# Patient Record
Sex: Female | Born: 1950 | Race: Black or African American | Hispanic: No | Marital: Single | Smoking: Current every day smoker
Health system: Southern US, Community
[De-identification: ages and names within clinical notes are randomized; demographics above are authoritative.]

## PROBLEM LIST (undated history)

## (undated) DIAGNOSIS — Z720 Tobacco use: Secondary | ICD-10-CM

## (undated) DIAGNOSIS — K219 Gastro-esophageal reflux disease without esophagitis: Secondary | ICD-10-CM

## (undated) DIAGNOSIS — E785 Hyperlipidemia, unspecified: Secondary | ICD-10-CM

## (undated) DIAGNOSIS — J45909 Unspecified asthma, uncomplicated: Secondary | ICD-10-CM

## (undated) DIAGNOSIS — I10 Essential (primary) hypertension: Secondary | ICD-10-CM

## (undated) DIAGNOSIS — I82409 Acute embolism and thrombosis of unspecified deep veins of unspecified lower extremity: Secondary | ICD-10-CM

## (undated) DIAGNOSIS — I2510 Atherosclerotic heart disease of native coronary artery without angina pectoris: Secondary | ICD-10-CM

## (undated) DIAGNOSIS — J449 Chronic obstructive pulmonary disease, unspecified: Secondary | ICD-10-CM

## (undated) HISTORY — DX: Hyperlipidemia, unspecified: E78.5

## (undated) HISTORY — PX: ABDOMINAL SURGERY: SHX537

## (undated) HISTORY — DX: Unspecified asthma, uncomplicated: J45.909

## (undated) HISTORY — PX: CARPAL TUNNEL RELEASE: SHX101

## (undated) HISTORY — DX: Acute embolism and thrombosis of unspecified deep veins of unspecified lower extremity: I82.409

## (undated) HISTORY — DX: Gastro-esophageal reflux disease without esophagitis: K21.9

---

## 1997-12-15 ENCOUNTER — Ambulatory Visit (HOSPITAL_BASED_OUTPATIENT_CLINIC_OR_DEPARTMENT_OTHER): Admission: RE | Admit: 1997-12-15 | Discharge: 1997-12-15 | Payer: Self-pay | Admitting: Orthopedic Surgery

## 1998-11-17 ENCOUNTER — Ambulatory Visit (HOSPITAL_BASED_OUTPATIENT_CLINIC_OR_DEPARTMENT_OTHER): Admission: RE | Admit: 1998-11-17 | Discharge: 1998-11-17 | Payer: Self-pay | Admitting: Orthopedic Surgery

## 2000-04-24 ENCOUNTER — Other Ambulatory Visit: Admission: RE | Admit: 2000-04-24 | Discharge: 2000-04-24 | Payer: Self-pay | Admitting: *Deleted

## 2000-06-17 ENCOUNTER — Encounter (HOSPITAL_BASED_OUTPATIENT_CLINIC_OR_DEPARTMENT_OTHER): Payer: Self-pay | Admitting: General Surgery

## 2000-06-19 ENCOUNTER — Encounter (INDEPENDENT_AMBULATORY_CARE_PROVIDER_SITE_OTHER): Payer: Self-pay | Admitting: Specialist

## 2000-06-19 ENCOUNTER — Ambulatory Visit (HOSPITAL_COMMUNITY): Admission: RE | Admit: 2000-06-19 | Discharge: 2000-06-19 | Payer: Self-pay | Admitting: General Surgery

## 2001-12-30 ENCOUNTER — Other Ambulatory Visit: Admission: RE | Admit: 2001-12-30 | Discharge: 2001-12-30 | Payer: Self-pay | Admitting: *Deleted

## 2002-01-01 ENCOUNTER — Encounter: Payer: Self-pay | Admitting: *Deleted

## 2002-01-01 ENCOUNTER — Encounter: Admission: RE | Admit: 2002-01-01 | Discharge: 2002-01-01 | Payer: Self-pay | Admitting: *Deleted

## 2002-01-14 ENCOUNTER — Encounter: Admission: RE | Admit: 2002-01-14 | Discharge: 2002-04-14 | Payer: Self-pay | Admitting: *Deleted

## 2004-11-26 ENCOUNTER — Ambulatory Visit: Payer: Self-pay | Admitting: Cardiology

## 2004-11-26 ENCOUNTER — Inpatient Hospital Stay (HOSPITAL_COMMUNITY): Admission: EM | Admit: 2004-11-26 | Discharge: 2004-11-28 | Payer: Self-pay | Admitting: Emergency Medicine

## 2004-11-27 ENCOUNTER — Encounter: Payer: Self-pay | Admitting: Cardiology

## 2005-04-14 ENCOUNTER — Emergency Department (HOSPITAL_COMMUNITY): Admission: EM | Admit: 2005-04-14 | Discharge: 2005-04-14 | Payer: Self-pay | Admitting: Family Medicine

## 2005-11-06 ENCOUNTER — Emergency Department (HOSPITAL_COMMUNITY): Admission: EM | Admit: 2005-11-06 | Discharge: 2005-11-06 | Payer: Self-pay | Admitting: Family Medicine

## 2007-01-03 ENCOUNTER — Encounter: Admission: RE | Admit: 2007-01-03 | Discharge: 2007-01-03 | Payer: Self-pay | Admitting: Family Medicine

## 2007-02-26 ENCOUNTER — Emergency Department (HOSPITAL_COMMUNITY): Admission: EM | Admit: 2007-02-26 | Discharge: 2007-02-26 | Payer: Self-pay | Admitting: *Deleted

## 2007-03-04 ENCOUNTER — Encounter: Admission: RE | Admit: 2007-03-04 | Discharge: 2007-03-04 | Payer: Self-pay | Admitting: Family Medicine

## 2007-03-05 ENCOUNTER — Encounter: Admission: RE | Admit: 2007-03-05 | Discharge: 2007-03-05 | Payer: Self-pay | Admitting: Family Medicine

## 2007-06-25 ENCOUNTER — Encounter: Admission: RE | Admit: 2007-06-25 | Discharge: 2007-06-25 | Payer: Self-pay | Admitting: Occupational Medicine

## 2007-08-03 ENCOUNTER — Emergency Department (HOSPITAL_COMMUNITY): Admission: EM | Admit: 2007-08-03 | Discharge: 2007-08-04 | Payer: Self-pay | Admitting: Emergency Medicine

## 2008-02-09 ENCOUNTER — Emergency Department (HOSPITAL_COMMUNITY): Admission: EM | Admit: 2008-02-09 | Discharge: 2008-02-09 | Payer: Self-pay | Admitting: Emergency Medicine

## 2008-06-10 ENCOUNTER — Emergency Department (HOSPITAL_COMMUNITY): Admission: EM | Admit: 2008-06-10 | Discharge: 2008-06-10 | Payer: Self-pay | Admitting: Emergency Medicine

## 2010-01-16 ENCOUNTER — Emergency Department (HOSPITAL_COMMUNITY): Admission: EM | Admit: 2010-01-16 | Discharge: 2010-01-16 | Payer: Self-pay | Admitting: Family Medicine

## 2010-01-16 ENCOUNTER — Inpatient Hospital Stay (HOSPITAL_COMMUNITY): Admission: EM | Admit: 2010-01-16 | Discharge: 2010-01-26 | Payer: Self-pay | Admitting: Emergency Medicine

## 2010-01-17 ENCOUNTER — Encounter (INDEPENDENT_AMBULATORY_CARE_PROVIDER_SITE_OTHER): Payer: Self-pay

## 2010-02-07 ENCOUNTER — Encounter: Admission: RE | Admit: 2010-02-07 | Discharge: 2010-02-07 | Payer: Self-pay | Admitting: Family Medicine

## 2010-08-20 ENCOUNTER — Encounter: Payer: Self-pay | Admitting: Family Medicine

## 2010-10-15 LAB — BASIC METABOLIC PANEL
BUN: 2 mg/dL — ABNORMAL LOW (ref 6–23)
BUN: 3 mg/dL — ABNORMAL LOW (ref 6–23)
CO2: 25 mEq/L (ref 19–32)
CO2: 28 mEq/L (ref 19–32)
CO2: 28 mEq/L (ref 19–32)
CO2: 32 mEq/L (ref 19–32)
Calcium: 8.3 mg/dL — ABNORMAL LOW (ref 8.4–10.5)
Calcium: 8.6 mg/dL (ref 8.4–10.5)
Calcium: 8.7 mg/dL (ref 8.4–10.5)
Calcium: 9.2 mg/dL (ref 8.4–10.5)
Chloride: 99 mEq/L (ref 96–112)
Creatinine, Ser: 0.71 mg/dL (ref 0.4–1.2)
Creatinine, Ser: 0.79 mg/dL (ref 0.4–1.2)
Creatinine, Ser: 0.8 mg/dL (ref 0.4–1.2)
Creatinine, Ser: 0.91 mg/dL (ref 0.4–1.2)
GFR calc Af Amer: >60 mL/min (ref >60)
GFR calc Af Amer: >60 mL/min (ref >60)
GFR calc Af Amer: >60 mL/min (ref >60)
GFR calc non Af Amer: >60 mL/min (ref >60)
GFR calc non Af Amer: >60 mL/min (ref >60)
GFR calc non Af Amer: >60 mL/min (ref >60)
Glucose, Bld: 102 mg/dL — ABNORMAL HIGH (ref 70–99)
Glucose, Bld: 83 mg/dL (ref 70–99)
Glucose, Bld: 84 mg/dL (ref 70–99)
Glucose, Bld: 95 mg/dL (ref 70–99)
Potassium: 3.2 mEq/L — ABNORMAL LOW (ref 3.5–5.1)
Potassium: 3.9 mEq/L (ref 3.5–5.1)
Sodium: 133 mEq/L — ABNORMAL LOW (ref 135–145)
Sodium: 137 mEq/L (ref 135–145)
Sodium: 139 mEq/L (ref 135–145)
Sodium: 139 mEq/L (ref 135–145)

## 2010-10-15 LAB — DIFFERENTIAL
Basophils Absolute: 0 10*3/uL (ref 0.0–0.1)
Basophils Absolute: 0 10*3/uL (ref 0.0–0.1)
Basophils Relative: 0 % (ref 0–1)
Basophils Relative: 1 % (ref 0–1)
Lymphocytes Relative: 18 % (ref 12–46)
Lymphocytes Relative: 54 % — ABNORMAL HIGH (ref 12–46)
Monocytes Absolute: 0.5 10*3/uL (ref 0.1–1.0)
Monocytes Relative: 8 % (ref 3–12)
Neutro Abs: 2.2 10*3/uL (ref 1.7–7.7)
Neutro Abs: 5.2 10*3/uL (ref 1.7–7.7)
Neutrophils Relative %: 36 % — ABNORMAL LOW (ref 43–77)
Neutrophils Relative %: 81 % — ABNORMAL HIGH (ref 43–77)

## 2010-10-15 LAB — CBC
HCT: 31.7 % — ABNORMAL LOW (ref 36.0–46.0)
HCT: 35.8 % — ABNORMAL LOW (ref 36.0–46.0)
Hemoglobin: 10.8 g/dL — ABNORMAL LOW (ref 12.0–15.0)
Hemoglobin: 11.4 g/dL — ABNORMAL LOW (ref 12.0–15.0)
Hemoglobin: 11.7 g/dL — ABNORMAL LOW (ref 12.0–15.0)
Hemoglobin: 13 g/dL (ref 12.0–15.0)
Hemoglobin: 14.1 g/dL (ref 12.0–15.0)
MCH: 29.1 pg (ref 26.0–34.0)
MCH: 29.3 pg (ref 26.0–34.0)
MCH: 29.6 pg (ref 26.0–34.0)
MCHC: 33.5 g/dL (ref 30.0–36.0)
MCHC: 33.6 g/dL (ref 30.0–36.0)
MCHC: 33.8 g/dL (ref 30.0–36.0)
MCHC: 34 g/dL (ref 30.0–36.0)
MCHC: 34 g/dL (ref 30.0–36.0)
MCV: 86.5 fL (ref 78.0–100.0)
MCV: 87.2 fL (ref 78.0–100.0)
Platelets: 275 10*3/uL (ref 150–400)
Platelets: 397 10*3/uL (ref 150–400)
RBC: 4 MIL/uL (ref 3.87–5.11)
RBC: 4.1 MIL/uL (ref 3.87–5.11)
RBC: 4.46 MIL/uL (ref 3.87–5.11)
RBC: 4.81 MIL/uL (ref 3.87–5.11)
RDW: 13.1 % (ref 11.5–15.5)
RDW: 13.2 % (ref 11.5–15.5)
RDW: 13.9 % (ref 11.5–15.5)

## 2010-10-15 LAB — LACTIC ACID, PLASMA: Lactic Acid, Venous: 1 mmol/L (ref 0.5–2.2)

## 2010-10-15 LAB — POCT URINALYSIS DIP (DEVICE)
Bilirubin Urine: NEGATIVE
Glucose, UA: NEGATIVE mg/dL
Nitrite: NEGATIVE
Urobilinogen, UA: 1 mg/dL (ref 0.0–1.0)

## 2010-10-15 LAB — LIPASE, BLOOD: Lipase: 24 U/L (ref 11–59)

## 2010-10-22 ENCOUNTER — Ambulatory Visit (INDEPENDENT_AMBULATORY_CARE_PROVIDER_SITE_OTHER): Payer: 59

## 2010-10-22 ENCOUNTER — Inpatient Hospital Stay (INDEPENDENT_AMBULATORY_CARE_PROVIDER_SITE_OTHER)
Admission: RE | Admit: 2010-10-22 | Discharge: 2010-10-22 | Disposition: A | Discharge status: Home / Self Care | Payer: 59 | Source: Ambulatory Visit | Attending: Family Medicine | Admitting: Family Medicine

## 2010-10-22 DIAGNOSIS — M1990 Unspecified osteoarthritis, unspecified site: Secondary | ICD-10-CM

## 2010-12-15 NOTE — H&P (Signed)
NAME:  Vanessa Campos, Vanessa Campos NO.:  [id number]   MEDICAL RECORD NO.:  [id number]          PATIENT TYPE:  INP   LOCATION:  1827                         FACILITY:  [facility]   PHYSICIAN:  Gertha Calkin, M.D.DATE OF BIRTH:  1950/09/08   DATE OF ADMISSION:  11/26/2004  DATE OF DISCHARGE:                                HISTORY & PHYSICAL   PRIMARY CARE PHYSICIAN:  Talmadge Coventry, M.D.   This is a pleasant 60 year old African-American female with no known past  medical problems who presents with chest pain that began yesterday afternoon  while attending a birthday party of a friend.  She states the pain was  located between her breasts and radiated underneath and wrapped around into  her scapula areas.  She states they wrapped around bilaterally.  She denies  any nausea, palpitations, diaphoresis, or neck and jaw radiation during  these episodes of chest pain.  The pain lasted approximately 10 to 15  minutes, spontaneously resolved, and was not worsened by activity or  relieved with rest.  The patient denies any previous symptoms and states  that she has had episodes of indigestion, but this was different in quality.  The patient states she had another episode yesterday evening while she was  getting into bed, again relieved spontaneously.  This morning she came in to  be evaluated.   PAST MEDICAL HISTORY:  None other than tobacco ab use.   MEDICATIONS:  None.   ALLERGIES:  No known drug allergies.   FAMILY HISTORY:  Positive for MI in her mom when she was in her 29s.  Her  mother is still alive today.   SOCIAL HISTORY:  The patient is divorced with two children.  She has five  brothers and seven sisters.  No known medical illnesses in her siblings.  She worked in [employer] and states she does smoke tobacco, approximately  a pack a day for 20 years.  No alcohol abuse or illicit drug use per  patient.   REVIEW OF SYSTEMS:  Essentially negative except for  HPI.   PHYSICAL EXAMINATION:  VITAL SIGNS:  Temperature 98.2, blood pressure  126/74, pulse 64, respirations 20, 96% on room air.  GENERAL:  This is an obese African-American female in no acute distress.  HEENT:  Pupils equal, round, and reactive to light.  Muddy sclerae.  Extraocular movements are intact.  Oropharynx is without erythema or  exudate.  Mucous membranes are moist.  NECK:  Supple without mass, JVP, or bruits.  CARDIOVASCULAR:  Regular rate and rhythm.  No murmurs, rubs, or gallops.  CHEST:  Clear to auscultation bilaterally with good air movement.  ABDOMEN:  Obese, soft, nontender, nondistended.  Bowel sounds present.  EXTREMITIES:  Without clubbing, cyanosis, or edema.  NEUROLOGIC:  Cranial nerves II-XII intact.  Muscle strength symmetric in  upper and lower extremities.  Sensation is intact.   LABORATORY DATA AND OTHER STUDIES:  UA negative.  Sodium 137, potassium 3.5,  chloride 105, bicarb 27, glucose 100, BUN 9, creatinine 1.0.  Calcium 8.9.  LFTs are normal.  INR is 0.9, PTT 30.  D-dimer negative.  EKG was significant only for primary A-V block; otherwise, normal sinus  rhythm.  Other intervals are normal, and axis is normal.   Point-of-care markers x 2 negative.   Chest x-ray shows no acute processes and no cardiomegaly.   ASSESSMENT AND PLAN:  1.  Atypical chest pain.  2.  Obesity.  3.  Tobacco abuse.  4.  Gastroesophageal reflux disease.  5.  Deep vein thrombosis prophylaxis.   PLAN:  1.  Will admit to telemetry bed.  2.  Considering she has significant risk factors, will check serial enzymes      and EKGs.  3.  Will also obtain fasting lipid profile, TSH, and place heparin during      this hospitalization.  4.  I am not writing for a 2-D echocardiogram as it is rather atypical, but      if any of her enzymes return back positive, would check a 2-D      echocardiogram.  Also am not tempted to do a 2-D echocardiogram      considering she has no  cardiomegaly on chest x-ray.  5.  For tobacco abuse, will offer tobacco cessation as well as place her on      nicotine patch.  6.  Empiric PPI for possible underlying gastrointestinal-related causes for      her chest pain.  7.  Have empirically started her on beta blocker, aspirin, and morphine      p.r.n., but no heparin at this time.      JD/MEDQ  D:  11/26/2004  T:  11/26/2004  Job:  [id number]   cc:   Talmadge Coventry, M.D.  [address]  [place]  [place] [address]  Fax: [phone]

## 2010-12-15 NOTE — Discharge Summary (Signed)
Vanessa Campos, Vanessa Campos NO.:  [id number]   MEDICAL RECORD NO.:  [id number]          PATIENT TYPE:  INP   LOCATION:  4702                         FACILITY:  [facility]   PHYSICIAN:  Lonia Blood, M.D.      DATE OF BIRTH:  Apr 08, 1951   DATE OF ADMISSION:  11/26/2004  DATE OF DISCHARGE:  11/28/2004                                 DISCHARGE SUMMARY   PRIMARY CARE PHYSICIAN:  Dr. Rise Mu Mazzocchi.   DISCHARGE DIAGNOSES:  1.  Atypical chest pain, status post catheterization, nonischemic.  2.  Gastroesophageal reflux disease.  3.  Tobacco abuse.  4.  Dyslipidemia.  5.  Obesity.   DISCHARGE MEDICATIONS:  1.  Aspirin 325 mg daily.  2.  Protonix 40 mg daily.  3.  Zocor 20 mg daily.   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  She,  however, will be discharged later today, once she is stable.  The patient is  so far pain-free and she is to continue with lifestyle modification health-  wise.   PROCEDURES PERFORMED:  1.  Two-dimensional echocardiogram performed on Nov 27, 2004 that shows      normal left ventricular systolic function with ejection fraction of 55%      to 65%.  2.  Chest x-ray performed on November 26, 2004 shows no acute abnormality.  3.  Cardiac catheterization performed on Nov 28, 2004 that shows clean      coronaries with an ejection fraction of 60%, no evidence of ischemic      cardiomyopathy.   CONSULTATIONS:  [facility].   BRIEF HISTORY AND PHYSICAL:  This is a 60 year old African American female  who presented with severe retrosternal chest pain that has persisted and  relieved by nitroglycerin.  The patient has multiple risk factors for  coronary artery disease which include tobacco use, obesity, unknown  cholesterol status prior to admission as well as family history because her  mom had open heart surgery.  Her vitals were found to be stable in the ED,  but she was admitted for rule-out MI because of her risk  factors.   HOSPITAL COURSE:  PROBLEM #1 - CHEST PAIN:  The patient's chest pain was  atypical for cardiac disease with some typical symptoms as well.  The  patient's chest pain, for example, was relieved by nitroglycerin.  She was  subsequently started on beta blockers, sublingual nitroglycerin p.r.n. as  well as morphine for pain.  She also had workup as indicated above including  a 2-D echo on arrival.  Fasting lipid panel was checked as well that shows  evidence of dyslipidemia.  Her cardiac enzymes were checked sequentially;  the CK was elevated at 233, 227 and 212; however, her MB was only 1.9, 2.0  and 1.6, and troponin was negative.  Subsequently, risk stratification was  considered by consulting Cardiology.  The patient was given the option of  either going for the stress test or to cath due to her risk factors; she  opted for the cath and that was performed on Nov 28, 2004 and it  shows clean  coronaries with no evidence of coronary artery disease.  Accordingly, the  patient is being placed mainly on aspirin and treating her for her  dyslipidemia.  She is currently chest-pain-free.   PROBLEM #2 - DYSLIPIDEMIA:  Fasting lipid panel shows total cholesterol of  248, triglycerides 87, HDL 42, but LDL 189.  The patient was initiated on  Zocor 20 mg daily at this time.  She will have her labs monitored as an  outpatient and further adjustments to be done by Dr. Smith Mince.   PROBLEM #3 - TOBACCO ABUSE:  The patient smokes about a pack per day.  She  apparently quit before, but went back to smoking.  She was excessively  counseled this time around and she says she has tried to quit again.  She  received a nicotine patch while in the hospital and she has offered nicotine  patch also upon discharge.   PROBLEM #4 - OBESITY:  The patient was counseled excessively on that.   PROBLEM #5 - GASTROESOPHAGEAL REFLUX DISEASE:  The patient's symptoms seemed  to be more like GERD-related as well.   She did very well with PPI in the  hospital; as such, we are sending her home on Protonix.   DISCHARGE LABORATORIES:  Her labs on the day of discharge include white  count of 5.4, hemoglobin 13.4, platelets 288,000; sodium 143, potassium 3.8,  chloride 106, CO2 28, BUN 8, creatinine 0.9 and glucose 96.      LG/MEDQ  D:  11/28/2004  T:  11/28/2004  Job:  [id number]   cc:   Talmadge Coventry, M.D.  [address]  [place]  [place] [address]  Fax: [phone]

## 2010-12-15 NOTE — Cardiovascular Report (Signed)
NAME:  Vanessa Campos, Vanessa Campos NO.:  [id number]   MEDICAL RECORD NO.:  [id number]          PATIENT TYPE:  INP   LOCATION:  4702                         FACILITY:  [facility]   PHYSICIAN:  Charlies Constable, M.D. [facility] DATE OF BIRTH:  1951/04/14   DATE OF PROCEDURE:  11/28/2004  DATE OF DISCHARGE:                              CARDIAC CATHETERIZATION   CLINICAL HISTORY:  [name] is 60 years old and has no prior history of  known heart disease.  She is a smoker.  She was admitted with chest pain  that was suggestive of unstable angina and seen in consultation by Arturo Morton.  Riley Kill, M.D., who recommended evaluation with cardiac catheterization.   DESCRIPTION OF PROCEDURE:  The procedure was performed via the right femoral  artery using arterial sheath and 6 French preformed coronary catheters.  A  frontal arterial puncture was performed and Omnipaque contrast was used.  The right femoral artery was not suitable for closure.  The patient  tolerated the procedure well and left the laboratory in satisfactory  condition.   RESULTS:  The aortic pressure was 118/67 with a mean of 88 and the left  ventricular pressure was 118/19.   Left main coronary artery:  The left main coronary artery was free of  significant disease.   Left anterior descending:  The left anterior descending gave rise to three  septal perforators and a large and small diagonal branch.  These and the LAD  proper were free of significant disease.   Circumflex artery:  The circumflex artery rise to a large ramus branch and  an AV branch which terminated into two posterolateral branches.  These  vessels were free of significant disease.  The ramus branch filled a vessel  that appeared.  It appeared to line the inferior wall.  It was not clear  whether this was a collateral or an extension of the ramus branch.  There  was no area in the right coronary artery that was identified as being  occluded.  This was also a very  small vessel.   Right coronary artery:  The right coronary artery was a small vessel.  It  gave rise to a conus branch, two right ventricular branches and a posterior  descending branch.  These vessels were free of significant disease.   Left ventriculogram:  The left ventriculogram performed in the ROA  projection showed good wall motion with no areas of hypokinesis.  The  estimated ejection fraction was 60%.   CONCLUSION:  Normal coronary angiography and left ventricular function.   RECOMMENDATIONS:  Reassurance.  In view of these findings, I think that the  patient's recent symptoms are not likely cardiac in etiology.      BB/MEDQ  D:  11/28/2004  T:  11/28/2004  Job:  [id number]   cc:   [facility]   Talmadge Coventry, M.D.  [address]  [place]  [place] [address]  Fax: [phone]

## 2010-12-15 NOTE — Consult Note (Signed)
NAMEMarland Kitchen  KEYNA, BLIZARD NO.:  [id number]   MEDICAL RECORD NO.:  [id number]          PATIENT TYPE:  INP   LOCATION:  4702                         FACILITY:  [facility]   PHYSICIAN:  Arturo Morton. Riley Kill, M.D. [facility] OF BIRTH:  19-Nov-1950   DATE OF CONSULTATION:  11/27/2004  DATE OF DISCHARGE:                                   CONSULTATION   REFERRED BY:  [facility].   REASON FOR CONSULTATION:  Ms. Laplante is a 60 year old female with no prior  cardiac history, but with multiple cardiac risk factors, notable for  longstanding tobacco, history of hypercholesterolemia, and family history,  who now presents with new onset chest discomfort.   The patient reports recent development of mild, substernal chest discomfort  associated with exertion.  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  This past Saturday afternoon, while just standing during a  birthday party, she developed the worst episode of chest discomfort to date  (9/10); she described this as sharp, mid sternal, with radiation to the  under side of both breasts.  This lasted approximately 20 minutes and was  associated with mild shortness of breath.  Following a recurrence later at  home, she became concerned and was taken by a family member to the emergency  room.   On presentation, the patient was treated with nitroglycerin tablet with  reported relief.  However, she has had recurrence since admission.  Electrocardiogram, however, are negative as are initial cardiac markers.   ALLERGIES:  No known drug allergies.   MEDICATIONS PRIOR TO ADMISSION:  None.   PAST MEDICAL HISTORY:  1.  Hypercholesterolemia.  2.  History of benign right breast biopsy.  3.  Status post abdominal hernia repair.  4.  Status post tubal ligation.  5.  Status post bilateral carpal tunnel surgery.   SOCIAL HISTORY:  The patient lives in  [place] with her son.  She works  for the [employer] in [employer], theatre/television/film.  She has a 20-pack-year  history of tobacco.  She denies alcohol use.   FAMILY HISTORY:  Mother, age 57, status post MI/CABG  approximately 10 years  ago.  Father deceased in his 18s secondary to cancer.  The patient has five  brothers and seven sisters, none of whom have coronary disease.   REVIEW OF SYSTEMS:  Reports only mild exertional dyspnea, but no orthopnea,  PND, or pedal edema.  Denies any evidence of upper or lower GI bleeding.  Denies any symptoms suggestive of reflux disease or any prior history of  peptic ulcer disease.  Remaining systems negative.   PHYSICAL EXAMINATION:  VITAL SIGNS:  Blood pressure 98/67, pulse 71,  respirations 20, temperature 97.0. SAO2 99% (2 liters).  GENERAL:  A 60 year old female in no apparent distress.  HEENT:  Normocephalic and atraumatic.  NECK:  Preserved carotid pulses without bruits.  LUNGS:  Clear to auscultation in all fields.  HEART:  Regular rate and rhythm (S1, S2).  No murmurs, rubs, or gallops.  ABDOMEN:  Soft, nontender.  Intact bowel sounds without bruits.  EXTREMITIES:  Preserved bilateral femoral pulses without bruits; intact  distal pulses with no edema.  NEUROLOGIC:  No focal deficit.   LABORATORY DATA AND OTHER STUDIES:  Chest x-ray:  No acute disease.   Electrocardiogram:  First degree atrioventricular block at 72 beats per  minute; normal axis; no evidence of any changes.   Laboratory data:  Hemoglobin 13.5, hematocrit 40, platelets 297, WBC 5.8.  Sodium 141, potassium 3.9, BUN 9, creatinine 0.9, glucose 87.  Liver enzymes  normal.  D-dimer 0.32.  Lipid profile: Total cholesterol 248, triglycerides  87, HDL 42, LDL 189.  TSH 1.9.  Cardiac enzymes: Elevated total CPK with  normal MBs; troponin I (point-of-care) less than 0.05 (x 3).   IMPRESSION:  1.  Chest pain (new onset).      1.  Typical/atypical features.      2.  Relieved  with nitroglycerin.  2.  Multiple cardiac risk factors.      1.  Tobacco.      2.  Dyslipidemia.      3.  Family history.  3.  First degree intraventricular block.   PLAN:  The patient presents with new onset chest discomfort in the setting  of multiple cardiac risk factors.  Although serial EKGs and cardiac markers  are negative, the patient's symptoms are worrisome for significant  underlying coronary artery disease, particularly in the setting of multiple  cardiac risk factors.  Therefore, recommendation is to proceed with  diagnostic cardiac catheterization which will be scheduled for tomorrow.  The patient is agreeable with this plan, and risks/benefits have been  discussed.      GS/MEDQ  D:  11/27/2004  T:  11/27/2004  Job:  [id number]   cc:   Talmadge Coventry, M.D.  [address]  [place]  [place] [address]  Fax: [phone]

## 2011-04-26 LAB — CBC
Platelets: 339
WBC: 7.7

## 2011-04-26 LAB — POCT I-STAT, CHEM 8
BUN: 10
Calcium, Ion: 1.2
Creatinine, Ser: 1
Sodium: 140
TCO2: 29

## 2011-04-26 LAB — HEPATIC FUNCTION PANEL
ALT: 16
Albumin: 3.7
Alkaline Phosphatase: 71
Total Protein: 7.3

## 2011-04-26 LAB — DIFFERENTIAL
Lymphocytes Relative: 42
Lymphs Abs: 3.2
Neutro Abs: 3.7
Neutrophils Relative %: 48

## 2011-04-26 LAB — LIPASE, BLOOD: Lipase: 24

## 2011-04-26 LAB — POCT URINALYSIS DIP (DEVICE)
Operator id: 282151
Protein, ur: NEGATIVE
Urobilinogen, UA: 0.2

## 2011-04-28 IMAGING — CR DG CHEST 2V
2 series · 2 of 2 positions shown · non-contrast
Comparison: 01/22/2010

CLINICAL DATA: Hernia repair.  Postop.

CHEST - 2 VIEW

[w chest pa]
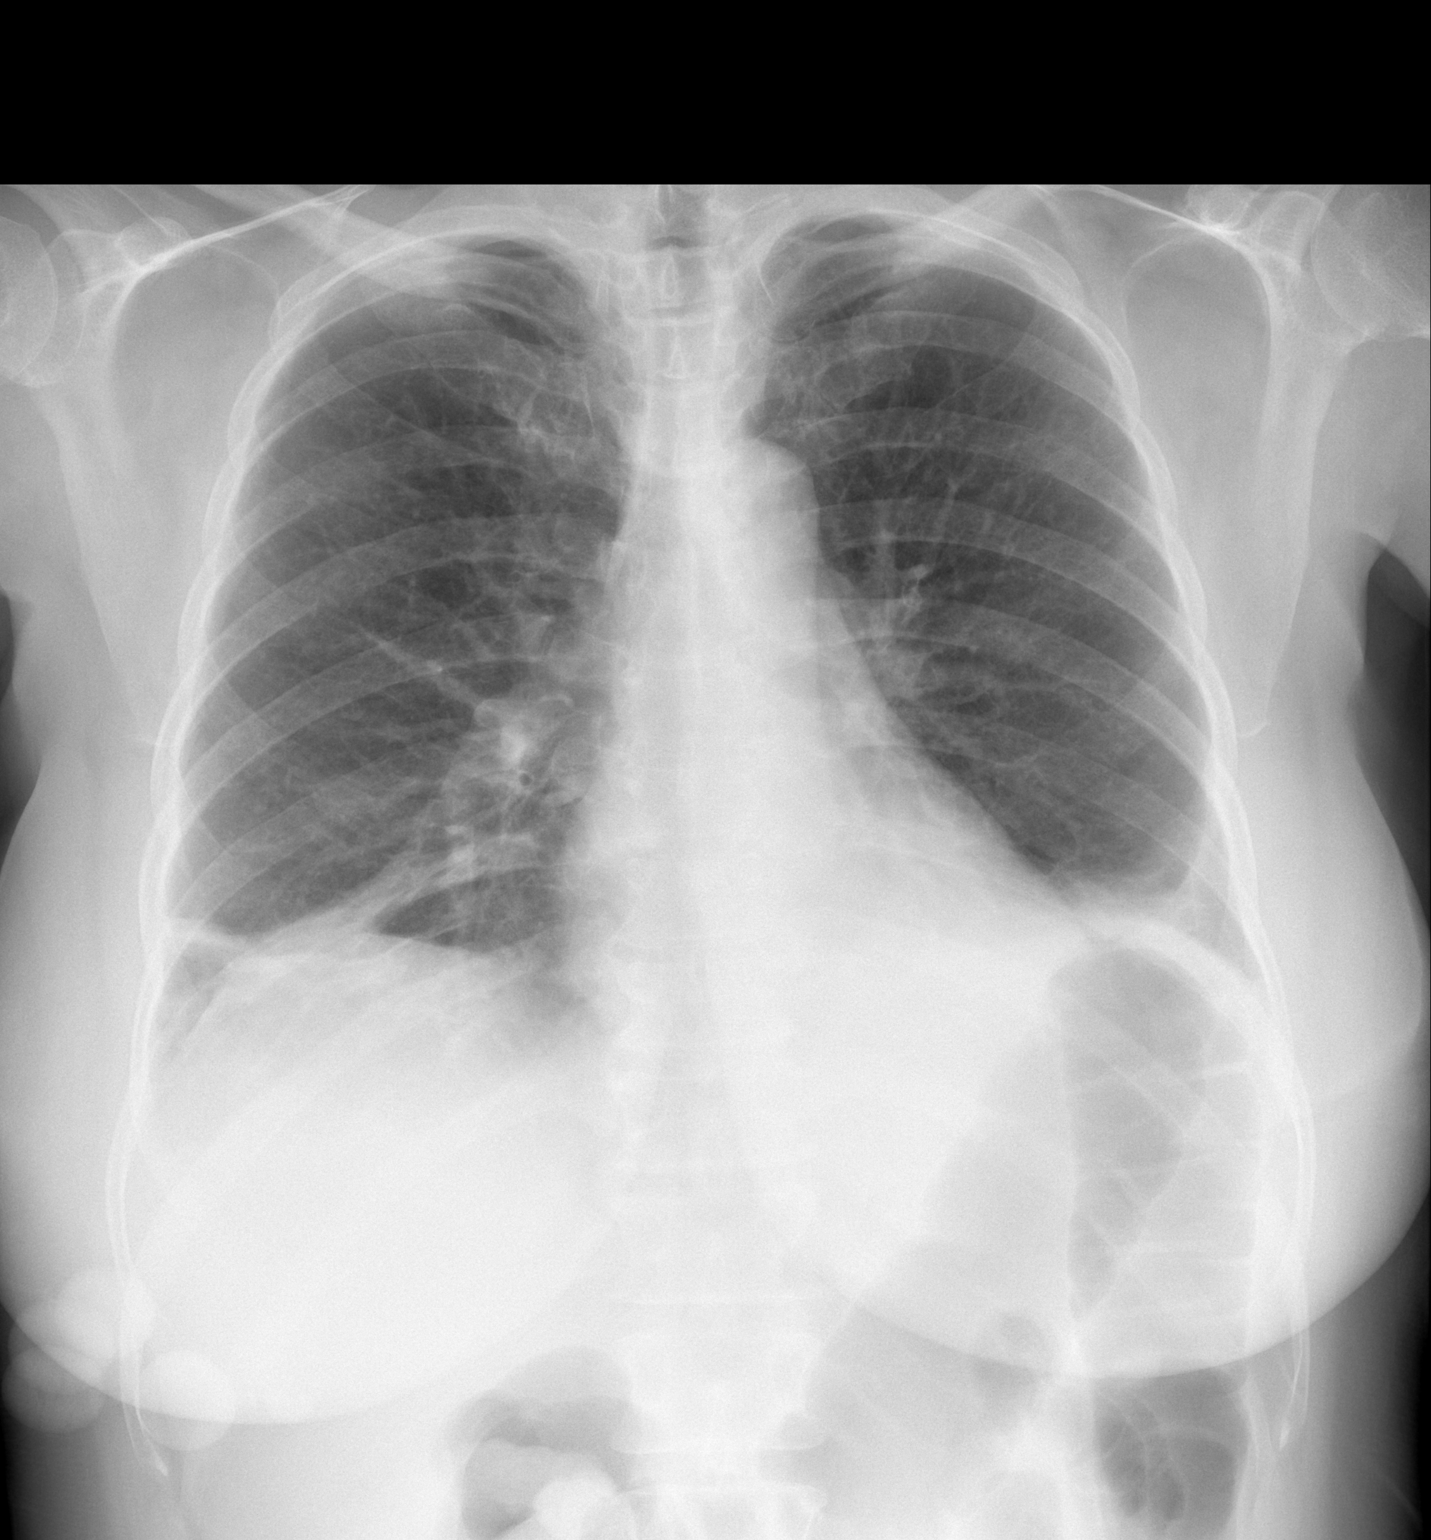

[w chest lat]
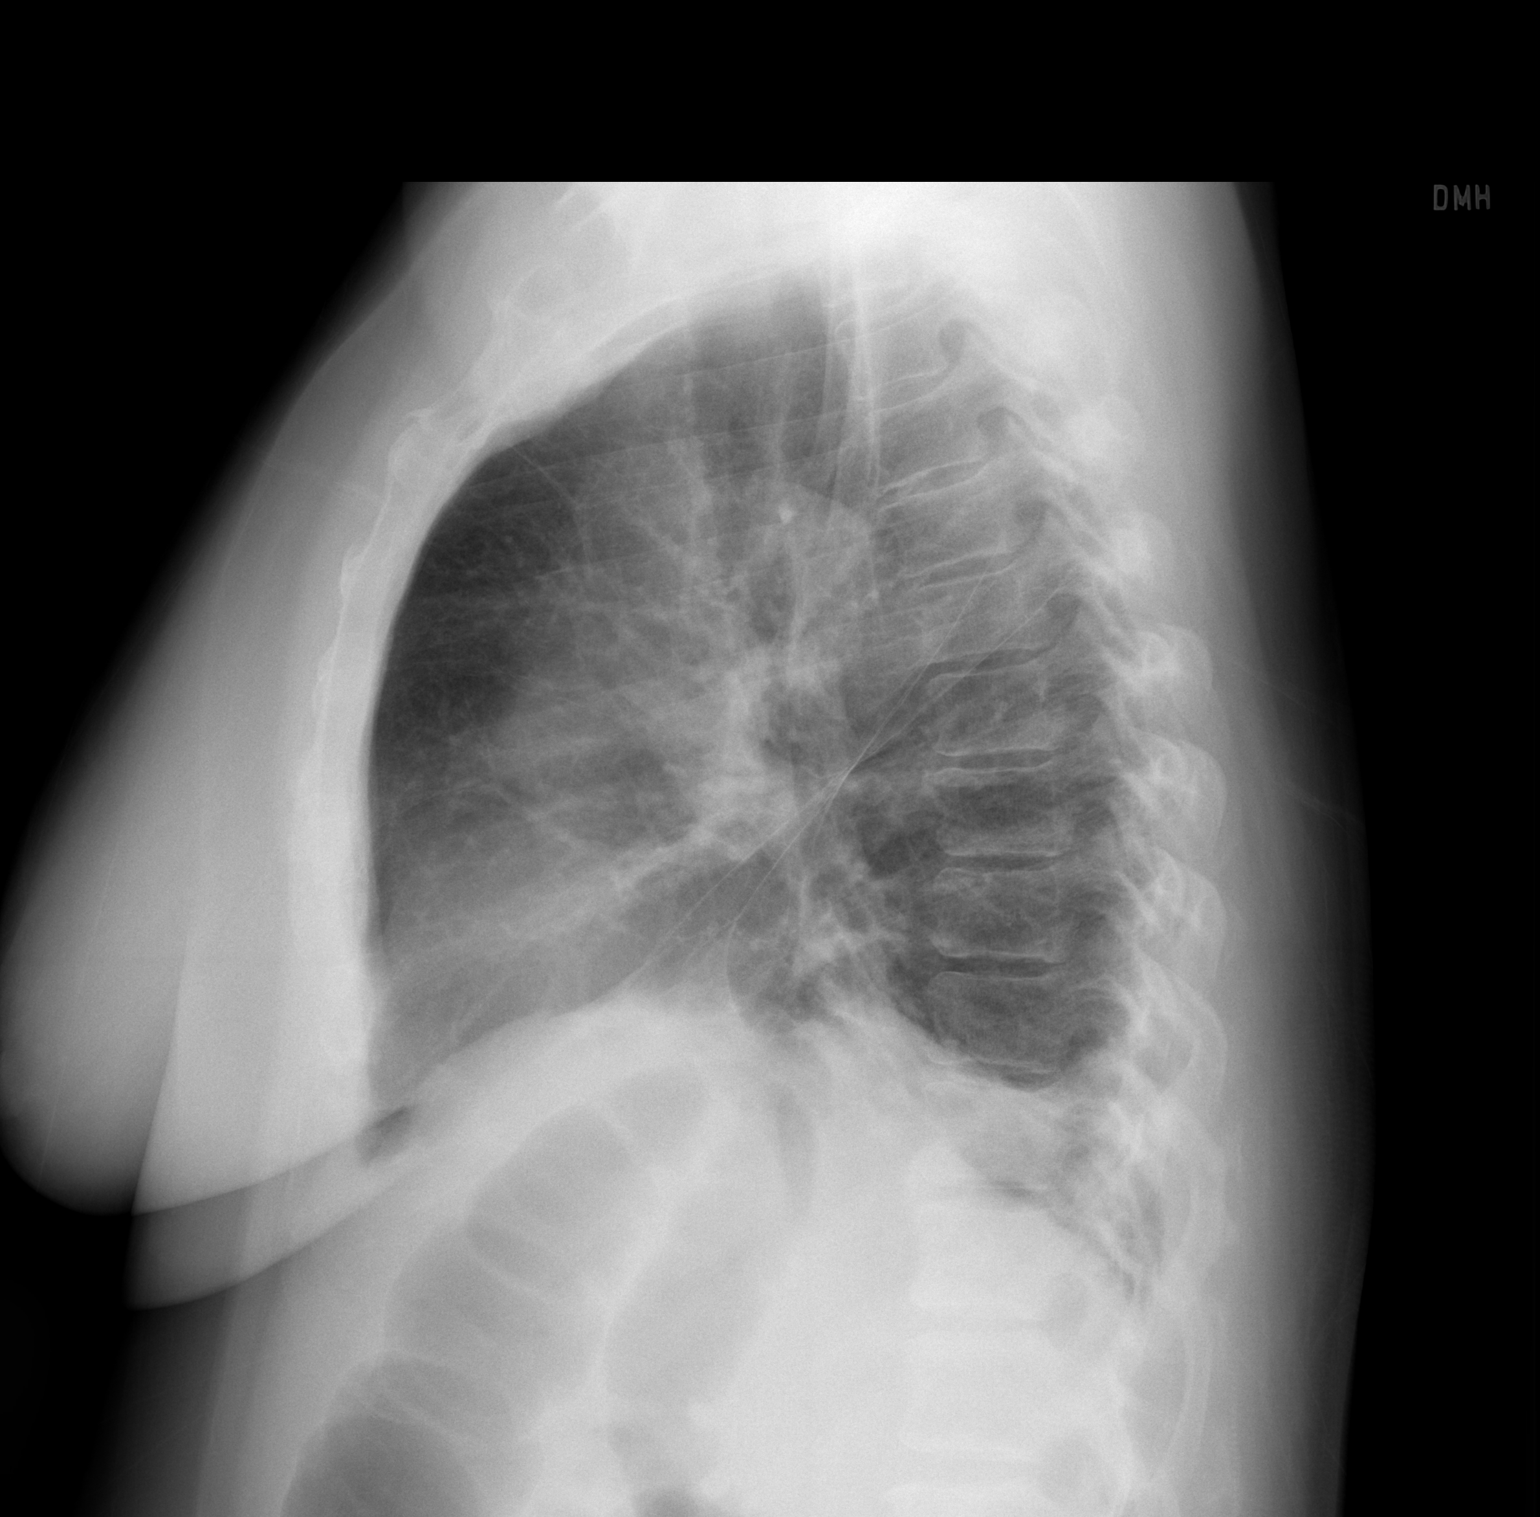

[2 of 2 positions shown; findings below may reference images not displayed]

FINDINGS: There is bibasilar atelectasis and possible small
effusions, similar to prior study.  Heart is upper limits normal in
size.  No acute bony abnormality.
IMPRESSION: Continued bibasilar atelectasis.  Question small effusions.

## 2011-04-29 IMAGING — CR DG CHEST 2V
2 series · 2 of 2 positions shown · non-contrast
Comparison: 01/25/2010

CLINICAL DATA: Status post repair of incarcerated ventral hernia.
Cough and congestion postoperatively.

CHEST - 2 VIEW

[w chest pa]
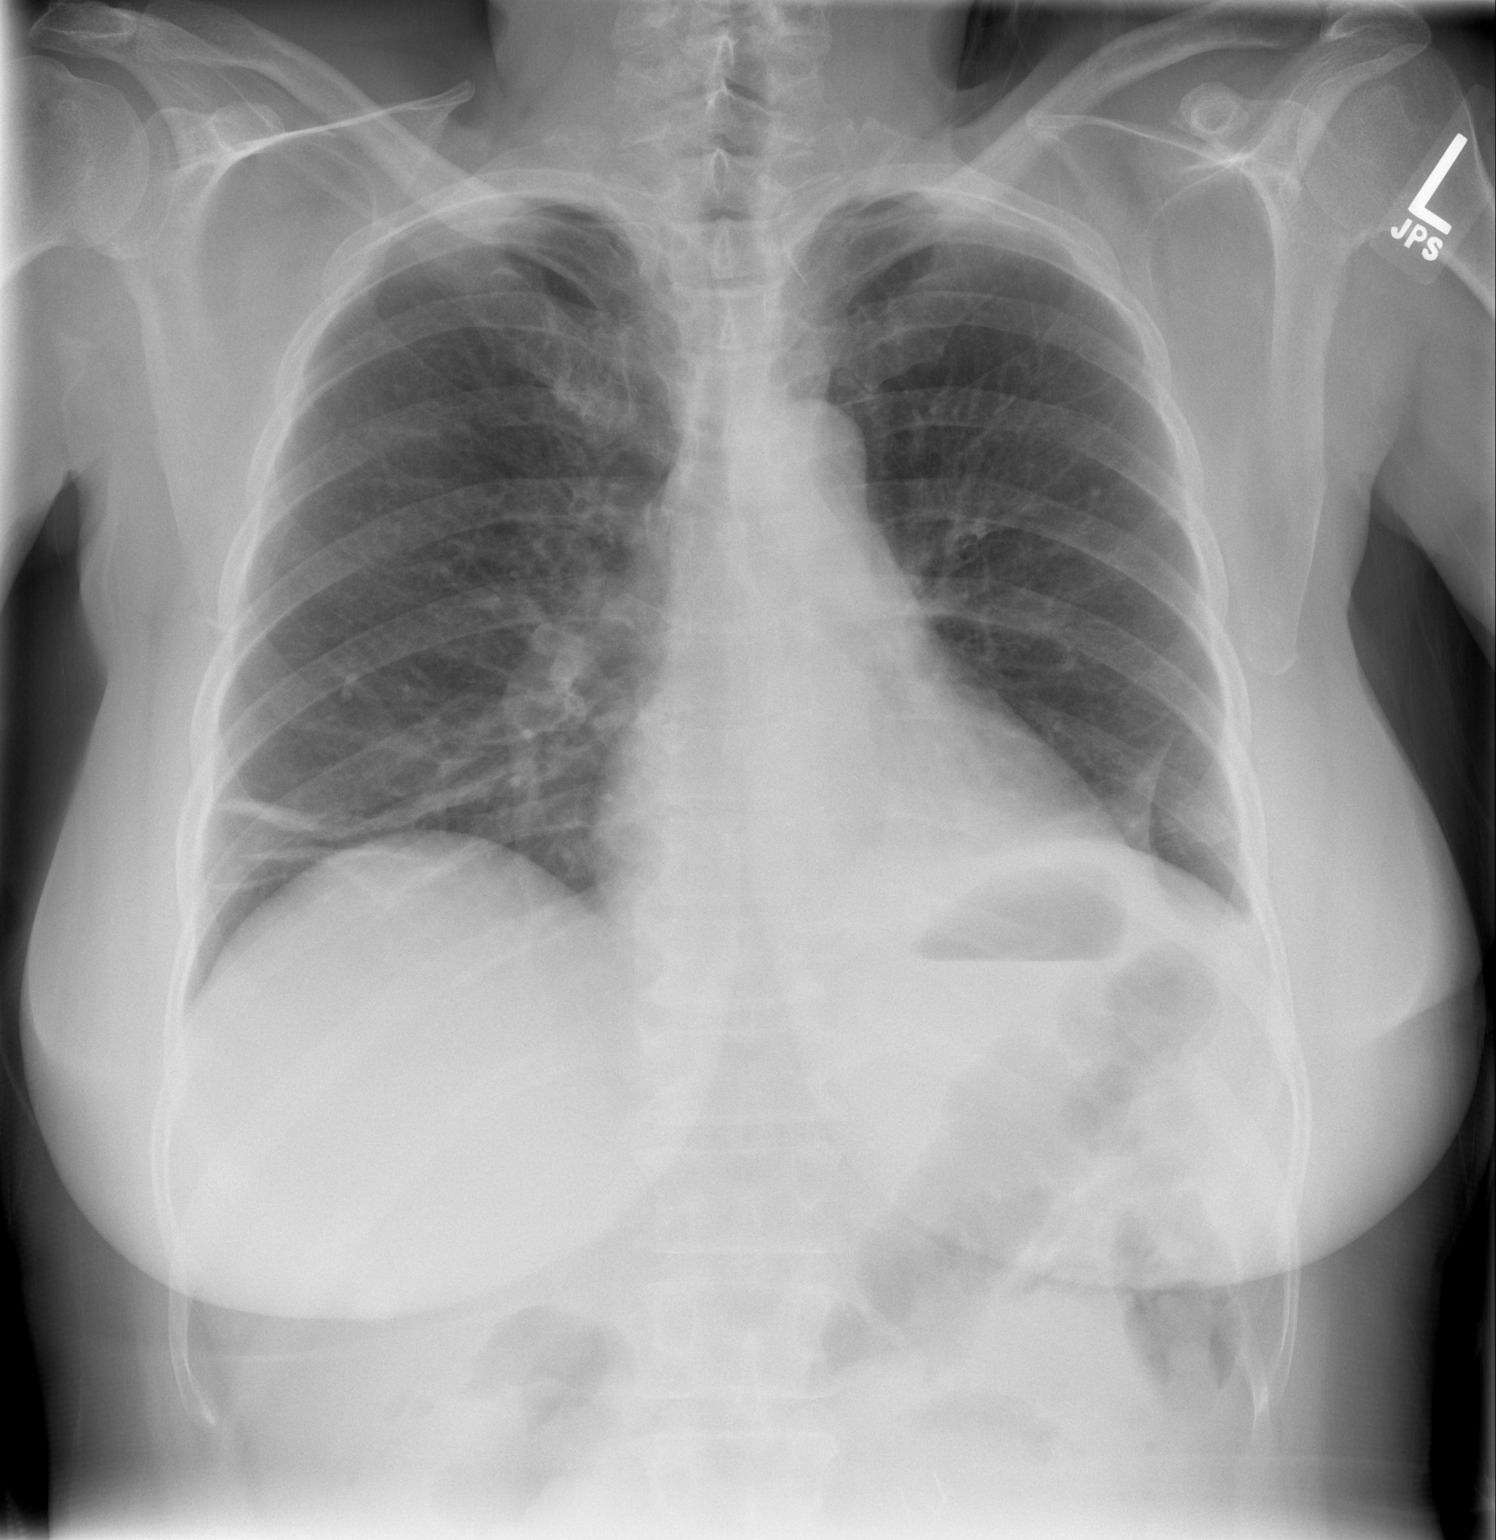

[w chest lat]
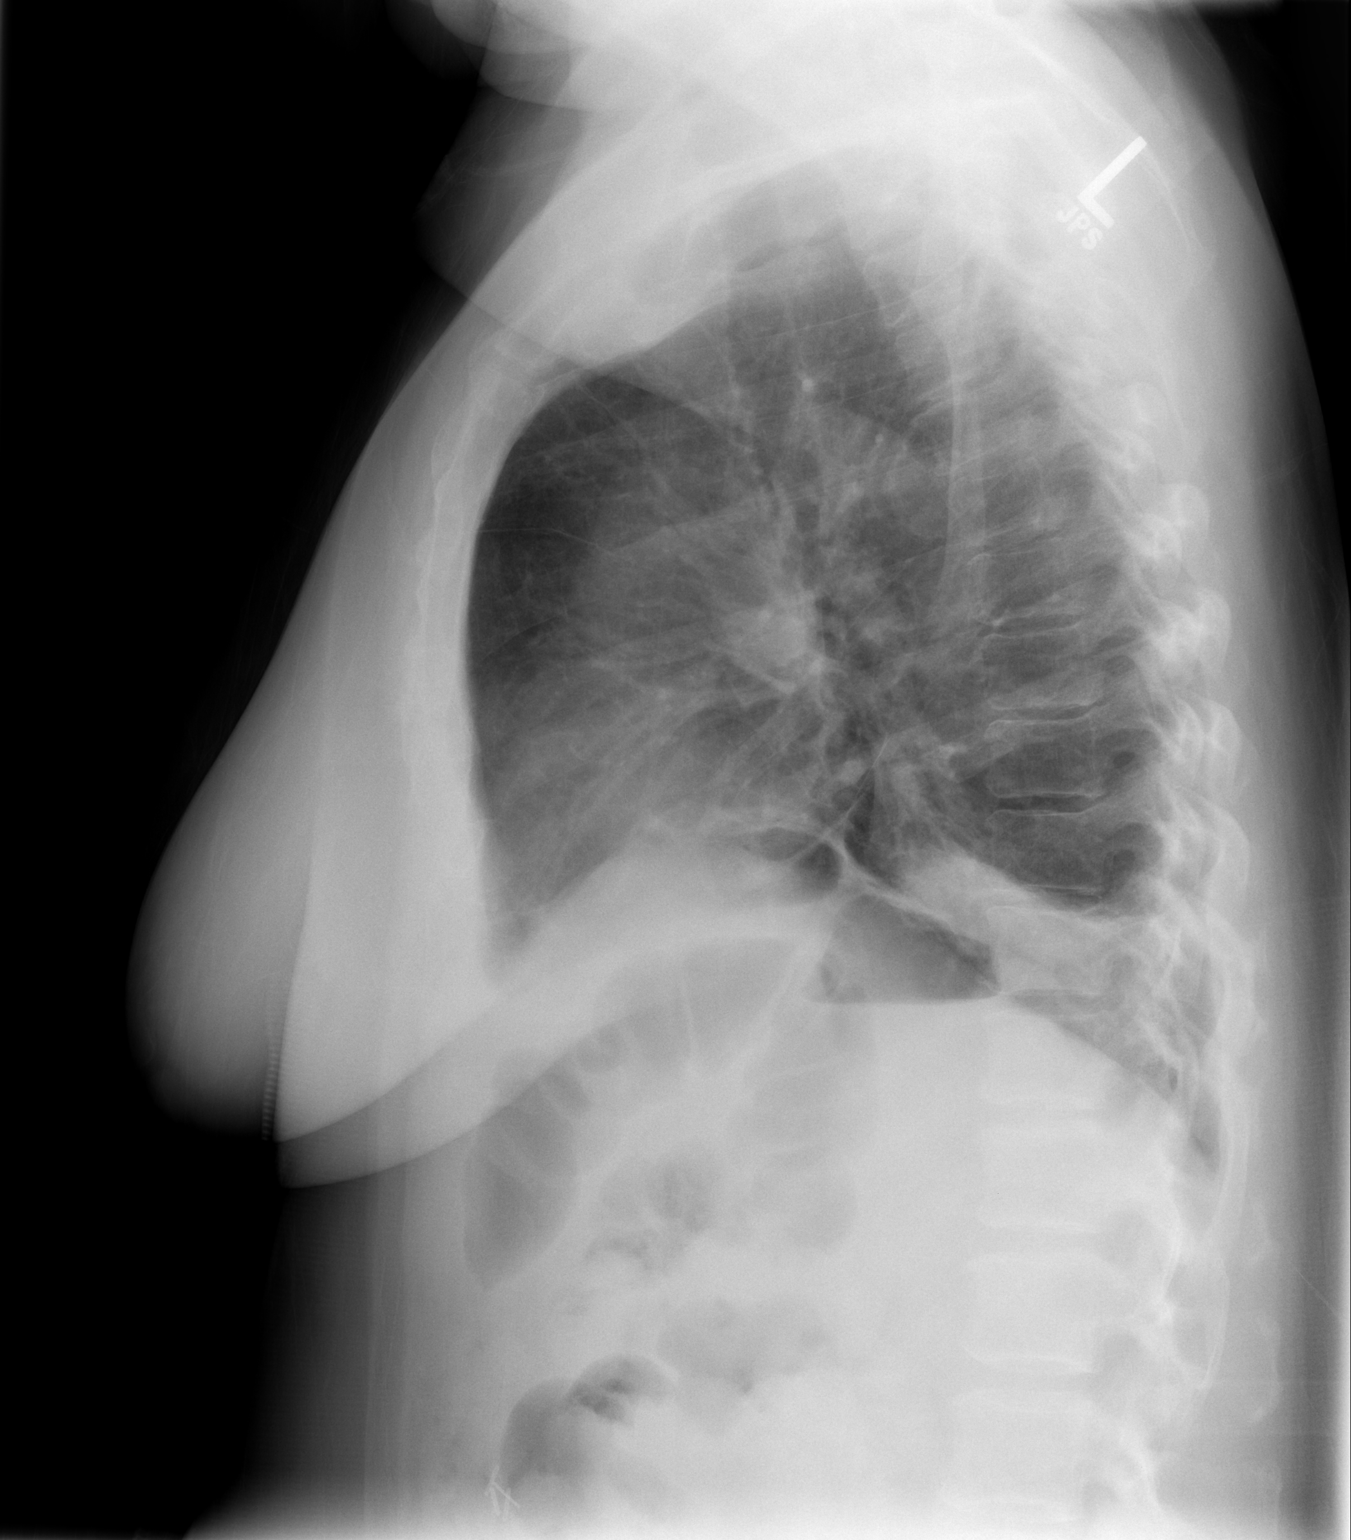

[2 of 2 positions shown; findings below may reference images not displayed]

FINDINGS: Bibasilar atelectasis improved since the prior study.
There is a small left pleural effusion.  No edema or infiltrate.
Heart size is normal.
IMPRESSION: Improved bibasilar atelectasis.  Small left pleural effusion.

## 2011-05-14 LAB — URINALYSIS, ROUTINE W REFLEX MICROSCOPIC
Bilirubin Urine: NEGATIVE
Glucose, UA: NEGATIVE
Hgb urine dipstick: NEGATIVE
Specific Gravity, Urine: 1.018
Urobilinogen, UA: 1

## 2011-05-14 LAB — COMPREHENSIVE METABOLIC PANEL
AST: 18
Albumin: 3.4 — ABNORMAL LOW
Alkaline Phosphatase: 67
Chloride: 107
GFR calc Af Amer: >60
Potassium: 3.4 — ABNORMAL LOW
Sodium: 141
Total Bilirubin: 0.5
Total Protein: 6.5

## 2011-05-14 LAB — CBC
HCT: 43.4
Hemoglobin: 14.4
RDW: 14.2 — ABNORMAL HIGH

## 2011-05-14 LAB — DIFFERENTIAL
Basophils Absolute: 0.1
Basophils Relative: 1
Eosinophils Relative: 3
Monocytes Absolute: 0.3
Monocytes Relative: 5

## 2011-06-30 ENCOUNTER — Emergency Department (HOSPITAL_COMMUNITY)
Admission: EM | Admit: 2011-06-30 | Discharge: 2011-06-30 | Disposition: A | Discharge status: Home / Self Care | Payer: 59 | Source: Home / Self Care

## 2011-06-30 DIAGNOSIS — L259 Unspecified contact dermatitis, unspecified cause: Secondary | ICD-10-CM

## 2011-06-30 MED ORDER — METHYLPREDNISOLONE ACETATE 80 MG/ML IJ SUSP
INTRAMUSCULAR | Status: AC
  Filled 2011-06-30: qty 1

## 2011-06-30 MED ORDER — TRIAMCINOLONE ACETONIDE 0.1 % EX CREA: TOPICAL_CREAM | CUTANEOUS | Status: DC

## 2011-06-30 MED ORDER — METHYLPREDNISOLONE ACETATE 80 MG/ML IJ SUSP
80.0000 mg | Freq: Once | INTRAMUSCULAR | Status: AC
  Administered 2011-06-30: 80 mg via INTRAMUSCULAR

## 2011-06-30 NOTE — ED Provider Notes (Signed)
History     CSN: [id number] Arrival date & time: 06/30/2011  7:07 PM   None     Chief Complaint  Patient presents with  . 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 Remembers yesterday brushing something away from her Lt eye area but does not recall getting anything in her eye. No eye pain or change in vision. Awoke this morning with Lt eye swollen and itching. Itching is worsening as day progresses. No drainage or watering. She sometimes feels like the Rt eye is beginning to itch also.   The history is provided by the patient.    History reviewed. No pertinent past medical history.  Past Surgical History  Procedure Date  . Abdominal surgery   . Carpal tunnel release     History reviewed. No pertinent family history.  History  Substance Use Topics  . Smoking status: Current Everyday Smoker -- 1.0 packs/day  . Smokeless tobacco: Not on file  . Alcohol Use: No    OB History    Grav Para Term Preterm Abortions TAB SAB Ect Mult Living                  Review of Systems  Constitutional: Negative for fever and chills.  HENT: Negative for ear pain, congestion, rhinorrhea and postnasal drip.   Eyes: Positive for itching. Negative for photophobia, pain, discharge, redness and visual disturbance.  Respiratory: Negative for cough and shortness of breath.   Cardiovascular: Negative for chest pain.    Allergies  Review of patient's allergies indicates no known allergies.  Home Medications   Current Outpatient Rx  Name Route Sig Dispense Refill  . TRIAMCINOLONE ACETONIDE 0.1 % EX CREA  Apply sparingly twice a day to left eye area for up to 7 days 15 g 0    BP 137/82  Pulse 67  Temp(Src) 98.4 F (36.9 C) (Oral)  Resp 16  SpO2  99%  Physical Exam  Nursing note and vitals reviewed. Constitutional: She appears well-developed and well-nourished. No distress.  HENT:  Head: Normocephalic and atraumatic.  Right Ear: Tympanic membrane, external ear and ear canal normal.  Left Ear: Tympanic membrane, external ear and ear canal normal.  Nose: Nose normal.  Mouth/Throat: Uvula is midline, oropharynx is clear and moist and mucous membranes are normal. No oropharyngeal exudate, posterior oropharyngeal edema or posterior oropharyngeal erythema.  Eyes: Conjunctivae and EOM are normal. Pupils are equal, round, and reactive to light. Right eye exhibits no discharge, no exudate and no hordeolum. No foreign body present in the right eye. Left eye exhibits no discharge, no exudate and no hordeolum. No foreign body present in the left eye. No scleral icterus.    Neck: Neck supple. No thyromegaly present.  Cardiovascular: Normal rate, regular rhythm and normal heart sounds.   Pulmonary/Chest: Effort normal and breath sounds normal. No respiratory distress.  Lymphadenopathy:    She has no cervical adenopathy.  Neurological: She is alert.  Skin: Skin is warm and dry.  Psychiatric: She has a normal mood and affect.    ED Course  Procedures (including critical care time)  Labs Reviewed - No data to display No results found.   1. Contact dermatitis       MDM          Vanessa Campos  Vidal Schwalbe, PA 06/30/11 2007

## 2011-06-30 NOTE — ED Provider Notes (Signed)
Medical screening examination/treatment/procedure(s) were performed by non-physician practitioner and as supervising physician I was immediately available for consultation/collaboration.  Corrie Mckusick, MD 06/30/11 2219

## 2012-07-30 DIAGNOSIS — I82409 Acute embolism and thrombosis of unspecified deep veins of unspecified lower extremity: Secondary | ICD-10-CM

## 2012-07-30 HISTORY — DX: Acute embolism and thrombosis of unspecified deep veins of unspecified lower extremity: I82.409

## 2012-10-05 ENCOUNTER — Emergency Department (HOSPITAL_COMMUNITY): Payer: 59

## 2012-10-05 ENCOUNTER — Encounter (HOSPITAL_COMMUNITY): Payer: Self-pay | Admitting: Emergency Medicine

## 2012-10-05 ENCOUNTER — Inpatient Hospital Stay (HOSPITAL_COMMUNITY)
Admission: EM | Admit: 2012-10-05 | Discharge: 2012-10-14 | DRG: 176 | Disposition: A | Discharge status: Home / Self Care | Payer: 59 | Attending: Internal Medicine | Admitting: Internal Medicine

## 2012-10-05 DIAGNOSIS — I959 Hypotension, unspecified: Secondary | ICD-10-CM | POA: Diagnosis not present

## 2012-10-05 DIAGNOSIS — I82402 Acute embolism and thrombosis of unspecified deep veins of left lower extremity: Secondary | ICD-10-CM

## 2012-10-05 DIAGNOSIS — E041 Nontoxic single thyroid nodule: Secondary | ICD-10-CM | POA: Diagnosis present

## 2012-10-05 DIAGNOSIS — R5081 Fever presenting with conditions classified elsewhere: Secondary | ICD-10-CM | POA: Diagnosis present

## 2012-10-05 DIAGNOSIS — M79605 Pain in left leg: Secondary | ICD-10-CM | POA: Diagnosis present

## 2012-10-05 DIAGNOSIS — I82409 Acute embolism and thrombosis of unspecified deep veins of unspecified lower extremity: Secondary | ICD-10-CM | POA: Diagnosis present

## 2012-10-05 DIAGNOSIS — F17200 Nicotine dependence, unspecified, uncomplicated: Secondary | ICD-10-CM | POA: Diagnosis present

## 2012-10-05 DIAGNOSIS — I2510 Atherosclerotic heart disease of native coronary artery without angina pectoris: Secondary | ICD-10-CM | POA: Diagnosis present

## 2012-10-05 DIAGNOSIS — R911 Solitary pulmonary nodule: Secondary | ICD-10-CM | POA: Diagnosis present

## 2012-10-05 DIAGNOSIS — I2699 Other pulmonary embolism without acute cor pulmonale: Principal | ICD-10-CM | POA: Diagnosis present

## 2012-10-05 DIAGNOSIS — I10 Essential (primary) hypertension: Secondary | ICD-10-CM | POA: Diagnosis present

## 2012-10-05 DIAGNOSIS — M79609 Pain in unspecified limb: Secondary | ICD-10-CM

## 2012-10-05 DIAGNOSIS — K5900 Constipation, unspecified: Secondary | ICD-10-CM | POA: Diagnosis present

## 2012-10-05 DIAGNOSIS — K219 Gastro-esophageal reflux disease without esophagitis: Secondary | ICD-10-CM | POA: Diagnosis present

## 2012-10-05 HISTORY — DX: Essential (primary) hypertension: I10

## 2012-10-05 HISTORY — DX: Atherosclerotic heart disease of native coronary artery without angina pectoris: I25.10

## 2012-10-05 LAB — POCT I-STAT TROPONIN I: Troponin i, poc: 0 ng/mL (ref 0.00–0.08)

## 2012-10-05 LAB — BASIC METABOLIC PANEL
BUN: 7 mg/dL (ref 6–23)
CO2: 25 mEq/L (ref 19–32)
Calcium: 9.2 mg/dL (ref 8.4–10.5)
Chloride: 100 mEq/L (ref 96–112)
Creatinine, Ser: 0.82 mg/dL (ref 0.50–1.10)
Glucose, Bld: 104 mg/dL — ABNORMAL HIGH (ref 70–99)

## 2012-10-05 LAB — CBC
HCT: 41.6 % (ref 36.0–46.0)
MCH: 28.6 pg (ref 26.0–34.0)
MCV: 82.5 fL (ref 78.0–100.0)
Platelets: 262 10*3/uL (ref 150–400)
RBC: 5.04 MIL/uL (ref 3.87–5.11)
WBC: 13.3 10*3/uL — ABNORMAL HIGH (ref 4.0–10.5)

## 2012-10-05 LAB — PROTIME-INR: INR: 1.13 (ref 0.00–1.49)

## 2012-10-05 MED ORDER — ACETAMINOPHEN 650 MG RE SUPP: 650.0000 mg | Freq: Four times a day (QID) | RECTAL | Status: DC | PRN

## 2012-10-05 MED ORDER — SODIUM CHLORIDE 0.9 % IV SOLN: 250.0000 mL | INTRAVENOUS | Status: DC | PRN

## 2012-10-05 MED ORDER — SODIUM CHLORIDE 0.9 % IJ SOLN
3.0000 mL | Freq: Two times a day (BID) | INTRAMUSCULAR | Status: DC
  Administered 2012-10-05 – 2012-10-13 (×10): 3 mL via INTRAVENOUS

## 2012-10-05 MED ORDER — HYDROMORPHONE HCL PF 1 MG/ML IJ SOLN
1.0000 mg | INTRAMUSCULAR | Status: DC | PRN
  Administered 2012-10-05 – 2012-10-07 (×8): 1 mg via INTRAVENOUS
  Filled 2012-10-05 (×8): qty 1

## 2012-10-05 MED ORDER — ENOXAPARIN SODIUM 100 MG/ML ~~LOC~~ SOLN
1.0000 mg/kg | Freq: Once | SUBCUTANEOUS | Status: DC
  Filled 2012-10-05: qty 1

## 2012-10-05 MED ORDER — OXYCODONE HCL 5 MG PO TABS
5.0000 mg | ORAL_TABLET | ORAL | Status: DC | PRN
  Administered 2012-10-06 – 2012-10-08 (×9): 5 mg via ORAL
  Filled 2012-10-05 (×9): qty 1

## 2012-10-05 MED ORDER — ONDANSETRON HCL 4 MG PO TABS: 4.0000 mg | ORAL_TABLET | Freq: Four times a day (QID) | ORAL | Status: DC | PRN

## 2012-10-05 MED ORDER — IOHEXOL 350 MG/ML SOLN
100.0000 mL | Freq: Once | INTRAVENOUS | Status: AC | PRN
  Administered 2012-10-05: 100 mL via INTRAVENOUS

## 2012-10-05 MED ORDER — HYDROMORPHONE HCL PF 1 MG/ML IJ SOLN
1.0000 mg | Freq: Once | INTRAMUSCULAR | Status: AC
  Administered 2012-10-05: 1 mg via INTRAVENOUS
  Filled 2012-10-05: qty 1

## 2012-10-05 MED ORDER — ENOXAPARIN SODIUM 80 MG/0.8ML ~~LOC~~ SOLN
75.0000 mg | SUBCUTANEOUS | Status: AC
  Administered 2012-10-05: 75 mg via SUBCUTANEOUS
  Filled 2012-10-05: qty 0.8

## 2012-10-05 MED ORDER — BISACODYL 5 MG PO TBEC: 10.0000 mg | DELAYED_RELEASE_TABLET | Freq: Every day | ORAL | Status: DC | PRN

## 2012-10-05 MED ORDER — ONDANSETRON HCL 4 MG/2ML IJ SOLN: 4.0000 mg | Freq: Four times a day (QID) | INTRAMUSCULAR | Status: DC | PRN

## 2012-10-05 MED ORDER — ONDANSETRON HCL 4 MG/2ML IJ SOLN
4.0000 mg | Freq: Once | INTRAMUSCULAR | Status: AC
Start: 2012-10-05 — End: 2012-10-05
  Administered 2012-10-05: 4 mg via INTRAVENOUS
  Filled 2012-10-05: qty 2

## 2012-10-05 MED ORDER — SODIUM CHLORIDE 0.9 % IJ SOLN
3.0000 mL | INTRAMUSCULAR | Status: DC | PRN
  Administered 2012-10-09: 3 mL via INTRAVENOUS

## 2012-10-05 MED ORDER — WARFARIN - PHARMACIST DOSING INPATIENT
Freq: Every day | Status: DC
  Administered 2012-10-08: 18:00:00

## 2012-10-05 MED ORDER — WARFARIN SODIUM 7.5 MG PO TABS
7.5000 mg | ORAL_TABLET | Freq: Once | ORAL | Status: AC
  Administered 2012-10-05: 7.5 mg via ORAL
  Filled 2012-10-05: qty 1

## 2012-10-05 MED ORDER — LEVALBUTEROL HCL 0.63 MG/3ML IN NEBU
0.6300 mg | INHALATION_SOLUTION | RESPIRATORY_TRACT | Status: DC | PRN
  Administered 2012-10-05 – 2012-10-06 (×4): 0.63 mg via RESPIRATORY_TRACT
  Filled 2012-10-05 (×3): qty 3

## 2012-10-05 MED ORDER — ENOXAPARIN SODIUM 80 MG/0.8ML ~~LOC~~ SOLN
1.0000 mg/kg | Freq: Two times a day (BID) | SUBCUTANEOUS | Status: DC
  Administered 2012-10-06 – 2012-10-09 (×7): 75 mg via SUBCUTANEOUS
  Filled 2012-10-05 (×9): qty 0.8

## 2012-10-05 MED ORDER — FENTANYL CITRATE 0.05 MG/ML IJ SOLN
50.0000 ug | Freq: Once | INTRAMUSCULAR | Status: AC
  Administered 2012-10-05: 50 ug via INTRAVENOUS
  Filled 2012-10-05: qty 2

## 2012-10-05 MED ORDER — ACETAMINOPHEN 325 MG PO TABS
650.0000 mg | ORAL_TABLET | Freq: Four times a day (QID) | ORAL | Status: DC | PRN
  Administered 2012-10-08 – 2012-10-10 (×7): 650 mg via ORAL
  Filled 2012-10-05 (×7): qty 2

## 2012-10-05 MED ORDER — PANTOPRAZOLE SODIUM 40 MG PO TBEC
40.0000 mg | DELAYED_RELEASE_TABLET | Freq: Every day | ORAL | Status: DC
  Administered 2012-10-05 – 2012-10-14 (×10): 40 mg via ORAL
  Filled 2012-10-05 (×11): qty 1

## 2012-10-05 NOTE — ED Notes (Signed)
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 Denies sob X when she has "hard pain"

## 2012-10-05 NOTE — H&P (Signed)
[facility] History and Physical  Vanessa Campos JXB:[id number] DOB: [birth year] DOA: 10/05/2012  Referring physician: Dr. Preston Fleeting PCP: Per Patient No Pcp  Specialists:   Chief Complaint:  right sided pleuritic chest pain and worsening shortness of breath  HPI: Vanessa Campos is a 62 y.o. female smoker with past medical history of hypercholesterolemia, atypical chest pain status post heart cath about 4 years ago-negative per patient report who presents with above complaints. She presents with no complaints of worsening shortness of breath and left leg pain x1 day. She also reports she has had right-sided pleuritic chest pain. 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 She was seen in the ED and a troponin was done and came back negative, CT angiogram of her chest showed arm segmental/subsegmental right lower lobe pulmonary emboli-small to moderate clot burden with a suspected developing pulmonary infarct in the medial right lung base. 23-4 pulmonary nodules were also noted in the left upper lobe and right lower lobe a 1.6 cm left lower lobe thyroid nodule . She admits to cough adoptive of blood-streaked sputum. She denies fevers. She is admitted for further evaluation and management.   Review of Systems: The patient denies anorexia, fever, weight los,, vision loss, decreased hearing, hoarseness, syncope,a, balance deficits, hemoptysis, abdominal pain, melena, hematochezia, severe indigestion/heartburn, hematuria, incontinence,  muscle weakness, suspicious skin lesions, transient blindness, difficulty walking, depression, unusual weight .    Past Medical History  Diagnosis Date  . ?Hypertension-on no meds    .  atypical chest pain -cardiac cath about 4 years ago negative per patient report     Past Surgical History  Procedure Laterality Date  .  Abdominal surgery    . Carpal tunnel release     Social History:  reports that she has been smoking.  She does not have any smokeless tobacco history on file. She reports that she does not drink alcohol or use illicit drugs.  where does patient live--home Can patient participate in ADLs-yes  Allergies  Allergen Reactions  . Vicodin (Hydrocodone-Acetaminophen) Other (See Comments)    Shaking     Family history-her grandmother had diabetes mellitus, mother hypertension, and her father and sister both had unknown cancers   Prior to Admission medications   Not on File   Physical Exam: Filed Vitals:   10/05/12 1627 10/05/12 1725 10/05/12 1750 10/05/12 1919  BP: 118/66  106/63 87/65  Pulse:   70 73  Temp:   99 F (37.2 C) 98 F (36.7 C)  TempSrc:   Oral Oral  Resp:   22 22  Height:    5\' 3"  (1.6 m)  Weight:  72.717 kg (160 lb 5 oz)  73.6 kg (162 lb 4.1 oz)  SpO2: 95%  97% 99%   Constitutional: Vital signs reviewed.  Patient is a well-developed and well-nourished  in no acute distress and cooperative with exam. Alert and oriented x3.  Head: Normocephalic and atraumatic Mouth: no erythema or exudates, MMM Eyes: PERRL, EOMI, conjunctivae normal, No scleral icterus.  Neck: Supple, Trachea midline normal ROM, No JVD, mass, thyromegaly, or carotid bruit present.  Cardiovascular: RRR, S1 normal, S2 normal, no MRG, pulses symmetric and intact bilaterally Pulmonary/Chest: Right-sided rhonchi, decreased air movement,No wheezes.  Abdominal: Soft. Non-tender, non-distended, bowel sounds are normal, no masses, organomegaly, or guarding present.  GU: no CVA tenderness Extremities: No  cyanosis and no edema, left calf tenderness present in Homans sign positive on the left leg.  Neurological: A&O x3, Strength is normal and symmetric bilaterally, cranial nerve II-XII are grossly intact, no focal motor deficit, sensory intact to light touch bilaterally.  Skin: Warm, dry and intact. No rash,  cyanosis, or clubbing.  Psychiatric: Normal mood and affect. speech and behavior is normal. Judgment and thought content normal. Cognition and memory are normal.     Labs on Admission:  Basic Metabolic Panel:  Recent Labs Lab 10/05/12 1425  NA 137  K 3.9  CL 100  CO2 25  GLUCOSE 104*  BUN 7  CREATININE 0.82  CALCIUM 9.2   Liver Function Tests: No results found for this basename: AST, ALT, ALKPHOS, BILITOT, PROT, ALBUMIN,  in the last 168 hours No results found for this basename: LIPASE, AMYLASE,  in the last 168 hours No results found for this basename: AMMONIA,  in the last 168 hours CBC:  Recent Labs Lab 10/05/12 1425  WBC 13.3*  HGB 14.4  HCT 41.6  MCV 82.5  PLT 262   Cardiac Enzymes: No results found for this basename: CKTOTAL, CKMB, CKMBINDEX, TROPONINI,  in the last 168 hours  BNP (last 3 results) No results found for this basename: PROBNP,  in the last 8760 hours CBG: No results found for this basename: GLUCAP,  in the last 168 hours  Radiological Exams on Admission: Dg Chest 2 View  10/05/2012  *RADIOLOGY REPORT*  Clinical Data: Shortness of breath, chest pain  CHEST - 2 VIEW  Comparison: 02/07/2010  Findings: Low lung volumes with bibasilar atelectasis.  No focal consolidation.  No pleural effusion or pneumothorax.  The heart is normal in size.  Mild degenerative changes of the visualized thoracolumbar spine.  IMPRESSION: No evidence of acute cardiopulmonary disease.   Original Report Authenticated By: Charline Bills, M.D.    Ct Angio Chest W/cm &/or Wo Cm  10/05/2012  *RADIOLOGY REPORT*  Clinical Data: Chest pain, evaluate for PE  CT ANGIOGRAPHY CHEST  Technique:  Multidetector CT imaging of the chest using the standard protocol during bolus administration of intravenous contrast. Multiplanar reconstructed images including MIPs were obtained and reviewed to evaluate the vascular anatomy.  Contrast: OMNIPAQUE IOHEXOL 350 MG/ML SOLN  Comparison: Chest  radiographs dated 10/06/2010  Findings: Segmental/subsegmental pulmonary emboli within branches of the right lower lobe pulmonary artery (series 5/images 127 and 146).  Overall clot burden is small to moderate.  Associated mild ground-glass opacity in the medial right lung base (series 7/image 57), likely reflecting a developing infarct.  Two subpleural 3-4 mm pulmonary nodules in the left upper lobe (series 7/image 31) and right lower lobe (series 7/image 40).  No pleural effusion or pneumothorax.  Visualized thyroid is notable for a 1.6 cm left lower thyroid nodule (series 12/image 5).  The heart is normal in size.  No pericardial effusion.  No suspicious mediastinal, hilar, or axillary lymphadenopathy.  The visualized upper abdomen is notable for two small probable cysts in the central liver (series 5/image 69).  Degenerative changes of the visualized thoracolumbar spine.  IMPRESSION: Segmental/subsegmental right lower lobe pulmonary emboli.  Overall clot burden is small to moderate.  Suspected developing pulmonary infarct in the medial right lung base.  Two 3-4 mm pulmonary nodules in the left upper lobe and right lower lobe. Assuming high risk for primary bronchogenic neoplasm, a single follow-up CT chest is suggested in 12 months.  1.6 cm left lower thyroid nodule.  Consider  thyroid ultrasound further evaluation as clinically warranted.  Above recommendation follows the consensus statement: Guidelines for Management of Small Pulmonary Nodules Detected on CT Scans: A Statement from the Fleischner Society as published in Radiology 2005; 237:395-400.  Critical Value/emergent results were called by telephone at the time of interpretation on 10/05/2012 at 1730 hours to Dr Preston Fleeting, who verbally acknowledged these results.   Original Report Authenticated By: Charline Bills, M.D.       Assessment/Plan Active Problems:   Acute pulmonary embolism -As discussed above, likely secondary to prolonged immobilization  with long hours of snow plow driving as discussed above -Also noted to have a 1.6 cm thyroid nodule, obtain ultrasound to further he eval for possible malignancy -Place on Lovenox and Coumadin, case management consult for possible outpatient Lovenox pending clinical course   Left leg pain -Likely secondary to DVT -Anticoagulation as above   Tobacco use disorder -Smoking cessation consult   GERD (gastroesophageal reflux disease) -Please on a PPI Thyroid nodule, 1.6 cm -Obtain thyroid ultrasound in her function tests. Lung nodules -Follow up outpatient for CT chest in 12 months is recommended -Smoking cessation consult as above   Code Status: FULL Family Communication: family at bed side Disposition Plan: Admit to telemetry  Time spent: >30MINS  Kela Millin [facility] Pager [phone]  If 7PM-7AM, please contact night-coverage [email or web address] Password [facility] 10/05/2012, 7:24 PM

## 2012-10-05 NOTE — Progress Notes (Addendum)
ANTICOAGULATION CONSULT NOTE - Initial Consult  Pharmacy Consult for Coumadin Indication: pulmonary embolus  Allergies  Allergen Reactions  . Vicodin (Hydrocodone-Acetaminophen) Other (See Comments)    Shaking     Patient Measurements: Height: 5\' 3"  (160 cm) Weight: 162 lb 4.1 oz (73.6 kg) IBW/kg (Calculated) : 52.4  Vital Signs: Temp: 98 F (36.7 C) (03/09 1919) Temp src: Oral (03/09 1919) BP: 87/65 mmHg (03/09 1919) Pulse Rate: 69 (03/09 2025)  Labs:  Recent Labs  10/05/12 1425 10/05/12 2000  HGB 14.4  --   HCT 41.6  --   PLT 262  --   LABPROT  --  14.3  INR  --  1.13  CREATININE 0.82  --    Estimated Creatinine Clearance: 70.1 ml/min (by C-G formula based on Cr of 0.82).   Medical History: Past Medical History  Diagnosis Date  . Hypertension   . Coronary artery disease     Medications:  No prescriptions prior to admission   Scheduled:  . [COMPLETED] enoxaparin (LOVENOX) injection  75 mg Subcutaneous To ER  . [START ON 10/06/2012] enoxaparin (LOVENOX) injection  1 mg/kg Subcutaneous Q12H  . [COMPLETED] fentaNYL  50 mcg Intravenous Once  . [COMPLETED] HYDROmorphone  1 mg Intravenous Once  . [COMPLETED] ondansetron (ZOFRAN) IV  4 mg Intravenous Once  . pantoprazole  40 mg Oral Daily  . sodium chloride  3 mL Intravenous Q12H  . [DISCONTINUED] enoxaparin  1 mg/kg Subcutaneous Once    Assessment:  62yo F admitted with chest pain and left leg swelling. CT revealed PE.  Starting Lovenox and Coumadin. Per CHEST guidelines will need 5 day overlap and INR 2 or greater x 24hrs before stopping Lovenox.  No baseline coags.   Goal of Therapy:  INR 2-3 Monitor platelets by anticoagulation protocol: Yes   Plan:   Continue Lovenox 1mg /kg SQ q12h.  After reviewing baseline coags will start Coumadin.  Charolotte Eke, PharmD, pager [phone]. 10/05/2012,8:51 PM.  Addendum:  PT/INR wnl. Start with Coumadin 7.5mg  PO tonight. Check PT/INR daily. Provide  Coumadin education.  Charolotte Eke, PharmD, pager [phone]. 10/05/2012,8:52 PM.

## 2012-10-05 NOTE — ED Provider Notes (Signed)
History     CSN: [id number]  Arrival date & time 10/05/12  1335   First MD Initiated Contact with Patient 10/05/12 1609      Chief Complaint  Patient presents with  . Chest Pain    (Consider location/radiation/quality/duration/timing/severity/associated sxs/prior treatment) Patient is a 62 y.o. female presenting with chest pain. The history is provided by the patient.  Chest Pain She had onset this morning of pain behind her left knee, swelling of her left leg, and the right-sided chest pain. Chest pain is sharp and worse with breathing and movement. There is associated mild dyspnea. She's had a slight cough productive of some clear sputum. She denies fever, chills, sweats. Pain is severe and she rates it at the 10/10 at its worst, 9/10 currently. Denies nausea, vomiting, diaphoresis. Leg pain is dull and not as severe as her chest pain. 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 She denies history of hypertension, diabetes, hyperlipidemia. She does smoke one pack of cigarettes a day.  Past Medical History  Diagnosis Date  . Hypertension   . Coronary artery disease     Past Surgical History  Procedure Laterality Date  . Abdominal surgery    . Carpal tunnel release      No family history on file.  History  Substance Use Topics  . Smoking status: Current Every Day Smoker -- 1.00 packs/day  . Smokeless tobacco: Not on file  . Alcohol Use: No    OB History   Grav Para Term Preterm Abortions TAB SAB Ect Mult Living                  Review of Systems  Cardiovascular: Positive for chest pain.  All other systems reviewed and are negative.    Allergies  Vicodin  Home Medications  No current outpatient prescriptions on file.  BP 135/78  Pulse 95  Temp(Src) 98.1 F (36.7 C) (Oral)  SpO2 97%  Physical Exam  Nursing note and vitals reviewed.  62 year old female, resting  comfortably and in no acute distress. Vital signs are normal. Oxygen saturation is 97%, which is normal. Head is normocephalic and atraumatic. PERRLA, EOMI. Oropharynx is clear. Neck is nontender and supple without adenopathy or JVD. Back is nontender and there is no CVA tenderness. Lungs are clear without rales, wheezes, or rhonchi. Chest is moderately tender in the right posterior lateral rib cage. Heart has regular rate and rhythm without murmur. Abdomen is soft, flat, nontender without masses or hepatosplenomegaly and peristalsis is normoactive. Extremities have no cyanosis or edema, full range of motion is present. There is moderate tenderness in the left popliteal space. Left calf circumference is 2 cm greater than right calf circumference. There are no palpable cords and negative Homans sign. Skin is warm and dry without rash. Neurologic: Mental status is normal, cranial nerves are intact, there are no motor or sensory deficits.  ED Course  Procedures (including critical care time)  Results for orders placed during the hospital encounter of 10/05/12  CBC      Result Value Range   WBC 13.3 (*) 4.0 - 10.5 K/uL   RBC 5.04  3.87 - 5.11 MIL/uL   Hemoglobin 14.4  12.0 - 15.0 g/dL   HCT 29.5  28.4 - 13.2 %   MCV 82.5  78.0 - 100.0 fL   MCH 28.6  26.0 -  34.0 pg   MCHC 34.6  30.0 - 36.0 g/dL   RDW 30.8  65.7 - 84.6 %   Platelets 262  150 - 400 K/uL  BASIC METABOLIC PANEL      Result Value Range   Sodium 137  135 - 145 mEq/L   Potassium 3.9  3.5 - 5.1 mEq/L   Chloride 100  96 - 112 mEq/L   CO2 25  19 - 32 mEq/L   Glucose, Bld 104 (*) 70 - 99 mg/dL   BUN 7  6 - 23 mg/dL   Creatinine, Ser 9.62  0.50 - 1.10 mg/dL   Calcium 9.2  8.4 - 95.2 mg/dL   GFR calc non Af Amer 76 (*) >90 mL/min   GFR calc Af Amer 88 (*) >90 mL/min  POCT I-STAT TROPONIN I      Result Value Range   Troponin i, poc 0.00  0.00 - 0.08 ng/mL   Comment 3            Dg Chest 2 View  10/05/2012  *RADIOLOGY REPORT*   Clinical Data: Shortness of breath, chest pain  CHEST - 2 VIEW  Comparison: 02/07/2010  Findings: Low lung volumes with bibasilar atelectasis.  No focal consolidation.  No pleural effusion or pneumothorax.  The heart is normal in size.  Mild degenerative changes of the visualized thoracolumbar spine.  IMPRESSION: No evidence of acute cardiopulmonary disease.   Original Report Authenticated By: Charline Bills, M.D.    Ct Angio Chest W/cm &/or Wo Cm  10/05/2012  *RADIOLOGY REPORT*  Clinical Data: Chest pain, evaluate for PE  CT ANGIOGRAPHY CHEST  Technique:  Multidetector CT imaging of the chest using the standard protocol during bolus administration of intravenous contrast. Multiplanar reconstructed images including MIPs were obtained and reviewed to evaluate the vascular anatomy.  Contrast: OMNIPAQUE IOHEXOL 350 MG/ML SOLN  Comparison: Chest radiographs dated 10/06/2010  Findings: Segmental/subsegmental pulmonary emboli within branches of the right lower lobe pulmonary artery (series 5/images 127 and 146).  Overall clot burden is small to moderate.  Associated mild ground-glass opacity in the medial right lung base (series 7/image 57), likely reflecting a developing infarct.  Two subpleural 3-4 mm pulmonary nodules in the left upper lobe (series 7/image 31) and right lower lobe (series 7/image 40).  No pleural effusion or pneumothorax.  Visualized thyroid is notable for a 1.6 cm left lower thyroid nodule (series 12/image 5).  The heart is normal in size.  No pericardial effusion.  No suspicious mediastinal, hilar, or axillary lymphadenopathy.  The visualized upper abdomen is notable for two small probable cysts in the central liver (series 5/image 69).  Degenerative changes of the visualized thoracolumbar spine.  IMPRESSION: Segmental/subsegmental right lower lobe pulmonary emboli.  Overall clot burden is small to moderate.  Suspected developing pulmonary infarct in the medial right lung base.  Two 3-4 mm  pulmonary nodules in the left upper lobe and right lower lobe. Assuming high risk for primary bronchogenic neoplasm, a single follow-up CT chest is suggested in 12 months.  1.6 cm left lower thyroid nodule.  Consider thyroid ultrasound further evaluation as clinically warranted.  Above recommendation follows the consensus statement: Guidelines for Management of Small Pulmonary Nodules Detected on CT Scans: A Statement from the Fleischner Society as published in Radiology 2005; 237:395-400.  Critical Value/emergent results were called by telephone at the time of interpretation on 10/05/2012 at 1730 hours to Dr Preston Fleeting, who verbally acknowledged these results.   Original Report Authenticated By:  Charline Bills, M.D.     Images viewed by me, discussed with radiologist.   Date: 10/05/2012  Rate: 93  Rhythm: normal sinus rhythm  QRS Axis: normal  Intervals: normal  ST/T Wave abnormalities: normal  Conduction Disutrbances:none  Narrative Interpretation: Left atrial hypertrophy, right atrial hypertrophy, right ventricular hypertrophy. No prior ECG available for comparison.  Old EKG Reviewed: none available    1. Pulmonary embolism on right       MDM  Calf pain and pleuritic chest pain worrisome for her DVT with PE. Although, exam, leg pain seems more likely to be related to a Baker's cyst. Because she has been in a vehicle for extended periods of time, she is at a fairly high-risk for PE and cannot be adequately screened with d-dimer so CT angiogram will be obtained.  CT angiogram shows evidence of pulmonary emboli on the right with pulmonary infarction on the right. Images were viewed by me and discussed with radiologist. She started on Lovenox and case is discussed with Dr. Suanne Marker of [facility] who agrees to admit the patient.      Dione Booze, MD 10/05/12 [phone]

## 2012-10-05 NOTE — ED Notes (Signed)
Called Floor to give report, nurse unavailable at this time, awaiting a call back

## 2012-10-06 ENCOUNTER — Inpatient Hospital Stay (HOSPITAL_COMMUNITY): Payer: 59

## 2012-10-06 DIAGNOSIS — I82409 Acute embolism and thrombosis of unspecified deep veins of unspecified lower extremity: Secondary | ICD-10-CM

## 2012-10-06 DIAGNOSIS — F17200 Nicotine dependence, unspecified, uncomplicated: Secondary | ICD-10-CM

## 2012-10-06 LAB — CBC
HCT: 39.2 % (ref 36.0–46.0)
MCHC: 33.4 g/dL (ref 30.0–36.0)
MCV: 83.8 fL (ref 78.0–100.0)
RDW: 13.4 % (ref 11.5–15.5)

## 2012-10-06 LAB — GLUCOSE, CAPILLARY: Glucose-Capillary: 96 mg/dL (ref 70–99)

## 2012-10-06 MED ORDER — DIPHENHYDRAMINE HCL 25 MG PO CAPS
ORAL_CAPSULE | ORAL | Status: AC
  Administered 2012-10-06: 25 mg via ORAL
  Filled 2012-10-06: qty 1

## 2012-10-06 MED ORDER — LEVALBUTEROL HCL 0.63 MG/3ML IN NEBU
0.6300 mg | INHALATION_SOLUTION | Freq: Three times a day (TID) | RESPIRATORY_TRACT | Status: DC
  Administered 2012-10-06 – 2012-10-10 (×11): 0.63 mg via RESPIRATORY_TRACT
  Filled 2012-10-06 (×14): qty 3

## 2012-10-06 MED ORDER — WARFARIN SODIUM 7.5 MG PO TABS
7.5000 mg | ORAL_TABLET | Freq: Once | ORAL | Status: AC
  Administered 2012-10-06: 7.5 mg via ORAL
  Filled 2012-10-06: qty 1

## 2012-10-06 MED ORDER — DIPHENHYDRAMINE HCL 25 MG PO CAPS
25.0000 mg | ORAL_CAPSULE | Freq: Three times a day (TID) | ORAL | Status: DC | PRN
  Administered 2012-10-07 – 2012-10-08 (×4): 25 mg via ORAL
  Filled 2012-10-06 (×4): qty 1

## 2012-10-06 NOTE — Progress Notes (Signed)
   CARE MANAGEMENT NOTE 10/06/2012  Patient:  Vanessa Campos, Vanessa Campos   Account Number:  [id number]  Date Initiated:  10/06/2012  Documentation initiated by:  Jiles Crocker  Subjective/Objective Assessment:   ADMITTED WITH PE     Action/Plan:   PATIENT IS INDEPENDENT PRIOR TO ADMISSION, WORKS FULL TIME; HAS PRIVATE INSURANCE WITH [employer]   Anticipated DC Date:  10/10/2012   Anticipated DC Plan:  HOME/SELF CARE      DC Planning Services  CM consult         Status of service:  In process, will continue to follow Medicare Important Message given?  NA - LOS <3 / Initial given by admissions (If response is "NO", the following Medicare IM given date fields will be blank) Per UR Regulation:  Reviewed for med. necessity/level of care/duration of stay Comments:  10/06/2012- B CHANDLER RN,BSN,MHA

## 2012-10-06 NOTE — Progress Notes (Signed)
VASCULAR LAB PRELIMINARY  PRELIMINARY  PRELIMINARY  PRELIMINARY  Bilateral lower extremity venous duplex completed.    Preliminary report:  Right:  No evidence of DVT, superficial thrombosis, or Baker's cyst.  Left: DVT noted in the gastrocnemius vein.  It does not appear to enter into the popliteal vein.  No evidence of superficial thrombosis.  No Baker's cyst.   CESTONE, HELENE, RVT 10/06/2012, 9:25 AM

## 2012-10-06 NOTE — Progress Notes (Signed)
ANTICOAGULATION CONSULT NOTE - Follow Up  Pharmacy Consult for Warfarin Indication: pulmonary embolus  Allergies  Allergen Reactions  . Vicodin (Hydrocodone-Acetaminophen) Other (See Comments)    Shaking     Patient Measurements: Height: 5\' 3"  (160 cm) Weight: 162 lb 4.1 oz (73.6 kg) IBW/kg (Calculated) : 52.4  Vital Signs: Temp: 98.9 F (37.2 C) (03/10 0504) Temp src: Oral (03/10 0504) BP: 110/62 mmHg (03/10 0504) Pulse Rate: 68 (03/10 0504)  Labs:  Recent Labs  10/05/12 1425 10/05/12 2000 10/06/12 0506  HGB 14.4  --  13.1  HCT 41.6  --  39.2  PLT 262  --  233  LABPROT  --  14.3 13.8  INR  --  1.13 1.07  CREATININE 0.82  --   --    Estimated Creatinine Clearance: 70.1 ml/min (by C-G formula based on Cr of 0.82).  Medications:  No prescriptions prior to admission   Scheduled:  . [COMPLETED] enoxaparin (LOVENOX) injection  75 mg Subcutaneous To ER  . enoxaparin (LOVENOX) injection  1 mg/kg Subcutaneous Q12H  . [COMPLETED] fentaNYL  50 mcg Intravenous Once  . [COMPLETED] HYDROmorphone  1 mg Intravenous Once  . [COMPLETED] ondansetron (ZOFRAN) IV  4 mg Intravenous Once  . pantoprazole  40 mg Oral Daily  . sodium chloride  3 mL Intravenous Q12H  . [COMPLETED] warfarin  7.5 mg Oral Once  . Warfarin - Pharmacist Dosing Inpatient   Does not apply q1800  . [DISCONTINUED] enoxaparin  1 mg/kg Subcutaneous Once    Assessment:  62 yo F admitted 3/9 with chest pain and left leg swelling. CT revealed PE.  Lovenox 1mg /kg q12h and Coumadin started 3/9. Per CHEST guidelines, patient will need 5 day overlap and INR 2 or greater x 24hrs before stopping Lovenox.  Today is Day #2 of 5 Day minimun overlap.  INR is subtherapeutic as expected after only one dose of warfarin.   Goal of Therapy:  INR 2-3 Monitor platelets by anticoagulation protocol: Yes   Plan:   Continue Lovenox 1mg /kg SQ q12h.  Repeat warfarin 7.5mg  PO x1  Warfarin education to be completed prior  to discharge.  Darrol Angel, PharmD Pager: [phone] 10/06/2012 11:24 AM

## 2012-10-06 NOTE — Progress Notes (Signed)
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  -Also noted to have a 1.6 cm thyroid nodule, obtain ultrasound to further he eval for possible malignancy  -continue Lovenox and Coumadin, INR still subtherapeutic Left leg pain/L DVT, gastrocnemius vv  -Likely secondary to DVT  -Anticoagulation as above  Tobacco use disorder  -Smoking cessation counselling GERD (gastroesophageal reflux disease)  -continue  PPI  Thyroid nodule, 1.6 cm  -thyroid function tests nl .await thyroid ultrasound    Lung nodules  -Follow up outpatient for CT chest in 12 months is recommended  -Smoking cessation consult as above   Code Status: full Family Communication: SON at bedside Disposition Plan: to home when stable   Consultants:  NONE  Procedures:  NONE  Antibiotics:  NONE  HPI/Subjective: C/o sob, and R. Pleuritic pain  Objective: Filed Vitals:   10/05/12 2108 10/06/12 0504 10/06/12 0934 10/06/12 1456  BP: 110/64 110/62  111/65  Pulse: 76 68  71  Temp: 98.9 F (37.2 C) 98.9 F (37.2 C)  98.6 F (37 C)  TempSrc: Oral Oral  Oral  Resp: 22 24  20   Height:      Weight:      SpO2: 98% 96% 91% 100%    Intake/Output Summary (Last 24 hours) at 10/06/12 1740 Last data filed at 10/06/12 1300  Gross per 24 hour  Intake    360 ml  Output    600 ml  Net   -240 ml   Filed Weights   10/05/12 1725 10/05/12 1919  Weight: 72.717 kg (160 lb 5 oz) 73.6 kg (162 lb 4.1 oz)    Exam:   General:  Alert and oriented x3, in moderate distress secondary to pain.Resp non labored  Cardiovascular: RRR nl S1S2  Respiratory: scattered exp crackles  Abdomen: soft NT/ND+BS  Extremities: L.calf tenderness present  Data Reviewed: Basic Metabolic  Panel:  Recent Labs Lab 10/05/12 1425  NA 137  K 3.9  CL 100  CO2 25  GLUCOSE 104*  BUN 7  CREATININE 0.82  CALCIUM 9.2   Liver Function Tests: No results found for this basename: AST, ALT, ALKPHOS, BILITOT, PROT, ALBUMIN,  in the last 168 hours No results found for this basename: LIPASE, AMYLASE,  in the last 168 hours No results found for this basename: AMMONIA,  in the last 168 hours CBC:  Recent Labs Lab 10/05/12 1425 10/06/12 0506  WBC 13.3* 12.3*  HGB 14.4 13.1  HCT 41.6 39.2  MCV 82.5 83.8  PLT 262 233   Cardiac Enzymes: No results found for this basename: CKTOTAL, CKMB, CKMBINDEX, TROPONINI,  in the last 168 hours BNP (last 3 results) No results found for this basename: PROBNP,  in the last 8760 hours CBG:  Recent Labs Lab 10/06/12 0746  GLUCAP 96    No results found for this or any previous visit (from the past 240 hour(s)).   Studies: Dg Chest 2 View  10/05/2012  *RADIOLOGY REPORT*  Clinical Data: Shortness of breath, chest pain  CHEST - 2 VIEW  Comparison: 02/07/2010  Findings: Low lung volumes with bibasilar atelectasis.  No focal consolidation.  No pleural effusion or pneumothorax.  The heart is normal in size.  Mild degenerative changes of the visualized thoracolumbar spine.  IMPRESSION: No evidence of acute cardiopulmonary disease.  Original Report Authenticated By: Charline Bills, M.D.    Ct Angio Chest W/cm &/or Wo Cm  10/05/2012  *RADIOLOGY REPORT*  Clinical Data: Chest pain, evaluate for PE  CT ANGIOGRAPHY CHEST  Technique:  Multidetector CT imaging of the chest using the standard protocol during bolus administration of intravenous contrast. Multiplanar reconstructed images including MIPs were obtained and reviewed to evaluate the vascular anatomy.  Contrast: OMNIPAQUE IOHEXOL 350 MG/ML SOLN  Comparison: Chest radiographs dated 10/06/2010  Findings: Segmental/subsegmental pulmonary emboli within branches of the right lower lobe pulmonary  artery (series 5/images 127 and 146).  Overall clot burden is small to moderate.  Associated mild ground-glass opacity in the medial right lung base (series 7/image 57), likely reflecting a developing infarct.  Two subpleural 3-4 mm pulmonary nodules in the left upper lobe (series 7/image 31) and right lower lobe (series 7/image 40).  No pleural effusion or pneumothorax.  Visualized thyroid is notable for a 1.6 cm left lower thyroid nodule (series 12/image 5).  The heart is normal in size.  No pericardial effusion.  No suspicious mediastinal, hilar, or axillary lymphadenopathy.  The visualized upper abdomen is notable for two small probable cysts in the central liver (series 5/image 69).  Degenerative changes of the visualized thoracolumbar spine.  IMPRESSION: Segmental/subsegmental right lower lobe pulmonary emboli.  Overall clot burden is small to moderate.  Suspected developing pulmonary infarct in the medial right lung base.  Two 3-4 mm pulmonary nodules in the left upper lobe and right lower lobe. Assuming high risk for primary bronchogenic neoplasm, a single follow-up CT chest is suggested in 12 months.  1.6 cm left lower thyroid nodule.  Consider thyroid ultrasound further evaluation as clinically warranted.  Above recommendation follows the consensus statement: Guidelines for Management of Small Pulmonary Nodules Detected on CT Scans: A Statement from the Fleischner Society as published in Radiology 2005; 237:395-400.  Critical Value/emergent results were called by telephone at the time of interpretation on 10/05/2012 at 1730 hours to Dr Preston Fleeting, who verbally acknowledged these results.   Original Report Authenticated By: Charline Bills, M.D.     Scheduled Meds: . enoxaparin (LOVENOX) injection  1 mg/kg Subcutaneous Q12H  . pantoprazole  40 mg Oral Daily  . sodium chloride  3 mL Intravenous Q12H  . Warfarin - Pharmacist Dosing Inpatient   Does not apply q1800   Continuous Infusions:   Active  Problems:   Acute pulmonary embolism   Left leg pain   Tobacco use disorder   GERD (gastroesophageal reflux disease)    Time spent:    [facility]  [facility] Pager [phone]. If 7PM-7AM, please contact night-coverage at [email or web address], password [facility] 10/06/2012, 5:40 PM  LOS: 1 day

## 2012-10-07 DIAGNOSIS — I82409 Acute embolism and thrombosis of unspecified deep veins of unspecified lower extremity: Secondary | ICD-10-CM

## 2012-10-07 LAB — URINALYSIS, ROUTINE W REFLEX MICROSCOPIC
Ketones, ur: NEGATIVE mg/dL
Nitrite: NEGATIVE
Protein, ur: 30 mg/dL — AB
Urobilinogen, UA: 1 mg/dL (ref 0.0–1.0)

## 2012-10-07 LAB — PROTIME-INR
INR: 1.41 (ref 0.00–1.49)
Prothrombin Time: 16.9 seconds — ABNORMAL HIGH (ref 11.6–15.2)

## 2012-10-07 LAB — URINE MICROSCOPIC-ADD ON

## 2012-10-07 MED ORDER — NICOTINE 14 MG/24HR TD PT24
14.0000 mg | MEDICATED_PATCH | Freq: Every day | TRANSDERMAL | Status: DC
  Administered 2012-10-07 – 2012-10-14 (×8): 14 mg via TRANSDERMAL
  Filled 2012-10-07 (×8): qty 1

## 2012-10-07 MED ORDER — WARFARIN SODIUM 7.5 MG PO TABS
7.5000 mg | ORAL_TABLET | Freq: Once | ORAL | Status: AC
  Administered 2012-10-07: 7.5 mg via ORAL
  Filled 2012-10-07: qty 1

## 2012-10-07 MED ORDER — VITAMINS A & D EX OINT
TOPICAL_OINTMENT | CUTANEOUS | Status: AC
  Administered 2012-10-07: 22:00:00
  Filled 2012-10-07: qty 5

## 2012-10-07 NOTE — Progress Notes (Signed)
ANTICOAGULATION CONSULT NOTE - Follow Up  Pharmacy Consult for Warfarin Indication: pulmonary embolus  Allergies  Allergen Reactions  . Vicodin (Hydrocodone-Acetaminophen) Other (See Comments)    Shaking     Patient Measurements: Height: 5\' 3"  (160 cm) Weight: 162 lb 4.1 oz (73.6 kg) IBW/kg (Calculated) : 52.4  Vital Signs: Temp: 99.9 F (37.7 C) (03/11 0500) Temp src: Oral (03/11 0500) BP: 112/68 mmHg (03/11 0500) Pulse Rate: 85 (03/11 0500)  Labs:  Recent Labs  10/05/12 1425 10/05/12 2000 10/06/12 0506 10/07/12 0420  HGB 14.4  --  13.1  --   HCT 41.6  --  39.2  --   PLT 262  --  233  --   LABPROT  --  14.3 13.8 16.9*  INR  --  1.13 1.07 1.41  CREATININE 0.82  --   --   --    Estimated Creatinine Clearance: 70.1 ml/min (by C-G formula based on Cr of 0.82).  Medications:  No prescriptions prior to admission   Scheduled:  . enoxaparin (LOVENOX) injection  1 mg/kg Subcutaneous Q12H  . levalbuterol  0.63 mg Nebulization TID  . pantoprazole  40 mg Oral Daily  . sodium chloride  3 mL Intravenous Q12H  . [COMPLETED] warfarin  7.5 mg Oral ONCE-1800  . Warfarin - Pharmacist Dosing Inpatient   Does not apply q1800    Assessment:  62 yo F admitted 3/9 with chest pain and left leg swelling. CT revealed PE.  Lovenox 1mg /kg q12h and Coumadin started 3/9. Per CHEST guidelines, patient will need 5 day overlap and INR 2 or greater x 24hrs before stopping Lovenox.  Today is Day #3 of 5 Day minimun overlap.  INR is subtherapeutic as expected after only two doses of warfarin, starting to respond to warfarin doses.   Goal of Therapy:  INR 2-3 Monitor platelets by anticoagulation protocol: Yes   Plan:   Continue Lovenox 1mg /kg SQ q12h.  Repeat warfarin 7.5mg  PO x1  Warfarin education to be completed prior to discharge.  Darrol Angel, PharmD Pager: [phone] 10/07/2012 9:20 AM

## 2012-10-07 NOTE — Progress Notes (Addendum)
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 -please follow up on results of pending thyroid ultrasound and further eval/manage accordingly     Lung nodules  -Follow up outpatient for CT chest in 12 months is recommended  -counseled to quit tobacco Tobacco abuse -as above, counseled to quit tobacco  Code Status: full Family Communication: Sister at bedside Disposition Plan: to home when stable   Consultants:  NONE  Procedures:  NONE  Antibiotics:  NONE  HPI/Subjective: Breathing better today, still with R.pleuritic pain but better  Objective: Filed Vitals:   10/06/12 2347 10/07/12 0500 10/07/12 0855 10/07/12 1433  BP:  112/68  115/69  Pulse:  85  90  Temp:  99.9 F (37.7 C)  98.4 F (36.9 C)  TempSrc:  Oral  Oral  Resp: 22 20  20   Height:      Weight:      SpO2:  93% 94% 95%    Intake/Output Summary (Last 24 hours) at 10/07/12 1827 Last data filed at 10/07/12 1436  Gross per 24 hour  Intake    540 ml  Output   1100 ml  Net   -560 ml   Filed Weights   10/05/12 1725 10/05/12 1919  Weight: 72.717 kg (160 lb 5 oz) 73.6 kg (162 lb 4.1 oz)    Exam:   General:  Alert and oriented x3, in moderate distress secondary to pain.Resp non labored  Cardiovascular: RRR nl S1S2  Respiratory:  decreased exp crackles, no wheezes  Abdomen: soft NT/ND+BS  Extremities: decreasedL.calf tenderness, no edema  Data Reviewed: Basic Metabolic Panel:  Recent Labs Lab 10/05/12 1425  NA 137  K 3.9  CL 100  CO2 25  GLUCOSE 104*  BUN 7  CREATININE 0.82  CALCIUM 9.2   Liver Function Tests: No results found for this basename: AST, ALT, ALKPHOS, BILITOT, PROT, ALBUMIN,  in the last 168 hours No results found for this basename: LIPASE, AMYLASE,  in the last 168 hours No results found for this basename: AMMONIA,  in the last 168 hours CBC:  Recent Labs Lab 10/05/12 1425 10/06/12 0506  WBC 13.3* 12.3*  HGB 14.4 13.1  HCT 41.6 39.2  MCV 82.5 83.8  PLT 262 233   Cardiac Enzymes: No results found for this basename: CKTOTAL, CKMB, CKMBINDEX, TROPONINI,  in the last 168 hours BNP (last 3 results) No results found for this basename: PROBNP,  in the last 8760 hours CBG:  Recent Labs Lab 10/06/12 0746  GLUCAP 96    No results found for this or any previous visit (from the past 240 hour(s)).   Studies: US Soft Tissue Head/neck  10/06/2012  *RADIOLOGY REPORT*  Clinical Data: Evaluate left lobe thyroid nodule identified on recent CT.  THYROID ULTRASOUND  Technique: Ultrasound examination of the thyroid gland and adjacent soft tissues was performed.  Comparison:  CT chest yesterday.  No prior  thyroid imaging.  Findings:  Right thyroid lobe:  Normal in size and echotexture measuring approximately 3.6 x 1.1 x 1.2 cm.  Left thyroid lobe:  Normal in size and echotexture measuring approximately 4.1 x 1.7 x 1.9 cm.  Isthmus:  Normal in thickness measuring 0.2 cm.  Focal nodules: 1.  Heterogeneous solid nodule arising from the lower pole of the left lobe, corresponding to the abnormality on CT, measuring approximately 2.8 x 1.8 x 1.8 cm.  Relatively diminished color Doppler flow within the nodule. 2.  Vague hypoechoic solid nodule in the mid right lobe of the gland measuring approximately 1.0  x 0.7 x 0.7 cm.  Approximate 0.3 cm cyst immediately adjacent to this nodule.  Lymphadenopathy:  None visualized.  IMPRESSION: Heterogeneous approximate 2.8 cm solid nodule arising from the lower pole of the left lobe of the thyroid gland.  Biopsy of this nodule is recommended.  This recommendation follows the consensus statement:  Management of Thyroid Nodules Detected as [place]:  Society of Radiologists in Ultrasound Consensus Conference Statement. Radiology 2005; [id number].   Original Report Authenticated By: Hulan Saas, M.D.     Scheduled Meds: . enoxaparin (LOVENOX) injection  1 mg/kg Subcutaneous Q12H  . levalbuterol  0.63 mg Nebulization TID  . nicotine  14 mg Transdermal Daily  . pantoprazole  40 mg Oral Daily  . sodium chloride  3 mL Intravenous Q12H  . Warfarin - Pharmacist Dosing Inpatient   Does not apply q1800   Continuous Infusions:   Active Problems:   Acute pulmonary embolism   Left leg pain   Tobacco use disorder   GERD (gastroesophageal reflux disease)    Time spent:    [facility]  [facility] Pager [phone]. If 7PM-7AM, please contact night-coverage at [email or web address], password [facility] 10/07/2012, 6:27 PM  LOS: 2 days

## 2012-10-08 DIAGNOSIS — K5900 Constipation, unspecified: Secondary | ICD-10-CM | POA: Diagnosis present

## 2012-10-08 DIAGNOSIS — E041 Nontoxic single thyroid nodule: Secondary | ICD-10-CM

## 2012-10-08 MED ORDER — SENNOSIDES-DOCUSATE SODIUM 8.6-50 MG PO TABS
1.0000 | ORAL_TABLET | Freq: Every evening | ORAL | Status: DC | PRN
  Filled 2012-10-08: qty 1

## 2012-10-08 MED ORDER — DOCUSATE SODIUM 100 MG PO CAPS
100.0000 mg | ORAL_CAPSULE | Freq: Two times a day (BID) | ORAL | Status: DC
  Administered 2012-10-08 – 2012-10-10 (×5): 100 mg via ORAL
  Filled 2012-10-08 (×14): qty 1

## 2012-10-08 MED ORDER — POLYETHYLENE GLYCOL 3350 17 G PO PACK
17.0000 g | PACK | Freq: Every day | ORAL | Status: DC
  Administered 2012-10-08: 17 g via ORAL
  Filled 2012-10-08 (×2): qty 1

## 2012-10-08 MED ORDER — WARFARIN SODIUM 5 MG PO TABS
5.0000 mg | ORAL_TABLET | Freq: Once | ORAL | Status: AC
  Administered 2012-10-08: 5 mg via ORAL
  Filled 2012-10-08: qty 1

## 2012-10-08 NOTE — Progress Notes (Addendum)
Patient complaining of chest pain although pointing to right flank.  Checked vitals and ECG.  NSR, 98% on 3.5L.  Patient sounds congested and producing clear sputum, blood tinged. Md aware and will continue to monitor.

## 2012-10-08 NOTE — Progress Notes (Addendum)
ANTICOAGULATION CONSULT NOTE - Follow Up  Pharmacy Consult for Warfarin Indication: pulmonary embolus (plus L leg DVT?)  Allergies  Allergen Reactions  . Vicodin (Hydrocodone-Acetaminophen) Other (See Comments)    Shaking     Patient Measurements: Height: 5\' 3"  (160 cm) Weight: 162 lb 4.1 oz (73.6 kg) IBW/kg (Calculated) : 52.4  Labs:  Recent Labs  10/05/12 1425  10/06/12 0506 10/07/12 0420 10/08/12 0425  HGB 14.4  --  13.1  --   --   HCT 41.6  --  39.2  --   --   PLT 262  --  233  --   --   LABPROT  --   < > 13.8 16.9* 22.7*  INR  --   < > 1.07 1.41 2.10*  CREATININE 0.82  --   --   --   --   < > = values in this interval not displayed. Estimated Creatinine Clearance: 70.1 ml/min (by C-G formula based on Cr of 0.82).  Medications:  No prescriptions prior to admission   Scheduled:  . enoxaparin (LOVENOX) injection  1 mg/kg Subcutaneous Q12H  . levalbuterol  0.63 mg Nebulization TID  . nicotine  14 mg Transdermal Daily  . pantoprazole  40 mg Oral Daily  . sodium chloride  3 mL Intravenous Q12H  . [COMPLETED] vitamin A & D      . [COMPLETED] warfarin  7.5 mg Oral ONCE-1800  . Warfarin - Pharmacist Dosing Inpatient   Does not apply q1800    Assessment:  62 yo F admitted 3/9 with chest pain and left leg swelling. CT revealed PE.  Lovenox 1mg /kg q12h and Coumadin started 3/9. Per CHEST guidelines, patient will need 5 day overlap and INR 2 or greater x 24hrs before stopping Lovenox.  Today is Day #4 of 5 Day minimun overlap.  INR is therapeutic this am, but larger-than-expected increase in INR overnight (1.41 --> 2.1). Will use lower warfarin dose tonight  No bleeding reported in chart notes   Goal of Therapy:  INR 2-3 Monitor platelets by anticoagulation protocol: Yes   Plan:   Continue Lovenox 1mg /kg SQ q12h.  Warfarin 5mg  PO x1  Warfarin education to be completed prior to discharge.  Continue Lovenox through tomorrow. Can d/c Lovenox after  tomorrow's doses if INR remains > 2 in am.  Darrol Angel, PharmD Pager: [phone] 10/08/2012 8:35 AM  ADDENDUM: Standard warfarin education completed. Pt provided with a handout. Pt expressed verbal understanding.   Darrol Angel, PharmD Pager: [phone] 10/08/2012 2:17 PM

## 2012-10-08 NOTE — Progress Notes (Signed)
Patient and family requesting to speak with Md regarding results of CT and any follow up recommendations.  Explained Md would round today and discuss at that time.

## 2012-10-08 NOTE — Progress Notes (Addendum)
Patient states she is coughing up blood this am.  States she coughed up blood prior to admission and each day while at the hospital during this admission.  States this last episode, this am is less than previous days.

## 2012-10-08 NOTE — Progress Notes (Signed)
[facility] PROGRESS NOTE  Vanessa Campos ZOX:[id number] DOB: Sep 18, 1950 DOA: 10/05/2012 PCP: Per Patient No Pcp  Assessment/Plan:  #1 acute PE/left lower extremity DVT Questionable etiology. Continue Coumadin. INR is currently at 2.10.  #2 thyroid nodule Thyroid ultrasound shows a heterogeneous thyroid nodule. Will likely need a FNA biopsy. Will consult with interventional radiology.  #3 lung nodules and in an and maximum and Will need followup CT in 12 months.  #4 gastroesophageal reflux disease PPI  #5 tobacco abuse Tobacco cessation.  Code Status: Full Family Communication: Updated patient and daughters at bedside. Disposition Plan: Home when medically stable.   Consultants:  None  Procedures:  Thyroid ultrasound 10/07/2012  Lower extremity Dopplers 10/06/2012  CT of the chest 10/05/2012  Chest x-ray 10/05/2012  Antibiotics:  None  HPI/Subjective: Patient complaining of pleuritic right-sided chest pain. Patient also complaining of constipation.  Objective: Filed Vitals:   10/08/12 0521 10/08/12 0901 10/08/12 1302 10/08/12 1531  BP: 104/75  105/60   Pulse: 73  81   Temp: 98.1 F (36.7 C)  98.3 F (36.8 C)   TempSrc: Oral  Oral   Resp: 20  20   Height:      Weight:      SpO2: 95% 95% 97% 97%    Intake/Output Summary (Last 24 hours) at 10/08/12 1851 Last data filed at 10/08/12 1700  Gross per 24 hour  Intake    250 ml  Output   1500 ml  Net  -1250 ml   Filed Weights   10/05/12 1725 10/05/12 1919  Weight: 72.717 kg (160 lb 5 oz) 73.6 kg (162 lb 4.1 oz)    Exam:   General:  NAD  Cardiovascular: RRR. No lower extremity edema.  Respiratory: Shallow breath sounds. No wheezing.  Abdomen: Soft, nontender, nondistended, positive bowel sounds.   Data Reviewed: Basic Metabolic Panel:  Recent Labs Lab 10/05/12 1425  NA 137  K 3.9  CL 100  CO2 25  GLUCOSE 104*  BUN 7  CREATININE 0.82  CALCIUM 9.2   Liver Function  Tests: No results found for this basename: AST, ALT, ALKPHOS, BILITOT, PROT, ALBUMIN,  in the last 168 hours No results found for this basename: LIPASE, AMYLASE,  in the last 168 hours No results found for this basename: AMMONIA,  in the last 168 hours CBC:  Recent Labs Lab 10/05/12 1425 10/06/12 0506  WBC 13.3* 12.3*  HGB 14.4 13.1  HCT 41.6 39.2  MCV 82.5 83.8  PLT 262 233   Cardiac Enzymes: No results found for this basename: CKTOTAL, CKMB, CKMBINDEX, TROPONINI,  in the last 168 hours BNP (last 3 results) No results found for this basename: PROBNP,  in the last 8760 hours CBG:  Recent Labs Lab 10/06/12 0746  GLUCAP 96    Recent Results (from the past 240 hour(s))  URINE CULTURE     Status: None   Collection Time    10/07/12  7:30 AM      Result Value Range Status   Specimen Description URINE, CLEAN CATCH   Final   Special Requests NONE   Final   Culture  Setup Time 10/07/2012 16:30   Final   Colony Count 65,000 COLONIES/ML   Final   Culture     Final   Value: Multiple bacterial morphotypes present, none predominant. Suggest appropriate recollection if clinically indicated.   Report Status 10/08/2012 FINAL   Final     Studies: No results found.  Scheduled Meds: . docusate sodium  100 mg Oral BID  . enoxaparin (LOVENOX) injection  1 mg/kg Subcutaneous Q12H  . levalbuterol  0.63 mg Nebulization TID  . nicotine  14 mg Transdermal Daily  . pantoprazole  40 mg Oral Daily  . polyethylene glycol  17 g Oral Daily  . sodium chloride  3 mL Intravenous Q12H  . Warfarin - Pharmacist Dosing Inpatient   Does not apply q1800   Continuous Infusions:   Active Problems:   Acute pulmonary embolism   Left leg pain   Tobacco use disorder   GERD (gastroesophageal reflux disease)    Time spent: > 30 mins    [facility]  [facility] Pager [phone]. If 7PM-7AM, please contact night-coverage at [email or web address], password [facility] 10/08/2012, 6:52 PM  LOS: 3 days

## 2012-10-09 ENCOUNTER — Inpatient Hospital Stay (HOSPITAL_COMMUNITY): Payer: 59

## 2012-10-09 LAB — PROTIME-INR: Prothrombin Time: 23 seconds — ABNORMAL HIGH (ref 11.6–15.2)

## 2012-10-09 LAB — BASIC METABOLIC PANEL
BUN: 8 mg/dL (ref 6–23)
Chloride: 98 mEq/L (ref 96–112)
GFR calc Af Amer: >90 mL/min (ref >90)
Potassium: 3.8 mEq/L (ref 3.5–5.1)

## 2012-10-09 LAB — CBC
HCT: 37 % (ref 36.0–46.0)
Hemoglobin: 12.6 g/dL (ref 12.0–15.0)
MCHC: 34.1 g/dL (ref 30.0–36.0)

## 2012-10-09 MED ORDER — IBUPROFEN 400 MG PO TABS
400.0000 mg | ORAL_TABLET | Freq: Once | ORAL | Status: AC
  Administered 2012-10-09: 400 mg via ORAL
  Filled 2012-10-09: qty 1

## 2012-10-09 MED ORDER — ENOXAPARIN SODIUM 80 MG/0.8ML ~~LOC~~ SOLN
1.0000 mg/kg | Freq: Two times a day (BID) | SUBCUTANEOUS | Status: DC
  Administered 2012-10-09 – 2012-10-12 (×6): 75 mg via SUBCUTANEOUS
  Filled 2012-10-09 (×8): qty 0.8

## 2012-10-09 MED ORDER — PNEUMOCOCCAL 13-VAL CONJ VACC IM SUSP
0.5000 mL | INTRAMUSCULAR | Status: AC
  Administered 2012-10-10: 0.5 mL via INTRAMUSCULAR
  Filled 2012-10-09: qty 0.5

## 2012-10-09 MED ORDER — POLYETHYLENE GLYCOL 3350 17 G PO PACK
17.0000 g | PACK | Freq: Two times a day (BID) | ORAL | Status: DC
  Administered 2012-10-09 – 2012-10-10 (×3): 17 g via ORAL
  Filled 2012-10-09 (×12): qty 1

## 2012-10-09 MED ORDER — WARFARIN SODIUM 7.5 MG PO TABS
7.5000 mg | ORAL_TABLET | Freq: Once | ORAL | Status: DC
  Filled 2012-10-09: qty 1

## 2012-10-09 NOTE — Progress Notes (Signed)
ANTICOAGULATION CONSULT NOTE - Follow Up  Pharmacy Consult for Warfarin Indication: pulmonary embolus (plus L leg DVT?)  Allergies  Allergen Reactions  . Vicodin (Hydrocodone-Acetaminophen) Other (See Comments)    Shaking     Patient Measurements: Height: 5\' 3"  (160 cm) Weight: 162 lb 4.1 oz (73.6 kg) IBW/kg (Calculated) : 52.4  Labs:  Recent Labs  10/07/12 0420 10/08/12 0425 10/09/12 0433  HGB  --   --  12.6  HCT  --   --  37.0  PLT  --   --  329  LABPROT 16.9* 22.7* 23.0*  INR 1.41 2.10* 2.14*  CREATININE  --   --  0.73   Estimated Creatinine Clearance: 71.9 ml/min (by C-G formula based on Cr of 0.73).  Medications:  No prescriptions prior to admission   Scheduled:  . docusate sodium  100 mg Oral BID  . enoxaparin (LOVENOX) injection  1 mg/kg Subcutaneous Q12H  . levalbuterol  0.63 mg Nebulization TID  . nicotine  14 mg Transdermal Daily  . pantoprazole  40 mg Oral Daily  . polyethylene glycol  17 g Oral BID  . sodium chloride  3 mL Intravenous Q12H  . [COMPLETED] warfarin  5 mg Oral ONCE-1800  . Warfarin - Pharmacist Dosing Inpatient   Does not apply q1800  . [DISCONTINUED] polyethylene glycol  17 g Oral Daily    Assessment:  62 yo F admitted 3/9 with chest pain and left leg swelling. CT revealed PE.  Lovenox 1mg /kg q12h and Coumadin started 3/9. Per CHEST guidelines, patient will need 5 day overlap and INR 2 or greater x 24hrs before stopping Lovenox.  Today is Day #5 of 5 Day minimun overlap.  INR is therapeutic again this am now x 24hr after doses of 7.5mg , 7.5mg , 7.5mg , 5mg   No bleeding reported in chart notes   Goal of Therapy:  INR 2-3 Monitor platelets by anticoagulation protocol: Yes   Plan:  1) D/C Lovenox after today's doses to complete 5 days of overlap and INR>2 x 24hr 2) Warfarin 7.5mg  tonight 3) Daily INR   Hessie Knows, PharmD, BCPS Pager [phone] 10/09/2012 1:18 PM

## 2012-10-09 NOTE — Progress Notes (Signed)
[facility] PROGRESS NOTE  Vanessa Campos ZOX:[id number] DOB: [birth year] DOA: 10/05/2012 PCP: Per Patient No Pcp  Assessment/Plan:  #1 acute PE/left lower extremity DVT Questionable etiology. Will place on full dose Lovenox. Will hold Coumadin. Until INR is subtherapeutic so patient may obtain a biopsy of the thyroid. Once biopsies done Coumadin may be resumed.   #2 thyroid nodule Thyroid ultrasound shows a heterogeneous thyroid nodule. Will likely need a FNA biopsy. Patient has been seen by interventional radiology and recommend patient be off Coumadin prior to biopsy. Will place patient on full dose Lovenox. Will hold Coumadin. Once INR is subtherapeutic Will likely get FNA.   #3 lung nodules and in an and maximum and Will need followup CT in 12 months.  #4 gastroesophageal reflux disease PPI  #5 tobacco abuse Tobacco cessation.  Code Status: Full Family Communication: Updated patient at bedside. Disposition Plan: Home when medically stable.   Consultants:  None  Procedures:  Thyroid ultrasound 10/07/2012  Lower extremity Dopplers 10/06/2012  CT of the chest 10/05/2012  Chest x-ray 10/05/2012  Antibiotics:  None  HPI/Subjective: Patient complaining of pleuritic right-sided chest pain with some improvement since yesterday.   Objective: Filed Vitals:   10/09/12 0535 10/09/12 0820 10/09/12 1500 10/09/12 1545  BP: 113/69  113/67   Pulse: 73  73   Temp: 100.4 F (38 C)  99 F (37.2 C)   TempSrc: Oral  Oral   Resp: 20  18   Height:      Weight:      SpO2: 97% 97% 98% 97%    Intake/Output Summary (Last 24 hours) at 10/09/12 1722 Last data filed at 10/09/12 1300  Gross per 24 hour  Intake    480 ml  Output    600 ml  Net   -120 ml   Filed Weights   10/05/12 1725 10/05/12 1919  Weight: 72.717 kg (160 lb 5 oz) 73.6 kg (162 lb 4.1 oz)    Exam:   General:  NAD  Cardiovascular: RRR. No lower extremity edema.  Respiratory: Shallow breath  sounds. No wheezing.  Abdomen: Soft, nontender, nondistended, positive bowel sounds.   Data Reviewed: Basic Metabolic Panel:  Recent Labs Lab 10/05/12 1425 10/09/12 0433  NA 137 137  K 3.9 3.8  CL 100 98  CO2 25 30  GLUCOSE 104* 96  BUN 7 8  CREATININE 0.82 0.73  CALCIUM 9.2 9.2   Liver Function Tests: No results found for this basename: AST, ALT, ALKPHOS, BILITOT, PROT, ALBUMIN,  in the last 168 hours No results found for this basename: LIPASE, AMYLASE,  in the last 168 hours No results found for this basename: AMMONIA,  in the last 168 hours CBC:  Recent Labs Lab 10/05/12 1425 10/06/12 0506 10/09/12 0433  WBC 13.3* 12.3* 8.3  HGB 14.4 13.1 12.6  HCT 41.6 39.2 37.0  MCV 82.5 83.8 81.0  PLT 262 233 329   Cardiac Enzymes: No results found for this basename: CKTOTAL, CKMB, CKMBINDEX, TROPONINI,  in the last 168 hours BNP (last 3 results) No results found for this basename: PROBNP,  in the last 8760 hours CBG:  Recent Labs Lab 10/06/12 0746  GLUCAP 96    Recent Results (from the past 240 hour(s))  URINE CULTURE     Status: None   Collection Time    10/07/12  7:30 AM      Result Value Range Status   Specimen Description URINE, CLEAN CATCH   Final   Special Requests  NONE   Final   Culture  Setup Time 10/07/2012 16:30   Final   Colony Count 65,000 COLONIES/ML   Final   Culture     Final   Value: Multiple bacterial morphotypes present, none predominant. Suggest appropriate recollection if clinically indicated.   Report Status 10/08/2012 FINAL   Final     Studies: No results found.  Scheduled Meds: . docusate sodium  100 mg Oral BID  . enoxaparin (LOVENOX) injection  1 mg/kg Subcutaneous Q12H  . levalbuterol  0.63 mg Nebulization TID  . nicotine  14 mg Transdermal Daily  . pantoprazole  40 mg Oral Daily  . [START ON 10/10/2012] pneumococcal 13-valent conjugate vaccine  0.5 mL Intramuscular Tomorrow-1000  . polyethylene glycol  17 g Oral BID  . sodium  chloride  3 mL Intravenous Q12H  . Warfarin - Pharmacist Dosing Inpatient   Does not apply q1800   Continuous Infusions:   Principal Problem:   Acute pulmonary embolism Active Problems:   Left leg pain   Tobacco use disorder   GERD (gastroesophageal reflux disease)   Thyroid nodule   Unspecified constipation    Time spent: > 30 mins    [facility]  [facility] Pager [phone]. If 7PM-7AM, please contact night-coverage at [email or web address], password [facility] 10/09/2012, 5:22 PM  LOS: 4 days

## 2012-10-09 NOTE — Progress Notes (Signed)
Patient ID: Vanessa Campos, female   DOB: 16-Oct-1950, 62 y.o.   MRN: [id number] Request received for US guided left thyroid nodule bx on pt. Imaging studies were reviewed by Dr. Lowella Dandy and nodule is amenable to bx. Pt is currently on coumadin and lovenox for acute PE/DVT. PT/INR today are 23.0/2.14. Pt will need to be off coumadin for at least 3-4 days and with normal INR before bx can be done. Lovenox may be continued up until day of bx. If desired pt can be set up for bx as OP to be done at our IR clinic on [facility] [phone]). Pt aware of above.

## 2012-10-10 ENCOUNTER — Inpatient Hospital Stay (HOSPITAL_COMMUNITY): Payer: 59

## 2012-10-10 LAB — CBC
HCT: 38.5 % (ref 36.0–46.0)
Hemoglobin: 13 g/dL (ref 12.0–15.0)
MCHC: 33.8 g/dL (ref 30.0–36.0)
WBC: 8.7 10*3/uL (ref 4.0–10.5)

## 2012-10-10 LAB — PROTIME-INR: INR: 2.44 — ABNORMAL HIGH (ref 0.00–1.49)

## 2012-10-10 LAB — URINALYSIS, ROUTINE W REFLEX MICROSCOPIC
Bilirubin Urine: NEGATIVE
Glucose, UA: 100 mg/dL — AB
Specific Gravity, Urine: 1.016 (ref 1.005–1.030)
pH: 6.5 (ref 5.0–8.0)

## 2012-10-10 LAB — BASIC METABOLIC PANEL
BUN: 8 mg/dL (ref 6–23)
CO2: 29 mEq/L (ref 19–32)
Chloride: 99 mEq/L (ref 96–112)
Glucose, Bld: 89 mg/dL (ref 70–99)
Potassium: 3.6 mEq/L (ref 3.5–5.1)

## 2012-10-10 MED ORDER — KETOROLAC TROMETHAMINE 30 MG/ML IJ SOLN
30.0000 mg | Freq: Four times a day (QID) | INTRAMUSCULAR | Status: DC | PRN
  Administered 2012-10-12 – 2012-10-13 (×3): 30 mg via INTRAVENOUS
  Filled 2012-10-10 (×3): qty 1

## 2012-10-10 MED ORDER — LEVALBUTEROL HCL 0.63 MG/3ML IN NEBU
0.6300 mg | INHALATION_SOLUTION | Freq: Two times a day (BID) | RESPIRATORY_TRACT | Status: DC
  Administered 2012-10-10 – 2012-10-14 (×8): 0.63 mg via RESPIRATORY_TRACT
  Filled 2012-10-10 (×10): qty 3

## 2012-10-10 MED ORDER — VITAMINS A & D EX OINT
TOPICAL_OINTMENT | CUTANEOUS | Status: AC
  Administered 2012-10-10: 15:00:00
  Filled 2012-10-10: qty 5

## 2012-10-10 MED ORDER — TRAMADOL HCL 50 MG PO TABS
50.0000 mg | ORAL_TABLET | Freq: Four times a day (QID) | ORAL | Status: DC | PRN
  Administered 2012-10-10 – 2012-10-14 (×8): 50 mg via ORAL
  Filled 2012-10-10 (×8): qty 1

## 2012-10-10 NOTE — Progress Notes (Signed)
[facility] PROGRESS NOTE  Vanessa Campos OZH:[id number] DOB: [birth year] DOA: 10/05/2012 PCP: Per Patient No Pcp  Assessment/Plan:  #1 acute PE/left lower extremity DVT Questionable etiology. Continue full dose Lovenox. Will hold Coumadin. Until INR is subtherapeutic so patient may obtain a biopsy of the thyroid. Once biopsies done Coumadin may be resumed.   #2 thyroid nodule Thyroid ultrasound shows a heterogeneous thyroid nodule. Will likely need a FNA biopsy. Patient has been seen by interventional radiology and recommend patient be off Coumadin prior to biopsy. Continue full dose Lovenox. Will hold Coumadin. Once INR is subtherapeutic Will likely get FNA.   #3 lung nodules and in an and maximum and Will need followup CT in 12 months.  #4 gastroesophageal reflux disease PPI  #5 fever Likely secondary to problem #1. We'll repeat a UA with cultures and sensitivities. Will get a chest x-ray. Follow.  #6 tobacco abuse Tobacco cessation.  Code Status: Full Family Communication: Updated patient at bedside. Disposition Plan: Home when medically stable.   Consultants:  None  Procedures:  Thyroid ultrasound 10/07/2012  Lower extremity Dopplers 10/06/2012  CT of the chest 10/05/2012  Chest x-ray 10/05/2012  Antibiotics:  None  HPI/Subjective: Patient complaining of pleuritic right-sided chest pain.   Objective: Filed Vitals:   10/09/12 2135 10/10/12 0516 10/10/12 0851 10/10/12 1400  BP: 107/62 111/64  102/74  Pulse: 76 77  78  Temp: 100.6 F (38.1 C) 98.1 F (36.7 C)  98.5 F (36.9 C)  TempSrc: Oral Oral  Oral  Resp: 18 18  18   Height:      Weight:      SpO2: 96% 100% 95% 98%    Intake/Output Summary (Last 24 hours) at 10/10/12 1841 Last data filed at 10/10/12 0854  Gross per 24 hour  Intake    483 ml  Output      0 ml  Net    483 ml   Filed Weights   10/05/12 1725 10/05/12 1919  Weight: 72.717 kg (160 lb 5 oz) 73.6 kg (162 lb 4.1 oz)     Exam:   General:  NAD  Cardiovascular: RRR. No lower extremity edema.  Respiratory: Shallow breath sounds. No wheezing.  Abdomen: Soft, nontender, nondistended, positive bowel sounds.   Data Reviewed: Basic Metabolic Panel:  Recent Labs Lab 10/05/12 1425 10/09/12 0433 10/10/12 0440  NA 137 137 136  K 3.9 3.8 3.6  CL 100 98 99  CO2 25 30 29   GLUCOSE 104* 96 89  BUN 7 8 8   CREATININE 0.82 0.73 0.76  CALCIUM 9.2 9.2 9.1   Liver Function Tests: No results found for this basename: AST, ALT, ALKPHOS, BILITOT, PROT, ALBUMIN,  in the last 168 hours No results found for this basename: LIPASE, AMYLASE,  in the last 168 hours No results found for this basename: AMMONIA,  in the last 168 hours CBC:  Recent Labs Lab 10/05/12 1425 10/06/12 0506 10/09/12 0433 10/10/12 0440  WBC 13.3* 12.3* 8.3 8.7  HGB 14.4 13.1 12.6 13.0  HCT 41.6 39.2 37.0 38.5  MCV 82.5 83.8 81.0 81.9  PLT 262 233 329 389   Cardiac Enzymes: No results found for this basename: CKTOTAL, CKMB, CKMBINDEX, TROPONINI,  in the last 168 hours BNP (last 3 results) No results found for this basename: PROBNP,  in the last 8760 hours CBG:  Recent Labs Lab 10/06/12 0746  GLUCAP 96    Recent Results (from the past 240 hour(s))  URINE CULTURE     Status:  None   Collection Time    10/07/12  7:30 AM      Result Value Range Status   Specimen Description URINE, CLEAN CATCH   Final   Special Requests NONE   Final   Culture  Setup Time 10/07/2012 16:30   Final   Colony Count 65,000 COLONIES/ML   Final   Culture     Final   Value: Multiple bacterial morphotypes present, none predominant. Suggest appropriate recollection if clinically indicated.   Report Status 10/08/2012 FINAL   Final     Studies: Dg Chest Port 1 View  10/10/2012  *RADIOLOGY REPORT*  Clinical Data: Fever. Recent pulmonary emboli.  PORTABLE CHEST - 1 VIEW  Comparison: Chest x-ray dated 10/05/2012  Findings: The patient has new bibasilar  atelectasis and tiny effusions.  No consolidative infiltrates.  Heart size and vascularity are normal.  IMPRESSION: New tiny effusions and slight bibasilar atelectasis.  Given the extent of pulmonary emboli in the right lower lobe, the possibility of pulmonary infarction should be considered.   Original Report Authenticated By: Francene Boyers, M.D.     Scheduled Meds: . docusate sodium  100 mg Oral BID  . enoxaparin (LOVENOX) injection  1 mg/kg Subcutaneous Q12H  . levalbuterol  0.63 mg Nebulization BID  . nicotine  14 mg Transdermal Daily  . pantoprazole  40 mg Oral Daily  . polyethylene glycol  17 g Oral BID  . sodium chloride  3 mL Intravenous Q12H   Continuous Infusions:   Principal Problem:   Acute pulmonary embolism Active Problems:   Left leg pain   Tobacco use disorder   GERD (gastroesophageal reflux disease)   Thyroid nodule   Unspecified constipation    Time spent: > 30 mins    [facility]  [facility] Pager [phone]. If 7PM-7AM, please contact night-coverage at [email or web address], password [facility] 10/10/2012, 6:41 PM  LOS: 5 days

## 2012-10-10 NOTE — Progress Notes (Signed)
BENEFIT CHECK FOR LOVENOX- patient has private insurance with [facility]; Independent prior to admission' pt copay will be $40 for brand;$25 for genric per certified medical assistance

## 2012-10-11 DIAGNOSIS — I959 Hypotension, unspecified: Secondary | ICD-10-CM

## 2012-10-11 LAB — CBC
HCT: 38.3 % (ref 36.0–46.0)
Hemoglobin: 13.1 g/dL (ref 12.0–15.0)
MCH: 27.7 pg (ref 26.0–34.0)
MCHC: 34.2 g/dL (ref 30.0–36.0)

## 2012-10-11 LAB — BASIC METABOLIC PANEL
BUN: 11 mg/dL (ref 6–23)
Calcium: 9.5 mg/dL (ref 8.4–10.5)
GFR calc non Af Amer: 90 mL/min — ABNORMAL LOW (ref >90)
Glucose, Bld: 89 mg/dL (ref 70–99)
Sodium: 133 mEq/L — ABNORMAL LOW (ref 135–145)

## 2012-10-11 MED ORDER — PHYTONADIONE 5 MG PO TABS
2.5000 mg | ORAL_TABLET | Freq: Once | ORAL | Status: AC
  Administered 2012-10-11: 2.5 mg via ORAL
  Filled 2012-10-11: qty 1

## 2012-10-11 MED ORDER — SODIUM CHLORIDE 0.9 % IV BOLUS (SEPSIS)
1000.0000 mL | Freq: Once | INTRAVENOUS | Status: AC
  Administered 2012-10-11: 1000 mL via INTRAVENOUS

## 2012-10-11 MED ORDER — SODIUM CHLORIDE 0.9 % IV SOLN: 250.0000 mL | INTRAVENOUS | Status: DC | PRN

## 2012-10-11 MED ORDER — SODIUM CHLORIDE 0.9 % IV SOLN
INTRAVENOUS | Status: DC
  Administered 2012-10-11 – 2012-10-14 (×7): via INTRAVENOUS

## 2012-10-11 NOTE — Progress Notes (Addendum)
Patient had a nose bleed.  Checked vital signs and paged Md.  Advised by Md to leave patient on room air for SAT O2 at or > 93%.

## 2012-10-11 NOTE — Plan of Care (Signed)
Problem: Phase III Progression Outcomes Goal: Voiding independently Outcome: Progressing Stand by assist

## 2012-10-11 NOTE — Progress Notes (Signed)
[facility] PROGRESS NOTE  Vanessa Campos ZOX:[id number] DOB: 1951/04/17 DOA: 10/05/2012 PCP: Per Patient No Pcp  Assessment/Plan:  #1 acute PE/left lower extremity DVT Questionable etiology. Continue full dose Lovenox. Will hold Coumadin. Until INR is subtherapeutic so patient may obtain a biopsy of the thyroid. Once biopsies done Coumadin may be resumed.   #2 thyroid nodule Thyroid ultrasound shows a heterogeneous thyroid nodule. Will likely need a FNA biopsy. Patient has been seen by interventional radiology and recommend patient be off Coumadin prior to biopsy. Continue full dose Lovenox. Will continue to hold Coumadin. Vitamin K 2.5mg   X 1. Once INR is subtherapeutic Will likely get FNA.   #3 lung nodules  Will need followup CT in 12 months.  #4 gastroesophageal reflux disease PPI  #5 fever Likely secondary to problem #1.  UA negative.  Chest x-ray negative. Follow.  #6 hypotension Likely secondary to volume depletion. Will give a bolus of normal saline. Continue IV fluids at 75 cc per hour. Follow.  #7 tobacco abuse Tobacco cessation.  Code Status: Full Family Communication: Updated patient at bedside. Disposition Plan: Home when medically stable.   Consultants:  None  Procedures:  Thyroid ultrasound 10/07/2012  Lower extremity Dopplers 10/06/2012  CT of the chest 10/05/2012  Chest x-ray 10/05/2012  Antibiotics:  None  HPI/Subjective: Patient complaining of pleuritic right-sided chest pain. Patient with a hypertensive episode this morning. Patient with slight nasal bleeding per nursing.  Objective: Filed Vitals:   10/11/12 0501 10/11/12 0834 10/11/12 1100 10/11/12 1339  BP: 91/64  83/70 110/64  Pulse: 78  78 76  Temp: 100 F (37.8 C)  98.5 F (36.9 C)   TempSrc: Oral  Oral   Resp: 24  21   Height:      Weight:      SpO2: 94% 93% 95% 97%   No intake or output data in the 24 hours ending 10/11/12 1347 Filed Weights   10/05/12 1725  10/05/12 1919  Weight: 72.717 kg (160 lb 5 oz) 73.6 kg (162 lb 4.1 oz)    Exam:   General:  NAD  Cardiovascular: RRR. No lower extremity edema.  Respiratory: Shallow breath sounds. No wheezing.  Abdomen: Soft, nontender, nondistended, positive bowel sounds.   Data Reviewed: Basic Metabolic Panel:  Recent Labs Lab 10/05/12 1425 10/09/12 0433 10/10/12 0440 10/11/12 0533  NA 137 137 136 133*  K 3.9 3.8 3.6 3.8  CL 100 98 99 95*  CO2 25 30 29 28   GLUCOSE 104* 96 89 89  BUN 7 8 8 11   CREATININE 0.82 0.73 0.76 0.76  CALCIUM 9.2 9.2 9.1 9.5   Liver Function Tests: No results found for this basename: AST, ALT, ALKPHOS, BILITOT, PROT, ALBUMIN,  in the last 168 hours No results found for this basename: LIPASE, AMYLASE,  in the last 168 hours No results found for this basename: AMMONIA,  in the last 168 hours CBC:  Recent Labs Lab 10/05/12 1425 10/06/12 0506 10/09/12 0433 10/10/12 0440 10/11/12 0533  WBC 13.3* 12.3* 8.3 8.7 10.6*  HGB 14.4 13.1 12.6 13.0 13.1  HCT 41.6 39.2 37.0 38.5 38.3  MCV 82.5 83.8 81.0 81.9 81.0  PLT 262 233 329 389 436*   Cardiac Enzymes: No results found for this basename: CKTOTAL, CKMB, CKMBINDEX, TROPONINI,  in the last 168 hours BNP (last 3 results) No results found for this basename: PROBNP,  in the last 8760 hours CBG:  Recent Labs Lab 10/06/12 0746  GLUCAP 96  Recent Results (from the past 240 hour(s))  URINE CULTURE     Status: None   Collection Time    10/07/12  7:30 AM      Result Value Range Status   Specimen Description URINE, CLEAN CATCH   Final   Special Requests NONE   Final   Culture  Setup Time 10/07/2012 16:30   Final   Colony Count 65,000 COLONIES/ML   Final   Culture     Final   Value: Multiple bacterial morphotypes present, none predominant. Suggest appropriate recollection if clinically indicated.   Report Status 10/08/2012 FINAL   Final     Studies: Dg Chest Port 1 View  10/10/2012  *RADIOLOGY  REPORT*  Clinical Data: Fever. Recent pulmonary emboli.  PORTABLE CHEST - 1 VIEW  Comparison: Chest x-ray dated 10/05/2012  Findings: The patient has new bibasilar atelectasis and tiny effusions.  No consolidative infiltrates.  Heart size and vascularity are normal.  IMPRESSION: New tiny effusions and slight bibasilar atelectasis.  Given the extent of pulmonary emboli in the right lower lobe, the possibility of pulmonary infarction should be considered.   Original Report Authenticated By: Francene Boyers, M.D.     Scheduled Meds: . docusate sodium  100 mg Oral BID  . enoxaparin (LOVENOX) injection  1 mg/kg Subcutaneous Q12H  . levalbuterol  0.63 mg Nebulization BID  . nicotine  14 mg Transdermal Daily  . pantoprazole  40 mg Oral Daily  . polyethylene glycol  17 g Oral BID  . sodium chloride  3 mL Intravenous Q12H   Continuous Infusions:   Principal Problem:   Acute pulmonary embolism Active Problems:   Left leg pain   Tobacco use disorder   GERD (gastroesophageal reflux disease)   Thyroid nodule   Unspecified constipation   Hypotension, unspecified    Time spent: > 30 mins    [facility]  [facility] Pager [phone]. If 7PM-7AM, please contact night-coverage at [email or web address], password [facility] 10/11/2012, 1:47 PM  LOS: 6 days

## 2012-10-11 NOTE — Progress Notes (Signed)
ANTICOAGULATION CONSULT NOTE - Follow Up  Pharmacy Consult for Warfarin Indication: pulmonary embolus (plus L leg DVT?)  Allergies  Allergen Reactions  . Vicodin (Hydrocodone-Acetaminophen) Other (See Comments)    Shaking     Patient Measurements: Height: 5\' 3"  (160 cm) Weight: 162 lb 4.1 oz (73.6 kg) IBW/kg (Calculated) : 52.4  Labs:  Recent Labs  10/09/12 0433 10/10/12 0440 10/11/12 0533  HGB 12.6 13.0 13.1  HCT 37.0 38.5 38.3  PLT 329 389 436*  LABPROT 23.0* 25.4* 28.4*  INR 2.14* 2.44* 2.84*  CREATININE 0.73 0.76 0.76   Estimated Creatinine Clearance: 71.9 ml/min (by C-G formula based on Cr of 0.76).  Medications:  No prescriptions prior to admission   Scheduled:  . docusate sodium  100 mg Oral BID  . enoxaparin (LOVENOX) injection  1 mg/kg Subcutaneous Q12H  . levalbuterol  0.63 mg Nebulization BID  . nicotine  14 mg Transdermal Daily  . pantoprazole  40 mg Oral Daily  . [COMPLETED] phytonadione  2.5 mg Oral Once  . [COMPLETED] pneumococcal 13-valent conjugate vaccine  0.5 mL Intramuscular Tomorrow-1000  . polyethylene glycol  17 g Oral BID  . sodium chloride  3 mL Intravenous Q12H  . [COMPLETED] vitamin A & D      . [DISCONTINUED] levalbuterol  0.63 mg Nebulization TID    Assessment:  62 yo F admitted 3/9 with chest pain and left leg swelling. CT revealed PE.  Completed 5 day overlap of warfarin/lovenox  Warfarin is on hold for FNA of thyroid nodule once INR is subtherapeutic  Full dose lovenox continues  INR continues to rise (2.84) despite last dose of warfarin was on 3/12, MD ordered Vitamin K 2.5mg  PO this morning.  CBC and renal function stable, no bleeding reported in chart notes.   Goal of Therapy:  INR 2-3 Monitor platelets by anticoagulation protocol: Yes   Plan per MD:  Warfarin remains on hold, full dose lovenox continues  F/U for warfarin resume after biopsy  Loralee Pacas, PharmD, BCPS Pager: [phone]  10/11/2012 9:25  AM

## 2012-10-12 LAB — BASIC METABOLIC PANEL
BUN: 11 mg/dL (ref 6–23)
Chloride: 100 mEq/L (ref 96–112)
Glucose, Bld: 82 mg/dL (ref 70–99)
Potassium: 3.6 mEq/L (ref 3.5–5.1)

## 2012-10-12 LAB — URINE CULTURE

## 2012-10-12 LAB — CBC
HCT: 34.6 % — ABNORMAL LOW (ref 36.0–46.0)
Hemoglobin: 11.8 g/dL — ABNORMAL LOW (ref 12.0–15.0)
MCH: 27.8 pg (ref 26.0–34.0)
MCHC: 34.1 g/dL (ref 30.0–36.0)

## 2012-10-12 MED ORDER — ENOXAPARIN SODIUM 80 MG/0.8ML ~~LOC~~ SOLN
1.0000 mg/kg | Freq: Two times a day (BID) | SUBCUTANEOUS | Status: DC
  Administered 2012-10-13 – 2012-10-14 (×3): 75 mg via SUBCUTANEOUS
  Filled 2012-10-12 (×6): qty 0.8

## 2012-10-12 NOTE — Plan of Care (Signed)
Patient ambulated in the hall two times around.  No assistive devices.  SAT 02 91-94% on RA.

## 2012-10-12 NOTE — Progress Notes (Signed)
Pt had minor nosebleed 10/12/12, 2230.  Pressure and ice applied, controlled.  [facility] RN-BC

## 2012-10-12 NOTE — Evaluation (Signed)
Physical Therapy One Time Evaluation Patient Details Name: Vanessa Campos MRN: [id number] DOB: 1951-07-02 Today's Date: 10/12/2012 Time: 4098-1191 PT Time Calculation (min): 9 min  PT Assessment / Plan / Recommendation Clinical Impression  Pt presents with acute PE/LLE DVT with SOB.  Tolerated ambulation in hallway very well with no LOB and without any AD.  Ambulated on RA with SaO2 at 94-95% throughout ambulation.  Pt will not require any further follow up in acute or at home.  PT to sign off at this time.     PT Assessment  Patent does not need any further PT services    Follow Up Recommendations  No PT follow up    Does the patient have the potential to tolerate intense rehabilitation      Barriers to Discharge        Equipment Recommendations  None recommended by PT    Recommendations for Other Services     Frequency      Precautions / Restrictions Precautions Precautions: None Restrictions Weight Bearing Restrictions: No   Pertinent Vitals/Pain No pain      Mobility  Bed Mobility Bed Mobility: Not assessed Transfers Transfers: Sit to Stand;Stand to Sit Sit to Stand: 7: Independent Stand to Sit: 7: Independent Ambulation/Gait Ambulation/Gait Assistance: 6: Modified independent (Device/Increase time) Ambulation Distance (Feet): 500 Feet Assistive device: None Gait Pattern: Within Functional Limits Stairs: No Wheelchair Mobility Wheelchair Mobility: No    Exercises     PT Diagnosis:    PT Problem List:   PT Treatment Interventions:     PT Goals    Visit Information  Last PT Received On: 10/12/12 Assistance Needed: +1    Subjective Data  Subjective: I want to walk.  Patient Stated Goal: to return home.    Prior Functioning  Home Living Lives With: Daughter Available Help at Discharge: Family;Available 24 hours/day Type of Home: House Home Access: Level entry Home Layout: One level Bathroom Shower/Tub: Engineer, manufacturing systems:  Standard Home Adaptive Equipment: None Prior Function Level of Independence: Independent Able to Take Stairs?: Yes Driving: Yes Vocation: Retired Musician: No difficulties    Copywriter, advertising Overall Cognitive Status: Appears within functional limits for tasks assessed/performed Arousal/Alertness: Awake/alert Orientation Level: Appears intact for tasks assessed Behavior During Session: [facility] for tasks performed    Extremity/Trunk Assessment Right Lower Extremity Assessment RLE ROM/Strength/Tone: Within functional levels RLE Sensation: WFL - Light Touch RLE Coordination: WFL - gross/fine motor Left Lower Extremity Assessment LLE Sensation: WFL - Light Touch LLE Coordination: WFL - gross/fine motor Trunk Assessment Trunk Assessment: Normal   Balance    End of Session PT - End of Session Activity Tolerance: Patient tolerated treatment well Patient left: in chair;with call bell/phone within reach;with family/visitor present Nurse Communication: Mobility status  GP     Vista Deck 10/12/2012, 4:48 PM

## 2012-10-12 NOTE — Progress Notes (Signed)
ANTICOAGULATION CONSULT NOTE - Follow Up  Pharmacy Consult for Warfarin Indication: pulmonary embolus (plus L leg DVT?)  Allergies  Allergen Reactions  . Vicodin (Hydrocodone-Acetaminophen) Other (See Comments)    Shaking     Patient Measurements: Height: 5\' 3"  (160 cm) Weight: 162 lb 4.1 oz (73.6 kg) IBW/kg (Calculated) : 52.4  Labs:  Recent Labs  10/10/12 0440 10/11/12 0533 10/12/12 0521  HGB 13.0 13.1 11.8*  HCT 38.5 38.3 34.6*  PLT 389 436* 446*  LABPROT 25.4* 28.4* 15.4*  INR 2.44* 2.84* 1.24  CREATININE 0.76 0.76 0.72   Estimated Creatinine Clearance: 71.9 ml/min (by C-G formula based on Cr of 0.72).  Medications:  No prescriptions prior to admission   Scheduled:  . docusate sodium  100 mg Oral BID  . enoxaparin (LOVENOX) injection  1 mg/kg Subcutaneous Q12H  . levalbuterol  0.63 mg Nebulization BID  . nicotine  14 mg Transdermal Daily  . pantoprazole  40 mg Oral Daily  . polyethylene glycol  17 g Oral BID  . [COMPLETED] sodium chloride  1,000 mL Intravenous Once  . sodium chloride  3 mL Intravenous Q12H  . [DISCONTINUED] enoxaparin (LOVENOX) injection  1 mg/kg Subcutaneous Q12H    Assessment:  62 yo F admitted 3/9 with chest pain and left leg swelling. CT revealed PE.  Completed 5 day overlap of warfarin/lovenox  Warfarin is on hold for FNA of thyroid nodule once INR is subtherapeutic  INR now down 1.24 after Vitamin K 2.5mg  po yesterday. Full dose lovenox continues  H/H decreased, Pltc and renal function stable, no bleeding reported in chart notes.   Goal of Therapy:  INR 2-3 Monitor platelets by anticoagulation protocol: Yes   Plan per MD:  Warfarin remains on hold, full dose lovenox continues  MD please advise when ok to resume warfarin, Pharmacy will s/o until then  Loralee Pacas, PharmD, BCPS Pager: [phone]  10/12/2012 9:36 AM

## 2012-10-12 NOTE — Progress Notes (Signed)
[facility] PROGRESS NOTE  Vanessa Campos GNF:[id number] DOB: [birth year] DOA: 10/05/2012 PCP: Per Patient No Pcp  Assessment/Plan:  #1 acute PE/left lower extremity DVT Questionable etiology. Continue full dose Lovenox. Will hold Coumadin. Until INR is subtherapeutic so patient may obtain a biopsy of the thyroid. Once biopsies done Coumadin may be resumed. IR to advice when to resume coumadin.  #2 thyroid nodule Thyroid ultrasound shows a heterogeneous thyroid nodule. Will likely need a FNA biopsy. Patient has been seen by interventional radiology and recommend patient be off Coumadin prior to biopsy. Continue full dose Lovenox. Will continue to hold Coumadin. Vitamin K 2.5mg   X 1 yesterday. INR today is 1.24. IR ff for FNA.   #3 lung nodules  Will need followup CT in 12 months.  #4 gastroesophageal reflux disease PPI  #5 fever Likely secondary to problem #1.  Afebrile x 24 hrs. UA negative.  Chest x-ray negative. Follow.  #6 hypotension Likely secondary to volume depletion. Improved with IVF. Follow.  #7 tobacco abuse Tobacco cessation.  Code Status: Full Family Communication: Updated patient at bedside. Disposition Plan: Home when medically stable.   Consultants:  None  Procedures:  Thyroid ultrasound 10/07/2012  Lower extremity Dopplers 10/06/2012  CT of the chest 10/05/2012  Chest x-ray 10/05/2012  Antibiotics:  None  HPI/Subjective: Patient states pleuritic right-sided chest pain improving. Patient with slight nasal bleeding per nursing.  Objective: Filed Vitals:   10/11/12 1945 10/11/12 2144 10/12/12 0510 10/12/12 0907  BP:  101/60 105/64   Pulse:  72 70   Temp:  99.2 F (37.3 C) 98.5 F (36.9 C)   TempSrc:  Oral Oral   Resp:  18 18   Height:      Weight:      SpO2: 95% 96% 95% 96%    Intake/Output Summary (Last 24 hours) at 10/12/12 1030 Last data filed at 10/12/12 0544  Gross per 24 hour  Intake 3058.75 ml  Output   1500 ml   Net 1558.75 ml   Filed Weights   10/05/12 1725 10/05/12 1919  Weight: 72.717 kg (160 lb 5 oz) 73.6 kg (162 lb 4.1 oz)    Exam:   General:  NAD  Cardiovascular: RRR. No lower extremity edema.  Respiratory: Shallow breath sounds. No wheezing.  Abdomen: Soft, nontender, nondistended, positive bowel sounds.   Data Reviewed: Basic Metabolic Panel:  Recent Labs Lab 10/05/12 1425 10/09/12 0433 10/10/12 0440 10/11/12 0533 10/12/12 0521  NA 137 137 136 133* 134*  K 3.9 3.8 3.6 3.8 3.6  CL 100 98 99 95* 100  CO2 25 30 29 28 23   GLUCOSE 104* 96 89 89 82  BUN 7 8 8 11 11   CREATININE 0.82 0.73 0.76 0.76 0.72  CALCIUM 9.2 9.2 9.1 9.5 8.7   Liver Function Tests: No results found for this basename: AST, ALT, ALKPHOS, BILITOT, PROT, ALBUMIN,  in the last 168 hours No results found for this basename: LIPASE, AMYLASE,  in the last 168 hours No results found for this basename: AMMONIA,  in the last 168 hours CBC:  Recent Labs Lab 10/06/12 0506 10/09/12 0433 10/10/12 0440 10/11/12 0533 10/12/12 0521  WBC 12.3* 8.3 8.7 10.6* 8.7  HGB 13.1 12.6 13.0 13.1 11.8*  HCT 39.2 37.0 38.5 38.3 34.6*  MCV 83.8 81.0 81.9 81.0 81.6  PLT 233 329 389 436* 446*   Cardiac Enzymes: No results found for this basename: CKTOTAL, CKMB, CKMBINDEX, TROPONINI,  in the last 168 hours BNP (last 3 results)  No results found for this basename: PROBNP,  in the last 8760 hours CBG:  Recent Labs Lab 10/06/12 0746  GLUCAP 96    Recent Results (from the past 240 hour(s))  URINE CULTURE     Status: None   Collection Time    10/07/12  7:30 AM      Result Value Range Status   Specimen Description URINE, CLEAN CATCH   Final   Special Requests NONE   Final   Culture  Setup Time 10/07/2012 16:30   Final   Colony Count 65,000 COLONIES/ML   Final   Culture     Final   Value: Multiple bacterial morphotypes present, none predominant. Suggest appropriate recollection if clinically indicated.   Report  Status 10/08/2012 FINAL   Final  URINE CULTURE     Status: None   Collection Time    10/10/12  9:30 PM      Result Value Range Status   Specimen Description URINE, CLEAN CATCH   Final   Special Requests NONE   Final   Culture  Setup Time 10/11/2012 05:00   Final   Colony Count 9,000 COLONIES/ML   Final   Culture INSIGNIFICANT GROWTH   Final   Report Status 10/12/2012 FINAL   Final     Studies: Dg Chest Port 1 View  10/10/2012  *RADIOLOGY REPORT*  Clinical Data: Fever. Recent pulmonary emboli.  PORTABLE CHEST - 1 VIEW  Comparison: Chest x-ray dated 10/05/2012  Findings: The patient has new bibasilar atelectasis and tiny effusions.  No consolidative infiltrates.  Heart size and vascularity are normal.  IMPRESSION: New tiny effusions and slight bibasilar atelectasis.  Given the extent of pulmonary emboli in the right lower lobe, the possibility of pulmonary infarction should be considered.   Original Report Authenticated By: Francene Boyers, M.D.     Scheduled Meds: . docusate sodium  100 mg Oral BID  . enoxaparin (LOVENOX) injection  1 mg/kg Subcutaneous Q12H  . levalbuterol  0.63 mg Nebulization BID  . nicotine  14 mg Transdermal Daily  . pantoprazole  40 mg Oral Daily  . polyethylene glycol  17 g Oral BID  . sodium chloride  3 mL Intravenous Q12H   Continuous Infusions: . sodium chloride 100 mL/hr at 10/12/12 0831    Principal Problem:   Acute pulmonary embolism Active Problems:   Left leg pain   Tobacco use disorder   GERD (gastroesophageal reflux disease)   Thyroid nodule   Unspecified constipation   Hypotension, unspecified    Time spent: > 30 mins    [facility]  [facility] Pager [phone]. If 7PM-7AM, please contact night-coverage at [email or web address], password [facility] 10/12/2012, 10:30 AM  LOS: 7 days

## 2012-10-13 ENCOUNTER — Inpatient Hospital Stay (HOSPITAL_COMMUNITY): Payer: 59

## 2012-10-13 LAB — BASIC METABOLIC PANEL
BUN: 9 mg/dL (ref 6–23)
CO2: 22 mEq/L (ref 19–32)
Chloride: 104 mEq/L (ref 96–112)
Creatinine, Ser: 0.67 mg/dL (ref 0.50–1.10)

## 2012-10-13 LAB — PROTIME-INR: INR: 1.16 (ref 0.00–1.49)

## 2012-10-13 LAB — CBC
HCT: 34 % — ABNORMAL LOW (ref 36.0–46.0)
Hemoglobin: 11.7 g/dL — ABNORMAL LOW (ref 12.0–15.0)
MCV: 81 fL (ref 78.0–100.0)
RDW: 13.2 % (ref 11.5–15.5)
WBC: 8.7 10*3/uL (ref 4.0–10.5)

## 2012-10-13 MED ORDER — ENOXAPARIN (LOVENOX) PATIENT EDUCATION KIT
PACK | Freq: Once | Status: AC
  Administered 2012-10-13: 19:00:00
  Filled 2012-10-13: qty 1

## 2012-10-13 MED ORDER — WARFARIN SODIUM 7.5 MG PO TABS
7.5000 mg | ORAL_TABLET | Freq: Once | ORAL | Status: AC
  Administered 2012-10-13: 7.5 mg via ORAL
  Filled 2012-10-13: qty 1

## 2012-10-13 MED ORDER — WARFARIN - PHARMACIST DOSING INPATIENT: Freq: Every day | Status: DC

## 2012-10-13 MED ORDER — POTASSIUM CHLORIDE CRYS ER 20 MEQ PO TBCR
40.0000 meq | EXTENDED_RELEASE_TABLET | Freq: Once | ORAL | Status: AC
  Administered 2012-10-13: 40 meq via ORAL
  Filled 2012-10-13: qty 2

## 2012-10-13 NOTE — Progress Notes (Signed)
ANTICOAGULATION CONSULT NOTE - Follow Up Consult  Pharmacy Consult for Warfarin Indication: acute PE/left lower extremity DVT  Allergies  Allergen Reactions  . Vicodin (Hydrocodone-Acetaminophen) Other (See Comments)    Shaking     Patient Measurements: Height: 5\' 3"  (160 cm) Weight: 162 lb 4.1 oz (73.6 kg) IBW/kg (Calculated) : 52.4  Vital Signs: Temp: 98.5 F (36.9 C) (03/17 1440) Temp src: Oral (03/17 1440) BP: 107/55 mmHg (03/17 1735) Pulse Rate: 69 (03/17 1735)  Labs:  Recent Labs  10/11/12 0533 10/12/12 0521 10/13/12 0425  HGB 13.1 11.8* 11.7*  HCT 38.3 34.6* 34.0*  PLT 436* 446* 478*  LABPROT 28.4* 15.4* 14.6  INR 2.84* 1.24 1.16  CREATININE 0.76 0.72 0.67    Estimated Creatinine Clearance: 71.9 ml/min (by C-G formula based on Cr of 0.67).   Assessment: 61YOF admitted 3/9 with acute PE and LLE DVT.  Pt completed 5 day overlap of warfarin/lovenox then warfarin has been on hold since 3/13 to allow INR to drop for thyroid biopsy this AM.  Lovenox has been held since last night.  Now, s/p thyroid nodule biopsy this AM.  Lovenox and warfarin to be resumed this evening.  Goal of Therapy:  INR 2-3 Monitor platelets by anticoagulation protocol: Yes   Plan:  Resume Lovenox per MD. Warfarin 7.5 mg PO once tonight. Daily INR. Warfarin education completed 3/12.  Clance Boll 10/13/2012,5:49 PM

## 2012-10-13 NOTE — Progress Notes (Signed)
Occupational Therapy Discharge Patient Details Name: Vanessa Campos MRN: [id number] DOB: 1950-12-30 Today's Date: 10/13/2012 Time: 7846-9629 OT Time Calculation (min): 10 min  Patient discharged from OT services secondary to goals met and no further OT needs identified.  Please see latest therapy progress note for current level of functioning and progress toward goals.    Progress and discharge plan discussed with patient and/or caregiver: Patient/Caregiver agrees with plan  GO     Lucile Shutters Pager: [phone]  10/13/2012, 2:43 PM

## 2012-10-13 NOTE — Progress Notes (Signed)
Reviewed process for giving lovenox injections with pt and her daughter at the bedside. Pt's daughter gave pt the injection as instructed and she did great and appeared comfortable with the procedure. Pt given the lovenox starter kit for her and her family to review. Will continue to reinforce teaching and have other RN's to reinforce too.  Arta Bruce [facility] 10/13/2012

## 2012-10-13 NOTE — Evaluation (Addendum)
Occupational Therapy Evaluation Patient Details Name: Vanessa Campos MRN: [id number] DOB: 04-01-1951 Today's Date: 10/13/2012 Time: 7253-6644 OT Time Calculation (min): 10 min  OT Assessment / Plan / Recommendation Clinical Impression  62 yo female admitted with acute PE LLE DVT with SOB. Pt currently nearing baseline and does not require skilled OT acutely. OT to sign off    OT Assessment  Patient does not need any further OT services    Follow Up Recommendations  No OT follow up    Barriers to Discharge      Equipment Recommendations  None recommended by OT    Recommendations for Other Services    Frequency       Precautions / Restrictions Precautions Precautions: None Restrictions Weight Bearing Restrictions: No   Pertinent Vitals/Pain none    ADL  Eating/Feeding: Independent Where Assessed - Eating/Feeding: Bed level Grooming: Wash/dry hands;Independent Where Assessed - Grooming: Unsupported standing Toilet Transfer: Community education officer Method: Sit to Barista: Regular height toilet Toileting - Clothing Manipulation and Hygiene: Independent Where Assessed - Engineer, mining and Hygiene: Sit to stand from 3-in-1 or toilet Tub/Shower Transfer: Independent Tub/Shower Transfer Method: Science writer:  (tub - simulated with doing 2 stairs with Rt hand on wall) Equipment Used: Gait belt Transfers/Ambulation Related to ADLs: Pt ambulated >75 ft with supervision level. pt progressing well and does not demonstrate any dyspnea. Pt states "i feel good" ADL Comments: Pt demonstrates ability to reach bil LE , toilet transfer, sink level task, bed mobility and tub transfer.     OT Diagnosis:    OT Problem List:   OT Treatment Interventions:     OT Goals    Visit Information  Last OT Received On: 10/13/12 Assistance Needed: +1    Subjective Data  Subjective: "Oh yeah I have my 13 sister/  brothers and my kids" pt has larger family support system Patient Stated Goal: to return home to see grandkids   Prior Functioning     Home Living Lives With: Daughter Available Help at Discharge: Family;Available 24 hours/day Type of Home: House Home Access: Level entry Home Layout: One level Bathroom Shower/Tub: Engineer, manufacturing systems: Standard Home Adaptive Equipment: None Prior Function Level of Independence: Independent Able to Take Stairs?: Yes Driving: Yes Vocation: Retired Musician: No difficulties Dominant Hand: Right         Vision/Perception Vision - History Baseline Vision: No visual deficits Patient Visual Report: No change from baseline   Cognition  Cognition Overall Cognitive Status: Appears within functional limits for tasks assessed/performed Arousal/Alertness: Awake/alert Orientation Level: Appears intact for tasks assessed Behavior During Session: [facility] for tasks performed    Extremity/Trunk Assessment Right Upper Extremity Assessment RUE ROM/Strength/Tone: Within functional levels RUE Sensation: WFL - Light Touch RUE Coordination: WFL - gross/fine motor Left Upper Extremity Assessment LUE ROM/Strength/Tone: Within functional levels LUE Sensation: WFL - Light Touch LUE Coordination: WFL - gross/fine motor Trunk Assessment Trunk Assessment: Normal     Mobility Bed Mobility Bed Mobility: Supine to Sit;Sitting - Scoot to Edge of Bed;Sit to Supine Supine to Sit: 7: Independent;HOB flat Sitting - Scoot to Edge of Bed: 7: Independent Sit to Supine: 7: Independent Details for Bed Mobility Assistance: Pt required extended time but able to complete without assistnace Transfers Sit to Stand: 7: Independent Stand to Sit: 7: Independent     Exercise     Balance Balance Balance Assessed: Yes Static Sitting Balance Static Sitting - Balance Support: No upper  extremity supported;Feet supported Static Sitting - Level of  Assistance: 7: Independent High Level Balance High Level Balance Activites: Turns;Backward walking;Head turns High Level Balance Comments: Pt completed 2 stairs with Rt Ue on wall, completed turn in less than 4 seconds and located room without cues   End of Session OT - End of Session Activity Tolerance: Patient tolerated treatment well Patient left: in bed;with call bell/phone within reach;with family/visitor present  GO  Pt educated on the need to have family present to demonstrate tub transfer with clothing on first then attempt actual shower. Pt agreeable. Pt needs Rt UE for incr BOS during transfer. PT educated on need for assistance initially.   Harrel Carina [facility] 10/13/2012, 2:43 PM Pager: [phone]

## 2012-10-13 NOTE — Progress Notes (Signed)
Talked to patient about DCP; Independent prior to admission, works full time with the [employer], has private insurance. Patient stated that she had a PCP but he relocated to a different area and she has not found another one; Patient is thinking about [facility] but wants to check to see if they are in her insurance plan. At discharge, patient is agreeable to go to [facility] for follow up medical care until she decides who she wants to see for PCP. Prescriptions are filled at [facility] and she does not have any problems getting her prescriptions filled. Family members present during conversation. Abelino Derrick RN,BSN,MHA

## 2012-10-13 NOTE — Progress Notes (Signed)
[facility] PROGRESS NOTE  Vanessa Campos EAV:[id number] DOB: 1950/10/21 DOA: 10/05/2012 PCP: Per Patient No Pcp  Assessment/Plan:  #1 acute PE/left lower extremity DVT Questionable etiology. Continue full dose Lovenox. Coumadin on hold for FNA today. Once biopsies done  IR to advice when to resume coumadin and lovenox.  #2 thyroid nodule Thyroid ultrasound shows a heterogeneous thyroid nodule. FNA biopsy today per IR. Lovenox on hold for procedure. IR to advice when to resume lovenox and start coumadin post procedure.  #3 lung nodules  Will need followup CT in 12 months.  #4 gastroesophageal reflux disease PPI  #5 fever Likely secondary to problem #1.  Afebrile x 48 hrs. UA negative.  Chest x-ray negative. Follow.  #6 hypotension Likely secondary to volume depletion. Improved with IVF. Follow.Decrease IVF to 75cc/hr  #7 tobacco abuse Tobacco cessation.  Code Status: Full Family Communication: Updated patient and family at bedside. Disposition Plan: Home when medically stable.   Consultants:  None  Procedures:  Thyroid ultrasound 10/07/2012  Lower extremity Dopplers 10/06/2012  CT of the chest 10/05/2012  Chest x-ray 10/05/2012  FNA pending  Antibiotics:  None  HPI/Subjective: Patient states pleuritic right-sided chest pain improving.   Objective: Filed Vitals:   10/12/12 1437 10/12/12 2003 10/12/12 2100 10/13/12 0645  BP: 100/68  126/69 131/61  Pulse: 69  83 67  Temp: 98.6 F (37 C)  98.4 F (36.9 C) 98.8 F (37.1 C)  TempSrc: Oral  Oral Oral  Resp: 18  20 20   Height:      Weight:      SpO2: 96% 97% 96% 100%    Intake/Output Summary (Last 24 hours) at 10/13/12 0816 Last data filed at 10/12/12 2300  Gross per 24 hour  Intake 1657.08 ml  Output    300 ml  Net 1357.08 ml   Filed Weights   10/05/12 1725 10/05/12 1919  Weight: 72.717 kg (160 lb 5 oz) 73.6 kg (162 lb 4.1 oz)    Exam:   General:  NAD  Cardiovascular: RRR. No  lower extremity edema.  Respiratory: CTAB. No wheezing.  Abdomen: Soft, nontender, nondistended, positive bowel sounds.   Data Reviewed: Basic Metabolic Panel:  Recent Labs Lab 10/09/12 0433 10/10/12 0440 10/11/12 0533 10/12/12 0521 10/13/12 0425  NA 137 136 133* 134* 137  K 3.8 3.6 3.8 3.6 3.5  CL 98 99 95* 100 104  CO2 30 29 28 23 22   GLUCOSE 96 89 89 82 80  BUN 8 8 11 11 9   CREATININE 0.73 0.76 0.76 0.72 0.67  CALCIUM 9.2 9.1 9.5 8.7 8.7   Liver Function Tests: No results found for this basename: AST, ALT, ALKPHOS, BILITOT, PROT, ALBUMIN,  in the last 168 hours No results found for this basename: LIPASE, AMYLASE,  in the last 168 hours No results found for this basename: AMMONIA,  in the last 168 hours CBC:  Recent Labs Lab 10/09/12 0433 10/10/12 0440 10/11/12 0533 10/12/12 0521 10/13/12 0425  WBC 8.3 8.7 10.6* 8.7 8.7  HGB 12.6 13.0 13.1 11.8* 11.7*  HCT 37.0 38.5 38.3 34.6* 34.0*  MCV 81.0 81.9 81.0 81.6 81.0  PLT 329 389 436* 446* 478*   Cardiac Enzymes: No results found for this basename: CKTOTAL, CKMB, CKMBINDEX, TROPONINI,  in the last 168 hours BNP (last 3 results) No results found for this basename: PROBNP,  in the last 8760 hours CBG: No results found for this basename: GLUCAP,  in the last 168 hours  Recent Results (from the  past 240 hour(s))  URINE CULTURE     Status: None   Collection Time    10/07/12  7:30 AM      Result Value Range Status   Specimen Description URINE, CLEAN CATCH   Final   Special Requests NONE   Final   Culture  Setup Time 10/07/2012 16:30   Final   Colony Count 65,000 COLONIES/ML   Final   Culture     Final   Value: Multiple bacterial morphotypes present, none predominant. Suggest appropriate recollection if clinically indicated.   Report Status 10/08/2012 FINAL   Final  URINE CULTURE     Status: None   Collection Time    10/10/12  9:30 PM      Result Value Range Status   Specimen Description URINE, CLEAN CATCH    Final   Special Requests NONE   Final   Culture  Setup Time 10/11/2012 05:00   Final   Colony Count 9,000 COLONIES/ML   Final   Culture INSIGNIFICANT GROWTH   Final   Report Status 10/12/2012 FINAL   Final     Studies: No results found.  Scheduled Meds: . docusate sodium  100 mg Oral BID  . enoxaparin (LOVENOX) injection  1 mg/kg Subcutaneous Q12H  . levalbuterol  0.63 mg Nebulization BID  . nicotine  14 mg Transdermal Daily  . pantoprazole  40 mg Oral Daily  . polyethylene glycol  17 g Oral BID  . potassium chloride  40 mEq Oral Once  . sodium chloride  3 mL Intravenous Q12H   Continuous Infusions: . sodium chloride 100 mL/hr at 10/13/12 0101    Principal Problem:   Acute pulmonary embolism Active Problems:   Left leg pain   Tobacco use disorder   GERD (gastroesophageal reflux disease)   Thyroid nodule   Unspecified constipation   Hypotension, unspecified    Time spent: > 30 mins    [facility]  [facility] Pager [phone]. If 7PM-7AM, please contact night-coverage at [email or web address], password [facility] 10/13/2012, 8:16 AM  LOS: 8 days

## 2012-10-13 NOTE — Consult Note (Signed)
  HPI: Vanessa Campos is an 62 y.o. female admitted with PE who is also found to have a left sided thyroid nodule picked up by the Chest CT and confirmed by [place]. Her Coumadin has been held so that biopsy can be obtained. PMHx and meds reviewed.  Past Medical History:  Past Medical History  Diagnosis Date  . Hypertension   . Coronary artery disease     Surgical History:  Past Surgical History  Procedure Laterality Date  . Abdominal surgery    . Carpal tunnel release      Family History: No family history on file.  Social History:  reports that she has been smoking.  She does not have any smokeless tobacco history on file. She reports that she does not drink alcohol or use illicit drugs.  Allergies:  Allergies  Allergen Reactions  . Vicodin (Hydrocodone-Acetaminophen) Other (See Comments)    Shaking     Medications:  Prior to Admission:  No prescriptions prior to admission    ROS: See HPI for pertinent findings, otherwise complete 10 system review negative.  Physical Exam: Blood pressure 131/61, pulse 67, temperature 98.8 F (37.1 C), temperature source Oral, resp. rate 20, height 5\' 3"  (1.6 m), weight 162 lb 4.1 oz (73.6 kg), SpO2 96.00%.  Labs: CBC  Recent Labs  10/12/12 0521 10/13/12 0425  WBC 8.7 8.7  HGB 11.8* 11.7*  HCT 34.6* 34.0*  PLT 446* 478*   MET  Recent Labs  10/12/12 0521 10/13/12 0425  NA 134* 137  K 3.6 3.5  CL 100 104  CO2 23 22  GLUCOSE 82 80  BUN 11 9  CREATININE 0.72 0.67  CALCIUM 8.7 8.7   No results found for this basename: PROT, ALBUMIN, AST, ALT, ALKPHOS, BILITOT, BILIDIR, IBILI, LIPASE,  in the last 72 hours PT/INR  Recent Labs  10/12/12 0521 10/13/12 0425  LABPROT 15.4* 14.6  INR 1.24 1.16   ABG No results found for this basename: PHART, PCO2, PO2, HCO3,  in the last 72 hours    No results found.  Assessment/Plan: Left thyroid nodule For US guided biopsy today. Discussed procedure and risks. COnsent  signed in chart  Brayton El PA-C 10/13/2012, 9:38 AM

## 2012-10-13 NOTE — Progress Notes (Signed)
BENEFIT CHECK FOR LOVENOX- patient has private insurance with [facility]; Independent prior to admission'  pt copay will be $40 for brand;$25 for genric per certified medical assistance. Patient has private insurance with [facility] - patient can follow up with [facility] until she decides where she wants to have as a primary care physician.

## 2012-10-14 DIAGNOSIS — I82409 Acute embolism and thrombosis of unspecified deep veins of unspecified lower extremity: Secondary | ICD-10-CM | POA: Diagnosis present

## 2012-10-14 DIAGNOSIS — I2699 Other pulmonary embolism without acute cor pulmonale: Secondary | ICD-10-CM | POA: Diagnosis present

## 2012-10-14 LAB — BASIC METABOLIC PANEL
CO2: 21 mEq/L (ref 19–32)
Calcium: 8.9 mg/dL (ref 8.4–10.5)
Chloride: 106 mEq/L (ref 96–112)
Glucose, Bld: 90 mg/dL (ref 70–99)
Sodium: 137 mEq/L (ref 135–145)

## 2012-10-14 LAB — CBC
HCT: 34.6 % — ABNORMAL LOW (ref 36.0–46.0)
Hemoglobin: 11.4 g/dL — ABNORMAL LOW (ref 12.0–15.0)
MCH: 26.8 pg (ref 26.0–34.0)
MCV: 81.2 fL (ref 78.0–100.0)
Platelets: 525 10*3/uL — ABNORMAL HIGH (ref 150–400)
RBC: 4.26 MIL/uL (ref 3.87–5.11)

## 2012-10-14 MED ORDER — TRAMADOL HCL 50 MG PO TABS
50.0000 mg | ORAL_TABLET | Freq: Four times a day (QID) | ORAL | Status: DC | PRN
Start: 2012-10-14 — End: 2013-02-02

## 2012-10-14 MED ORDER — POLYETHYLENE GLYCOL 3350 17 G PO PACK: 17.0000 g | PACK | Freq: Two times a day (BID) | ORAL | Status: DC

## 2012-10-14 MED ORDER — PATIENT'S GUIDE TO USING COUMADIN BOOK
Freq: Once | Status: AC
  Administered 2012-10-14: 19:00:00
  Filled 2012-10-14: qty 1

## 2012-10-14 MED ORDER — PANTOPRAZOLE SODIUM 40 MG PO TBEC: 40.0000 mg | DELAYED_RELEASE_TABLET | Freq: Every day | ORAL | Status: DC

## 2012-10-14 MED ORDER — WARFARIN SODIUM 5 MG PO TABS: 5.0000 mg | ORAL_TABLET | Freq: Once | ORAL | Status: DC

## 2012-10-14 MED ORDER — ENOXAPARIN SODIUM 80 MG/0.8ML ~~LOC~~ SOLN: 1.0000 mg/kg | Freq: Two times a day (BID) | SUBCUTANEOUS | Status: DC

## 2012-10-14 MED ORDER — NICOTINE 14 MG/24HR TD PT24: 1.0000 | MEDICATED_PATCH | Freq: Every day | TRANSDERMAL | Status: DC

## 2012-10-14 MED ORDER — BISACODYL 5 MG PO TBEC: 10.0000 mg | DELAYED_RELEASE_TABLET | Freq: Every day | ORAL | Status: DC | PRN

## 2012-10-14 MED ORDER — WARFARIN SODIUM 5 MG PO TABS
5.0000 mg | ORAL_TABLET | Freq: Once | ORAL | Status: AC
  Administered 2012-10-14: 5 mg via ORAL
  Filled 2012-10-14: qty 1

## 2012-10-14 NOTE — Progress Notes (Signed)
Discharge instructions reviewed with pt and her daughter. Pt's daughter instructed on giving lovenox injection again and she did demonstrate the correct way to give injection. Pt also provided handouts and book on coumadin as they had questions about food restrictions. Information reviewed and all questions answered.   Arta Bruce [facility]

## 2012-10-14 NOTE — Progress Notes (Addendum)
ANTICOAGULATION CONSULT NOTE - Follow Up Consult  Pharmacy Consult for Warfarin Indication: acute PE/left lower extremity DVT  Allergies  Allergen Reactions  . Vicodin (Hydrocodone-Acetaminophen) Other (See Comments)    Shaking     Patient Measurements: Height: 5\' 3"  (160 cm) Weight: 162 lb 4.1 oz (73.6 kg) IBW/kg (Calculated) : 52.4  Vital Signs: Temp: 98.7 F (37.1 C) (03/18 0507) Temp src: Oral (03/18 0507) BP: 134/69 mmHg (03/18 0507) Pulse Rate: 84 (03/18 0507)  Labs:  Recent Labs  10/12/12 0521 10/13/12 0425 10/14/12 0459  HGB 11.8* 11.7* 11.4*  HCT 34.6* 34.0* 34.6*  PLT 446* 478* 525*  LABPROT 15.4* 14.6 13.9  INR 1.24 1.16 1.08  CREATININE 0.72 0.67 0.69    Estimated Creatinine Clearance: 71.9 ml/min (by C-G formula based on Cr of 0.69).   Assessment: 61YOF admitted 3/9 with acute PE and LLE DVT. Warfarin (completed 5 days of overlap) held for thyroid biopsy on 3/17 and continued on lovenox monotherapy.  Warfarin (with lovenox) resumed 3/17 pm following biopsy. Hgb and plts stable and no bleeding complications noted.  Goal of Therapy:  INR 2-3 Monitor platelets by anticoagulation protocol: Yes   Plan:  DAY #2/5 mininum overlap (need to start overlap again d/t warfarin being held for procedure)  Based on previous INR trend, give coumadin 5mg  tonight  If goes home today, rec 5mg  daily with INR f/u on Friday  Continue lovenox 75mg  SQ bid as ordered  Daily INR  Monitor for bleeding  CBC minimum of q72h while in hospital  Monitor PRN ketorolac use  [name] 10/14/2012,11:48 AM

## 2012-10-14 NOTE — Progress Notes (Signed)
Talked to patient about home health care services, choices offered, patient chose [facility] for [facility] nursing; Norberta Keens RN with [facility] called for arrangements; patient will be staying with her daughter at discharge -  [place] [address] [place] [address] Tele [phone]

## 2012-10-14 NOTE — Discharge Summary (Signed)
Physician Discharge Summary  Vanessa Campos JWJ:[id number] DOB: 10/22/1950 DOA: 10/05/2012  PCP: Per Patient No Pcp  Admit date: 10/05/2012 Discharge date: 10/14/2012  Time spent: 65 minutes  Recommendations for Outpatient Follow-up:  1. Patient is to followup at the [facility] urgent care center on Friday, 10/17/2012 for PT/INR check and further management of her anticoagulations for a PE and left lower extremity DVT. 2. Patient is to pick a PCP to followup with one week post discharge. On followup a basic metabolic profile and a CBC with need to be checked as well as further management of her anticoagulation for PE and left lower extremity DVT. Followup also will be needed on FNA of the thyroid biopsy which was done. Patient will also require a repeat CT of the chest in 12 months to followup on pulmonary nodules.  Discharge Diagnoses:  Principal Problem:   Pulmonary embolism on right Active Problems:   Acute pulmonary embolism   Tobacco use disorder   GERD (gastroesophageal reflux disease)   Thyroid nodule   Unspecified constipation   Hypotension, unspecified   Leg DVT (deep venous thromboembolism), acute   Discharge Condition: Stable and improved.  Diet recommendation: Regular  Filed Weights   10/05/12 1725 10/05/12 1919  Weight: 72.717 kg (160 lb 5 oz) 73.6 kg (162 lb 4.1 oz)    History of present illness:  Vanessa Campos is a 62 y.o. female smoker with past medical history of hypercholesterolemia, atypical chest pain status post heart cath about 4 years ago-negative per patient report who presents with above complaints. She presents with no complaints of worsening shortness of breath and left leg pain x1 day. She also reports she has had right-sided pleuritic chest pain. 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 She was seen in the ED and  a troponin was done and came back negative, CT angiogram of her chest showed arm segmental/subsegmental right lower lobe pulmonary emboli-small to moderate clot burden with a suspected developing pulmonary infarct in the medial right lung base. 23-4 pulmonary nodules were also noted in the left upper lobe and right lower lobe a 1.6 cm left lower lobe thyroid nodule .  She admits to cough adoptive of blood-streaked sputum. She denies fevers. She is admitted for further evaluation and management.      Hospital Course:  #1 acute PE/left lower extremity DVT  Patient was admitted with complaints of right-sided pleuritic chest pain and worsening shortness of breath and left lower extremity pain x1 day. Patient did stated she drove a snow plow the past 4-5 days prior to admission driving long hours of 12 hour shifts. On arrival in the ED troponin was done which came back negative. CT angiogram of the chest which came back showed right lower lobe pulmonary embolism with small to moderate clot burden with a suspected developing pulmonary infarct in the medial right lung base. It also noted some pulmonary nodules in the left upper lobe and right lower lobe  as well as a 1.6 cm left lower lobe thyroid nodule. Patient was placed on full dose Lovenox and started on Coumadin. Lower extremity Dopplers which were done with consistent with a left gastrocnemius vein DVT. Patient was placed on oxygen and supportive care. Patient's INR became therapeutic however secondary to thyroid nodule noted on CT scan and also noted on thyroid ultrasound patient's INR  had to be reversed for [facility]. Patient was maintained on full dose Lovenox while INR was up therapeutic. Patient subsequently underwent an FNA ultrasound-guided by interventional radiology and her Coumadin was resumed. Patient's pleuritic chest pain improved and patient was satting greater than 92% on room and on ambulation prior to discharge. Patient be discharged on full  dose Lovenox and Coumadin and is to followup at the [facility] urgent care center for PT/INR checked on Friday, 10/17/2012. Patient being her first episode will require at least 6 months of anti-coagulation. Patient is to pick a PCP to followup with. Patient be discharged in stable and improved condition.  On day of discharge patient's INR was 1.08.  #2 thyroid nodule   CT angiogram of the chest which was done on admission noted a thyroid nodule. Thyroid ultrasound was done which showed a heterogeneous thyroid nodule.  Patient INR was reversed and patient subsequently underwent a ultrasound-guided FNA biopsy on 10/13/12 per IR. Lovenox was subsequently resumed as well as her Coumadin. No complications were noted. Patient will be discharged home in stable and improved condition and patient will pick a PCP will need to followup on biopsy results for further evaluation and management.  #3 lung nodules  Will need followup CT in 12 months.  #4 gastroesophageal reflux disease   patient was maintained on a PPI  During the hospitalization would discharge him on Protonix. #5 fever   during hospitalization patient was noted to have a fever which was felt to be likely secondary to problem #1. Urinalysis which was done was negative. Chest x-ray which was done was negative. Patient subsequently defervescenced and was afebrile x72 hours prior to discharge. #6 hypotension   during the hospitalization patient was noted to have an episode of hypotension which was felt to be likely secondary to volume depletion. patient was hydrated with IV fluids with resolution of her hypotension. #7 tobacco abuse  Tobacco cessation. Patient was placed on a nicotine patch.   Procedures:  US guided biopsy of thyroid 10/13/12  Thyroid ultrasound 10/07/2012  Lower extremity Dopplers 10/07/2038  CT angiogram of the chest 10/06/2038  Chest x-ray 10/06/2038  Consultations:  IR   Discharge Exam: Filed Vitals:   10/13/12 2042  10/13/12 2057 10/14/12 0507 10/14/12 0855  BP:  119/74 134/69   Pulse:  73 84   Temp:  99.5 F (37.5 C) 98.7 F (37.1 C)   TempSrc:  Oral Oral   Resp:  22 20   Height:      Weight:      SpO2: 97% 99% 99% 97%    General: NAD Cardiovascular: RRR Respiratory: CTAB  Discharge Instructions      Discharge Orders   Future Orders Complete By Expires     Diet general  As directed     Discharge instructions  As directed     Comments:      Follow up at [facility] Urgent care on Friday 10/17/12 for coumadin check.    Increase activity slowly  As directed         Medication List    TAKE these medications       bisacodyl 5 MG EC tablet  Commonly known as:  DULCOLAX  Take 2 tablets (10 mg total) by mouth daily as needed.     enoxaparin 80 MG/0.8ML injection  Commonly known as:  LOVENOX  Inject 0.75 mLs (75 mg total) into the skin every 12 (twelve) hours.  Start taking on:  10/15/2012     nicotine  14 mg/24hr patch  Commonly known as:  NICODERM CQ - dosed in mg/24 hours  Place 1 patch onto the skin daily.     pantoprazole 40 MG tablet  Commonly known as:  PROTONIX  Take 1 tablet (40 mg total) by mouth daily.     polyethylene glycol packet  Commonly known as:  MIRALAX / GLYCOLAX  Take 17 g by mouth 2 (two) times daily.     traMADol 50 MG tablet  Commonly known as:  ULTRAM  Take 1 tablet (50 mg total) by mouth every 6 (six) hours as needed.     warfarin 5 MG tablet  Commonly known as:  COUMADIN  Take 1 tablet (5 mg total) by mouth one time only at 6 PM.       Follow-up Information   Follow up with [facility] On 10/17/2012. (for PT/INR or coumadin check)    Contact information:   [address] [place] [address] [phone]      Please follow up. (f/u with PCP in 1 week)        The results of significant diagnostics from this hospitalization (including imaging, microbiology, ancillary and laboratory) are listed below for  reference.    Significant Diagnostic Studies: Dg Chest 2 View  10/05/2012  *RADIOLOGY REPORT*  Clinical Data: Shortness of breath, chest pain  CHEST - 2 VIEW  Comparison: 02/07/2010  Findings: Low lung volumes with bibasilar atelectasis.  No focal consolidation.  No pleural effusion or pneumothorax.  The heart is normal in size.  Mild degenerative changes of the visualized thoracolumbar spine.  IMPRESSION: No evidence of acute cardiopulmonary disease.   Original Report Authenticated By: Charline Bills, M.D.    Ct Angio Chest W/cm &/or Wo Cm  10/05/2012  *RADIOLOGY REPORT*  Clinical Data: Chest pain, evaluate for PE  CT ANGIOGRAPHY CHEST  Technique:  Multidetector CT imaging of the chest using the standard protocol during bolus administration of intravenous contrast. Multiplanar reconstructed images including MIPs were obtained and reviewed to evaluate the vascular anatomy.  Contrast: OMNIPAQUE IOHEXOL 350 MG/ML SOLN  Comparison: Chest radiographs dated 10/06/2010  Findings: Segmental/subsegmental pulmonary emboli within branches of the right lower lobe pulmonary artery (series 5/images 127 and 146).  Overall clot burden is small to moderate.  Associated mild ground-glass opacity in the medial right lung base (series 7/image 57), likely reflecting a developing infarct.  Two subpleural 3-4 mm pulmonary nodules in the left upper lobe (series 7/image 31) and right lower lobe (series 7/image 40).  No pleural effusion or pneumothorax.  Visualized thyroid is notable for a 1.6 cm left lower thyroid nodule (series 12/image 5).  The heart is normal in size.  No pericardial effusion.  No suspicious mediastinal, hilar, or axillary lymphadenopathy.  The visualized upper abdomen is notable for two small probable cysts in the central liver (series 5/image 69).  Degenerative changes of the visualized thoracolumbar spine.  IMPRESSION: Segmental/subsegmental right lower lobe pulmonary emboli.  Overall clot burden is  small to moderate.  Suspected developing pulmonary infarct in the medial right lung base.  Two 3-4 mm pulmonary nodules in the left upper lobe and right lower lobe. Assuming high risk for primary bronchogenic neoplasm, a single follow-up CT chest is suggested in 12 months.  1.6 cm left lower thyroid nodule.  Consider thyroid ultrasound further evaluation as clinically warranted.  Above recommendation follows the consensus statement: Guidelines for Management of Small Pulmonary Nodules Detected on CT Scans: A  Statement from the Fleischner Society as published in Radiology 2005; 237:395-400.  Critical Value/emergent results were called by telephone at the time of interpretation on 10/05/2012 at 1730 hours to Dr Preston Fleeting, who verbally acknowledged these results.   Original Report Authenticated By: Charline Bills, M.D.    US Soft Tissue Head/neck  10/06/2012  *RADIOLOGY REPORT*  Clinical Data: Evaluate left lobe thyroid nodule identified on recent CT.  THYROID ULTRASOUND  Technique: Ultrasound examination of the thyroid gland and adjacent soft tissues was performed.  Comparison:  CT chest yesterday.  No prior thyroid imaging.  Findings:  Right thyroid lobe:  Normal in size and echotexture measuring approximately 3.6 x 1.1 x 1.2 cm.  Left thyroid lobe:  Normal in size and echotexture measuring approximately 4.1 x 1.7 x 1.9 cm.  Isthmus:  Normal in thickness measuring 0.2 cm.  Focal nodules: 1.  Heterogeneous solid nodule arising from the lower pole of the left lobe, corresponding to the abnormality on CT, measuring approximately 2.8 x 1.8 x 1.8 cm.  Relatively diminished color Doppler flow within the nodule. 2.  Vague hypoechoic solid nodule in the mid right lobe of the gland measuring approximately 1.0 x 0.7 x 0.7 cm.  Approximate 0.3 cm cyst immediately adjacent to this nodule.  Lymphadenopathy:  None visualized.  IMPRESSION: Heterogeneous approximate 2.8 cm solid nodule arising from the lower pole of the left lobe  of the thyroid gland.  Biopsy of this nodule is recommended.  This recommendation follows the consensus statement:  Management of Thyroid Nodules Detected as [place]:  Society of Radiologists in Ultrasound Consensus Conference Statement. Radiology 2005; [id number].   Original Report Authenticated By: Hulan Saas, M.D.    US Thyroid Biopsy  10/13/2012  *RADIOLOGY REPORT*  Indication: Indeterminate left-sided thyroid nodule  ULTRASOUND GUIDED THYROID FINE NEEDLE ASPIRATION  Comparisons: Thyroid Ultrasound - 10/06/2012; chest CT - 10/05/2012  Intravenous Medications: None  Complications: None immediate  Technique / Findings:  Informed written consent was obtained from the patient after a discussion of the risks, benefits and alternatives to treatment. Questions regarding the procedure were encouraged and answered.  A timeout was performed prior to the initiation of the procedure.  Pre-procedural ultrasound scanning demonstrated grossly unchanged appearance of a mixed echogenic, partially cystic, predominantly solid nodule within the inferior aspect of the left lobe of the thyroid.  The procedure was planned.  The neck was prepped in the usual sterile fashion, and a sterile drape was applied covering the operative field.  A timeout was performed prior to the initiation of the procedure.  Local anesthesia was provided with 1% lidocaine.  Under direct ultrasound guidance, 5 FNA biopsies were performed of nodule with a 25 gauge needle.  The samples were prepared and submitted to pathology.  Limited post procedural scanning was negative for hematoma or additional complication.  A dressing was placed.  The patient tolerated procedure well without immediate postprocedural complication.  Impression:  Technically successful ultrasound guided fine needle aspiration of indeterminate dominant nodule within the inferior aspect of the left lobe of the thyroid.   Original Report Authenticated By: Tacey Ruiz, MD    Dg Chest  Port 1 View  10/10/2012  *RADIOLOGY REPORT*  Clinical Data: Fever. Recent pulmonary emboli.  PORTABLE CHEST - 1 VIEW  Comparison: Chest x-ray dated 10/05/2012  Findings: The patient has new bibasilar atelectasis and tiny effusions.  No consolidative infiltrates.  Heart size and vascularity are normal.  IMPRESSION: New tiny effusions and slight bibasilar atelectasis.  Given  the extent of pulmonary emboli in the right lower lobe, the possibility of pulmonary infarction should be considered.   Original Report Authenticated By: Francene Boyers, M.D.     Microbiology: Recent Results (from the past 240 hour(s))  URINE CULTURE     Status: None   Collection Time    10/07/12  7:30 AM      Result Value Range Status   Specimen Description URINE, CLEAN CATCH   Final   Special Requests NONE   Final   Culture  Setup Time 10/07/2012 16:30   Final   Colony Count 65,000 COLONIES/ML   Final   Culture     Final   Value: Multiple bacterial morphotypes present, none predominant. Suggest appropriate recollection if clinically indicated.   Report Status 10/08/2012 FINAL   Final  URINE CULTURE     Status: None   Collection Time    10/10/12  9:30 PM      Result Value Range Status   Specimen Description URINE, CLEAN CATCH   Final   Special Requests NONE   Final   Culture  Setup Time 10/11/2012 05:00   Final   Colony Count 9,000 COLONIES/ML   Final   Culture INSIGNIFICANT GROWTH   Final   Report Status 10/12/2012 FINAL   Final     Labs: Basic Metabolic Panel:  Recent Labs Lab 10/10/12 0440 10/11/12 0533 10/12/12 0521 10/13/12 0425 10/14/12 0459  NA 136 133* 134* 137 137  K 3.6 3.8 3.6 3.5 3.9  CL 99 95* 100 104 106  CO2 29 28 23 22 21   GLUCOSE 89 89 82 80 90  BUN 8 11 11 9 8   CREATININE 0.76 0.76 0.72 0.67 0.69  CALCIUM 9.1 9.5 8.7 8.7 8.9   Liver Function Tests: No results found for this basename: AST, ALT, ALKPHOS, BILITOT, PROT, ALBUMIN,  in the last 168 hours No results found for this  basename: LIPASE, AMYLASE,  in the last 168 hours No results found for this basename: AMMONIA,  in the last 168 hours CBC:  Recent Labs Lab 10/10/12 0440 10/11/12 0533 10/12/12 0521 10/13/12 0425 10/14/12 0459  WBC 8.7 10.6* 8.7 8.7 8.3  HGB 13.0 13.1 11.8* 11.7* 11.4*  HCT 38.5 38.3 34.6* 34.0* 34.6*  MCV 81.9 81.0 81.6 81.0 81.2  PLT 389 436* 446* 478* 525*   Cardiac Enzymes: No results found for this basename: CKTOTAL, CKMB, CKMBINDEX, TROPONINI,  in the last 168 hours BNP: BNP (last 3 results) No results found for this basename: PROBNP,  in the last 8760 hours CBG: No results found for this basename: GLUCAP,  in the last 168 hours     Signed:  THOMPSON,DANIEL  [facility] 10/14/2012, 1:29 PM

## 2012-10-17 ENCOUNTER — Emergency Department (HOSPITAL_COMMUNITY)
Admission: EM | Admit: 2012-10-17 | Discharge: 2012-10-17 | Disposition: A | Discharge status: Home / Self Care | Payer: 59 | Source: Home / Self Care | Attending: Family Medicine | Admitting: Family Medicine

## 2012-10-17 ENCOUNTER — Encounter (HOSPITAL_COMMUNITY): Payer: Self-pay | Admitting: Emergency Medicine

## 2012-10-17 DIAGNOSIS — Z7901 Long term (current) use of anticoagulants: Secondary | ICD-10-CM

## 2012-10-17 DIAGNOSIS — Z5181 Encounter for therapeutic drug level monitoring: Secondary | ICD-10-CM

## 2012-10-17 LAB — PROTIME-INR: Prothrombin Time: 19.2 seconds — ABNORMAL HIGH (ref 11.6–15.2)

## 2012-10-17 NOTE — ED Notes (Signed)
Patient waiting in lobby for lab results

## 2012-10-17 NOTE — ED Provider Notes (Signed)
History     CSN: [id number]  Arrival date & time 10/17/12  1335   First MD Initiated Contact with Patient 10/17/12 1435      Chief Complaint  Patient presents with  . Labs Only    (Consider location/radiation/quality/duration/timing/severity/associated sxs/prior treatment) Patient is a 62 y.o. female presenting with general illness. The history is provided by the patient.  Illness  Episode onset: d/c'd on 3/18 from hosp for dvt and pe, on coumadin, no more edema, sob or cp.feels good., here for inr. The problem has been rapidly improving. Pertinent negatives include no cough.    Past Medical History  Diagnosis Date  . Hypertension   . Coronary artery disease     Past Surgical History  Procedure Laterality Date  . Abdominal surgery    . Carpal tunnel release      No family history on file.  History  Substance Use Topics  . Smoking status: Current Every Day Smoker -- 1.00 packs/day  . Smokeless tobacco: Not on file  . Alcohol Use: No    OB History   Grav Para Term Preterm Abortions TAB SAB Ect Mult Living                  Review of Systems  Constitutional: Negative.   Respiratory: Negative for cough and shortness of breath.   Cardiovascular: Negative for chest pain.    Allergies  Vicodin  Home Medications   Current Outpatient Rx  Name  Route  Sig  Dispense  Refill  . bisacodyl (DULCOLAX) 5 MG EC tablet   Oral   Take 2 tablets (10 mg total) by mouth daily as needed.   20 tablet   0   . enoxaparin (LOVENOX) 80 MG/0.8ML injection   Subcutaneous   Inject 0.75 mLs (75 mg total) into the skin every 12 (twelve) hours.   12 Syringe   0   . nicotine (NICODERM CQ - DOSED IN MG/24 HOURS) 14 mg/24hr patch   Transdermal   Place 1 patch onto the skin daily.   28 patch   0   . pantoprazole (PROTONIX) 40 MG tablet   Oral   Take 1 tablet (40 mg total) by mouth daily.   30 tablet   0   . polyethylene glycol (MIRALAX / GLYCOLAX) packet   Oral   Take 17  g by mouth 2 (two) times daily.   30 each   0   . traMADol (ULTRAM) 50 MG tablet   Oral   Take 1 tablet (50 mg total) by mouth every 6 (six) hours as needed.   20 tablet   0   . warfarin (COUMADIN) 5 MG tablet   Oral   Take 1 tablet (5 mg total) by mouth one time only at 6 PM.   30 tablet   0     BP 108/66  Pulse 60  Temp(Src) 97.9 F (36.6 C) (Oral)  Resp 12  SpO2 100%  Physical Exam  Nursing note and vitals reviewed. Constitutional: She is oriented to person, place, and time. She appears well-developed and well-nourished.  Cardiovascular: Normal rate, regular rhythm, normal heart sounds and intact distal pulses.   Pulmonary/Chest: Effort normal and breath sounds normal.  Musculoskeletal: She exhibits no edema.  Neurological: She is alert and oriented to person, place, and time.  Skin: Skin is warm and dry.    ED Course  Procedures (including critical care time)  Labs Reviewed  PROTIME-INR - Abnormal; Notable for the following:  Prothrombin Time 19.2 (*)    INR 1.68 (*)    All other components within normal limits   No results found.   1. Anticoagulated on Coumadin       MDM  inr 1.68        Linna Hoff, MD 10/17/12 1659

## 2012-10-17 NOTE — ED Notes (Signed)
Patient at [facility] for lab work.  Follow up from hospitalization : blood clots in left leg and right lung. Initial onset 3/9.

## 2013-01-12 ENCOUNTER — Other Ambulatory Visit: Payer: Self-pay | Admitting: Family Medicine

## 2013-01-12 ENCOUNTER — Ambulatory Visit
Admission: RE | Admit: 2013-01-12 | Discharge: 2013-01-12 | Disposition: A | Discharge status: Home / Self Care | Payer: 59 | Source: Ambulatory Visit | Attending: Family Medicine | Admitting: Family Medicine

## 2013-01-12 DIAGNOSIS — R0602 Shortness of breath: Secondary | ICD-10-CM

## 2013-01-12 MED ORDER — IOHEXOL 350 MG/ML SOLN
125.0000 mL | Freq: Once | INTRAVENOUS | Status: AC | PRN
  Administered 2013-01-12: 125 mL via INTRAVENOUS

## 2013-01-29 ENCOUNTER — Encounter: Payer: Self-pay | Admitting: Pulmonary Disease

## 2013-02-02 ENCOUNTER — Encounter: Payer: Self-pay | Admitting: Pulmonary Disease

## 2013-02-02 ENCOUNTER — Ambulatory Visit (INDEPENDENT_AMBULATORY_CARE_PROVIDER_SITE_OTHER): Payer: 59 | Admitting: Pulmonary Disease

## 2013-02-02 VITALS — BP 116/82 | HR 63 | Temp 98.0°F | Ht 63.0 in | Wt 186.0 lb

## 2013-02-02 DIAGNOSIS — Z23 Encounter for immunization: Secondary | ICD-10-CM

## 2013-02-02 DIAGNOSIS — I82402 Acute embolism and thrombosis of unspecified deep veins of left lower extremity: Secondary | ICD-10-CM

## 2013-02-02 DIAGNOSIS — I82409 Acute embolism and thrombosis of unspecified deep veins of unspecified lower extremity: Secondary | ICD-10-CM

## 2013-02-02 DIAGNOSIS — J449 Chronic obstructive pulmonary disease, unspecified: Secondary | ICD-10-CM | POA: Insufficient documentation

## 2013-02-02 DIAGNOSIS — R0602 Shortness of breath: Secondary | ICD-10-CM

## 2013-02-02 MED ORDER — TIOTROPIUM BROMIDE MONOHYDRATE 18 MCG IN CAPS: 18.0000 ug | ORAL_CAPSULE | Freq: Every day | RESPIRATORY_TRACT | Status: DC

## 2013-02-02 NOTE — Assessment & Plan Note (Signed)
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  Her exam is normal appearing today.  Plan: -we will check a d-dimer and left leg ultrasound in September and hopefully be able to stop the warfarin then (6 months of therapy)

## 2013-02-02 NOTE — Patient Instructions (Signed)
Start using spiriva, one puff daily no matter how you feel  We will set up full pulmonary function testing for you in the next few weeks  We will also set up an echocardiogram in the next few weeks as well  We will schedule a blood test and an ultrasound in two months  We will see you back in 2 months or sooner if needed
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
## 2013-02-02 NOTE — Assessment & Plan Note (Signed)
Though her simple spirometry today was not acceptable by ATS criteria, the flow volume loop was suggestive of obstruction.  Also, she had emphysema on her CT chest.  Based on these findings I think that she has COPD.   We will need to get full PFT's to better characterize and confirm this diagnosis.  She does not need oxygen based on walking in the office today.  Plan: -full PFTs -start Spiriva -Pneumonia shot given today -advised annual flu shot -encouraged exercise daily -f/u 2 months

## 2013-02-03 ENCOUNTER — Telehealth: Payer: Self-pay | Admitting: Pulmonary Disease

## 2013-02-03 MED ORDER — PREDNISONE (PAK) 5 MG PO TABS: ORAL_TABLET | ORAL | Status: DC

## 2013-02-03 NOTE — Telephone Encounter (Signed)
Per SN---  Prednisone dosepak  5 mg  6 day pack Cool compresses to the area Benadryl QID prn.  i have called and spoke with pt and she is aware of SN recs.  Pt to watch the area and to call back if not getting better in the next day or so.

## 2013-02-03 NOTE — Telephone Encounter (Signed)
Called and spoke with pt and she stated that she got a PNA vaccine in our office yesterday by Dr. Kendrick Fries.  She stated today the area is hot to touch, red, swollen and she has used a cool compress to the area without any relief.  Pt is requesting recs on what to do/how to treat this.  SN please advise since BQ is night float today.    Allergies  Allergen Reactions  . Vicodin (Hydrocodone-Acetaminophen) Other (See Comments)    Shaking

## 2013-02-09 ENCOUNTER — Ambulatory Visit (HOSPITAL_COMMUNITY): Payer: 59 | Attending: Cardiovascular Disease | Admitting: Radiology

## 2013-02-09 DIAGNOSIS — R0602 Shortness of breath: Secondary | ICD-10-CM | POA: Insufficient documentation

## 2013-02-09 DIAGNOSIS — E785 Hyperlipidemia, unspecified: Secondary | ICD-10-CM | POA: Insufficient documentation

## 2013-02-09 DIAGNOSIS — I10 Essential (primary) hypertension: Secondary | ICD-10-CM | POA: Insufficient documentation

## 2013-02-09 DIAGNOSIS — E669 Obesity, unspecified: Secondary | ICD-10-CM | POA: Insufficient documentation

## 2013-02-09 NOTE — Progress Notes (Signed)
Echocardiogram performed.  

## 2013-02-10 ENCOUNTER — Encounter: Payer: Self-pay | Admitting: Pulmonary Disease

## 2013-02-11 ENCOUNTER — Telehealth: Payer: Self-pay | Admitting: Pulmonary Disease

## 2013-02-11 NOTE — Telephone Encounter (Signed)
Pt is aware of normal echocardiogram results.

## 2013-02-20 ENCOUNTER — Ambulatory Visit (INDEPENDENT_AMBULATORY_CARE_PROVIDER_SITE_OTHER): Payer: 59 | Admitting: Pulmonary Disease

## 2013-02-20 DIAGNOSIS — J449 Chronic obstructive pulmonary disease, unspecified: Secondary | ICD-10-CM

## 2013-02-20 LAB — PULMONARY FUNCTION TEST

## 2013-02-20 NOTE — Progress Notes (Signed)
PFT done today. 

## 2013-02-24 ENCOUNTER — Encounter: Payer: Self-pay | Admitting: Pulmonary Disease

## 2013-02-24 ENCOUNTER — Telehealth: Payer: Self-pay | Admitting: *Deleted

## 2013-02-24 NOTE — Telephone Encounter (Signed)
Spoke with pt and notified of results per Dr.McQuaid. Pt verbalized understanding and denied any questions. 

## 2013-02-24 NOTE — Telephone Encounter (Signed)
Message copied by Christen Butter on Tue Feb 24, 2013  5:15 PM ------      Message from: Max Fickle B      Created: Tue Feb 24, 2013  2:55 PM       L,            Please let her know that her PFT's looked good.            Thanks,      b ------

## 2013-03-10 ENCOUNTER — Encounter: Payer: Self-pay | Admitting: Pulmonary Disease

## 2013-03-31 ENCOUNTER — Encounter (INDEPENDENT_AMBULATORY_CARE_PROVIDER_SITE_OTHER): Payer: 59

## 2013-03-31 DIAGNOSIS — I803 Phlebitis and thrombophlebitis of lower extremities, unspecified: Secondary | ICD-10-CM

## 2013-03-31 DIAGNOSIS — R0602 Shortness of breath: Secondary | ICD-10-CM

## 2013-04-01 ENCOUNTER — Encounter: Payer: Self-pay | Admitting: Pulmonary Disease

## 2013-04-03 ENCOUNTER — Telehealth: Payer: Self-pay | Admitting: Pulmonary Disease

## 2013-04-03 NOTE — Telephone Encounter (Signed)
I spoke with pt. I advised her do not see where anyone tried calling. It must have been reminder for her appt. Nothing further needed

## 2013-04-06 ENCOUNTER — Ambulatory Visit (INDEPENDENT_AMBULATORY_CARE_PROVIDER_SITE_OTHER): Payer: 59 | Admitting: Pulmonary Disease

## 2013-04-06 ENCOUNTER — Encounter: Payer: Self-pay | Admitting: Pulmonary Disease

## 2013-04-06 VITALS — BP 124/70 | HR 70 | Ht 63.0 in | Wt 188.0 lb

## 2013-04-06 DIAGNOSIS — J449 Chronic obstructive pulmonary disease, unspecified: Secondary | ICD-10-CM

## 2013-04-06 DIAGNOSIS — Z23 Encounter for immunization: Secondary | ICD-10-CM

## 2013-04-06 DIAGNOSIS — I2699 Other pulmonary embolism without acute cor pulmonale: Secondary | ICD-10-CM

## 2013-04-06 MED ORDER — TIOTROPIUM BROMIDE MONOHYDRATE 18 MCG IN CAPS: 18.0000 ug | ORAL_CAPSULE | Freq: Every day | RESPIRATORY_TRACT | Status: DC

## 2013-04-06 MED ORDER — ALBUTEROL SULFATE HFA 108 (90 BASE) MCG/ACT IN AERS: 2.0000 | INHALATION_SPRAY | Freq: Four times a day (QID) | RESPIRATORY_TRACT | Status: DC | PRN

## 2013-04-06 NOTE — Progress Notes (Signed)
  Subjective:    Patient ID: RADONNA BRACHER, female    DOB: November 05, 1950, 62 y.o.   MRN: [id number]  Synopsis: Pleasant female with COPD (FEV1 1.71 L, 85% pred 01/2013) who quit smoking in 09/2012 and had a provoked DVT in 09/2012.  She had a negative LE doppler in 09/2012.  HPI  04/06/2013 > since the last visit [place] has been doing well. Her breathing improved significantly after starting Spiriva. She quit smoking in March and has not started since then. She's not having more leg swelling, chest pain or shortness of breath. 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 She has minimal cough, no chest pain.  Past Medical History  Diagnosis Date  . Hypertension   . Coronary artery disease   . Hyperlipidemia   . DVT (deep venous thrombosis)   . GERD (gastroesophageal reflux disease)   . Asthma      Review of Systems     Objective:   Physical Exam Filed Vitals:   04/06/13 1007  BP: 124/70  Pulse: 70  Height: 5\' 3"  (1.6 m)  Weight: 188 lb (85.276 kg)  SpO2: 97%  RA  Gen: well appearing, no acute distress HEENT: NCAT, EOMi, OP clear,  PULM: Few exp wheezes CV: RRR, no mgr, no JVD AB: BS+, soft, nontender, no hsm Ext: warm, no edema, no clubbing, no cyanosis  03/2013 LE doppler > no DVT     Assessment & Plan:   Acute pulmonary embolism Ms. Horne had her first provoked DVT 6 months ago and now has no more evidence of a DVT on LE doppler.  Plan: -stop warfarin -encouraged to not sit for longer than 3 hours at a time with work, travel, etc  COPD, mild She says that she quit smoking in March which is great. Her pneumonia shot is up to date.  Plan: -continue Spiriva -use albuterol PRN -flu shot today -f/u with [place] in one year   Updated Medication List Outpatient Encounter Prescriptions as of 04/06/2013  Medication Sig Dispense Refill  . bisacodyl (DULCOLAX) 5 MG EC tablet Take 2 tablets (10 mg total) by mouth daily as  needed.  20 tablet  0  . pantoprazole (PROTONIX) 40 MG tablet Take 1 tablet (40 mg total) by mouth daily.  30 tablet  0  . tiotropium (SPIRIVA) 18 MCG inhalation capsule Place 1 capsule (18 mcg total) into inhaler and inhale daily.  30 capsule  5  . warfarin (COUMADIN) 5 MG tablet Take 1 tablet (5 mg total) by mouth one time only at 6 PM.  30 tablet  0  . albuterol (PROVENTIL HFA;VENTOLIN HFA) 108 (90 BASE) MCG/ACT inhaler Inhale 2 puffs into the lungs every 6 (six) hours as needed for wheezing.  1 Inhaler  2  . tiotropium (SPIRIVA HANDIHALER) 18 MCG inhalation capsule Place 1 capsule (18 mcg total) into inhaler and inhale daily.  30 capsule  11  . [DISCONTINUED] predniSONE (STERAPRED UNI-PAK) 5 MG TABS Take as directed.  Please give a 6 day pack  1 each  0   No facility-administered encounter medications on file as of 04/06/2013.

## 2013-04-06 NOTE — Assessment & Plan Note (Signed)
She says that she quit smoking in March which is great. Her pneumonia shot is up to date.  Plan: -continue Spiriva -use albuterol PRN -flu shot today -f/u with [place] in one year

## 2013-04-06 NOTE — Assessment & Plan Note (Signed)
Vanessa Campos had her first provoked DVT 6 months ago and now has no more evidence of a DVT on LE doppler.  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

## 2013-04-06 NOTE — Patient Instructions (Signed)
Stop taking the blood thinner (warfarin)  Keep taking the spiriva once daily no matter how you feel  You can use the albuterol inhaler as needed for shortness of breath  We will see you back in one year

## 2014-01-11 IMAGING — CR DG CHEST 1V PORT
1 series · 1 of 1 positions shown · non-contrast
Comparison: Chest x-ray dated 10/05/2012

CLINICAL DATA: Fever. Recent pulmonary emboli.

PORTABLE CHEST - 1 VIEW

[AP]
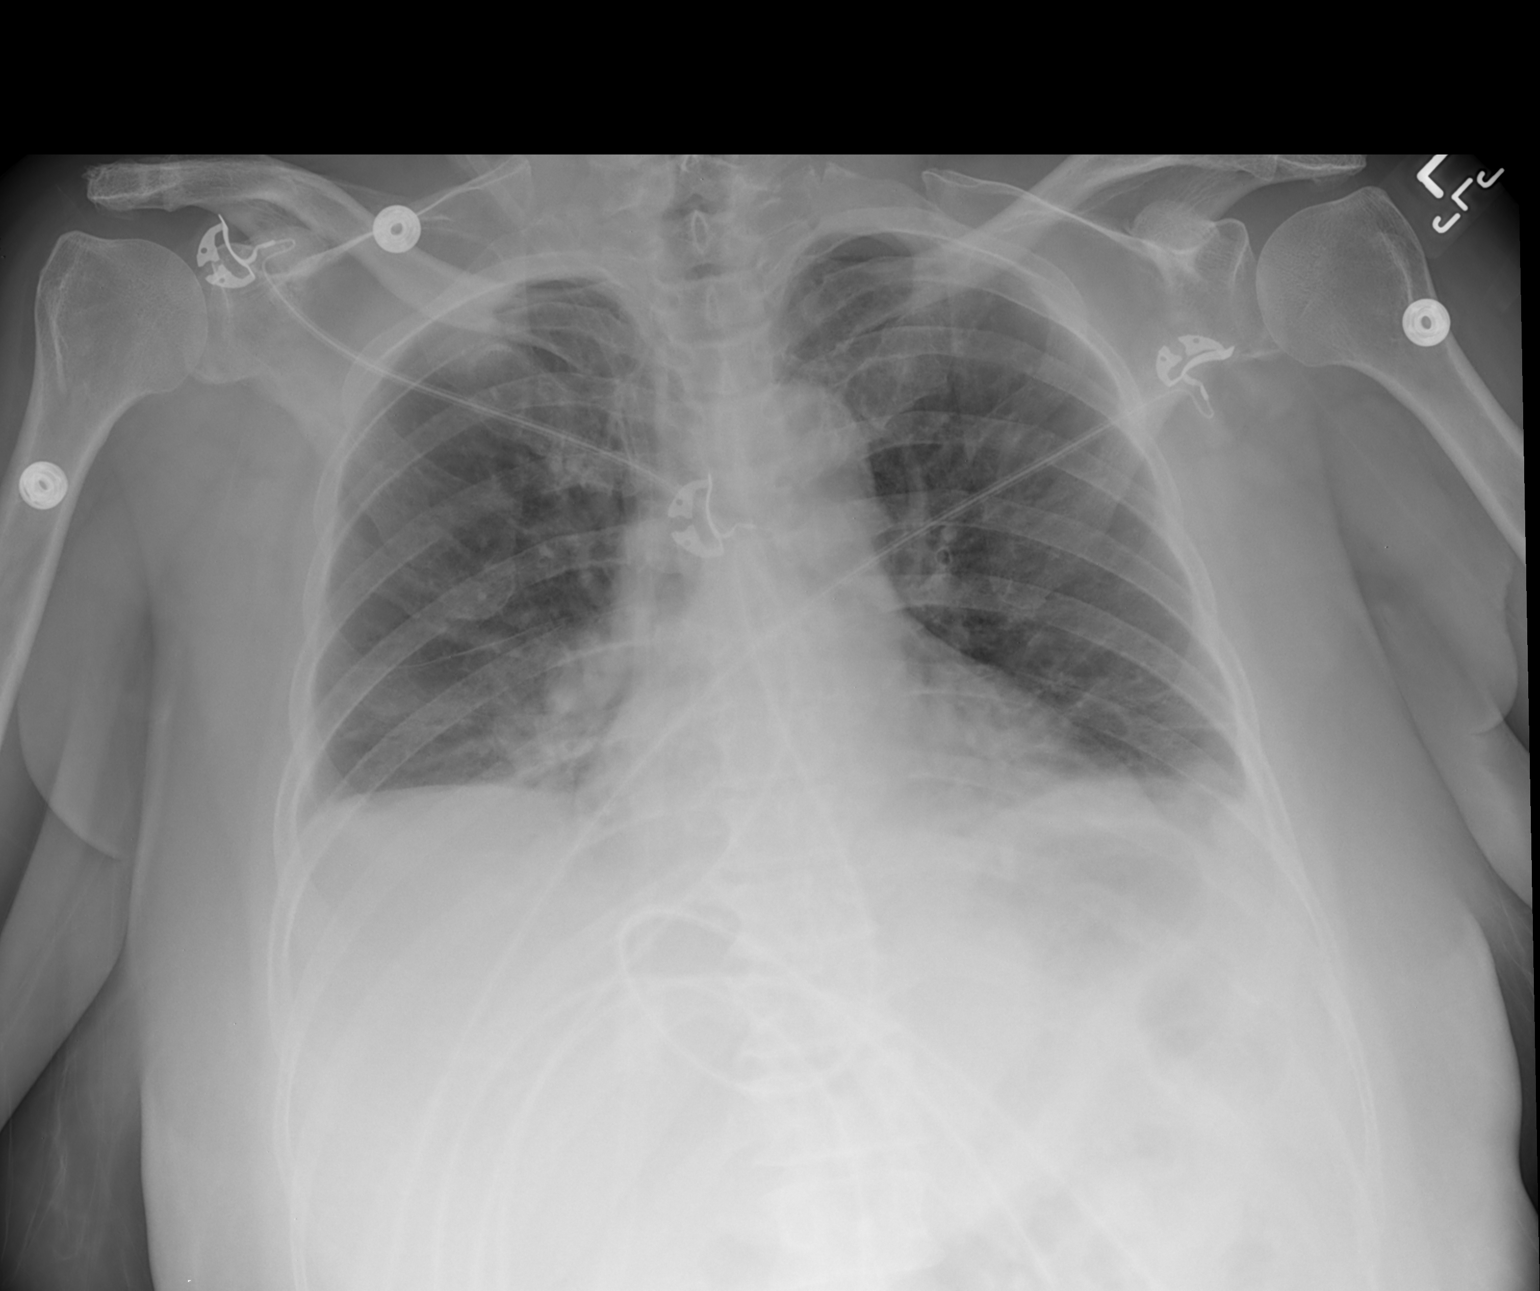

[1 of 1 positions shown; findings below may reference images not displayed]

FINDINGS: The patient has new bibasilar atelectasis and tiny
effusions.  No consolidative infiltrates.  Heart size and
vascularity are normal.
IMPRESSION: New tiny effusions and slight bibasilar atelectasis.  Given the
extent of pulmonary emboli in the right lower lobe, the possibility
of pulmonary infarction should be considered.

## 2014-01-14 IMAGING — US US THYROID BIOPSY
1 series · 13 of 15 positions shown · non-contrast
Comparison: none

INDICATION: Indeterminate left-sided thyroid nodule

[Series 1: us thyroid biopsy · 0.08mm/px · 15 acquisitions, 13 frames shown]
[im 1/15]
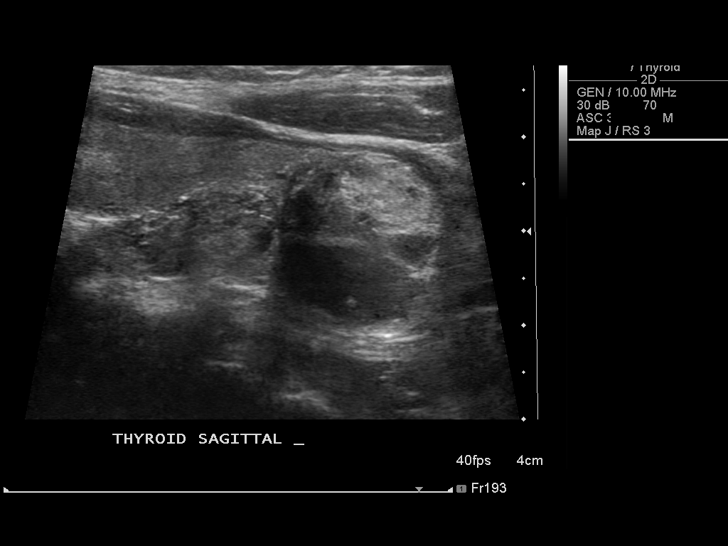
[im 2/15]
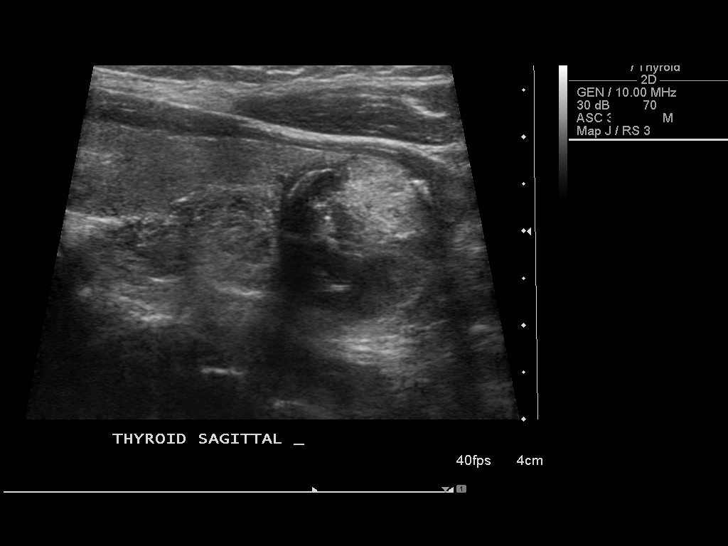
[im 3/15]
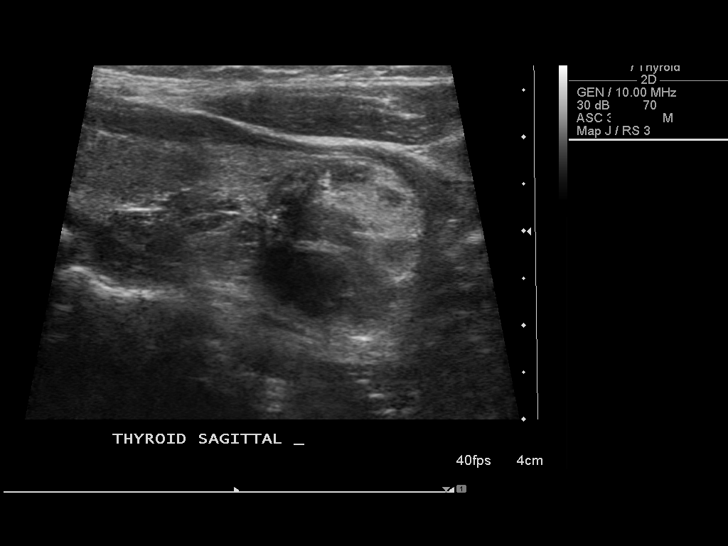
[im 5/15]
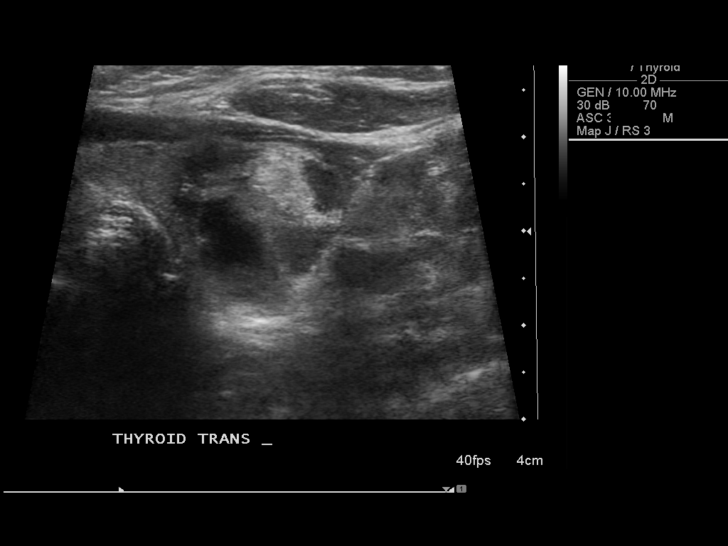
[im 6/15]
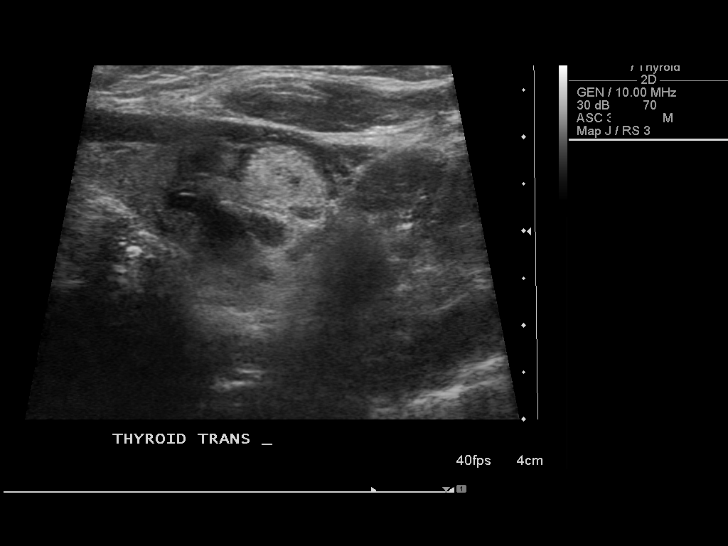
[im 7/15]
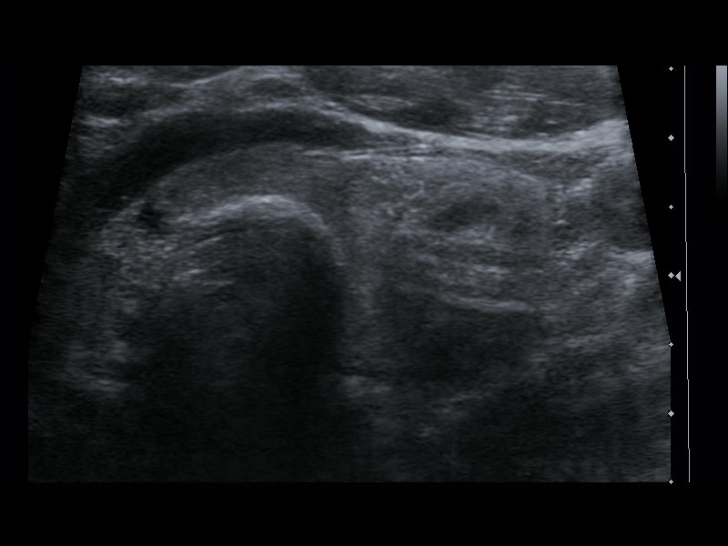
[im 8/15]
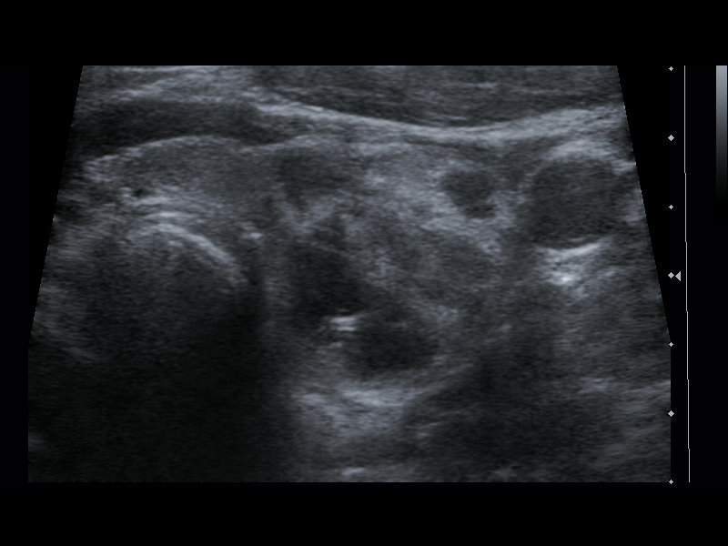
[im 9/15]
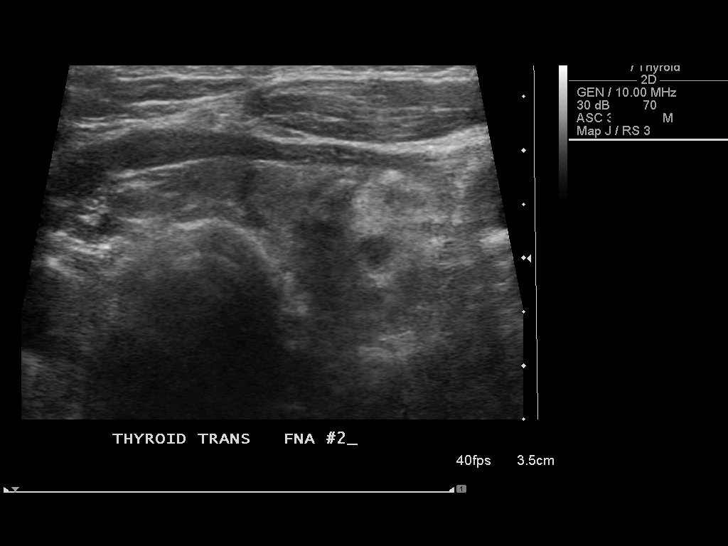
[im 10/15]
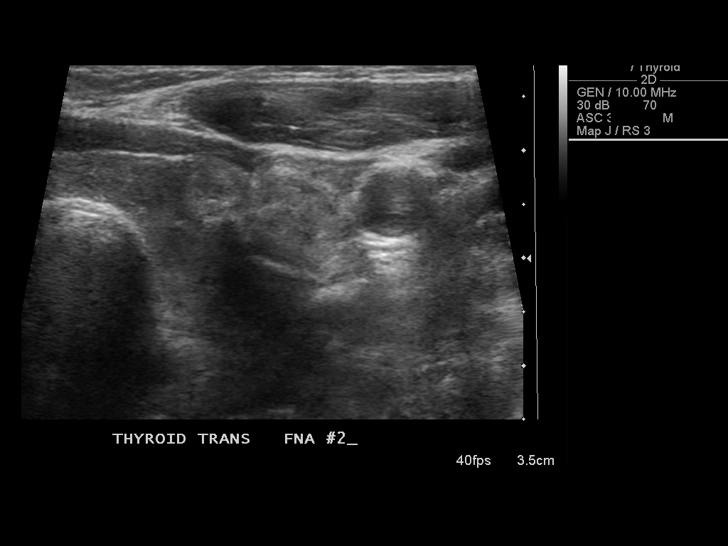
[im 11/15]
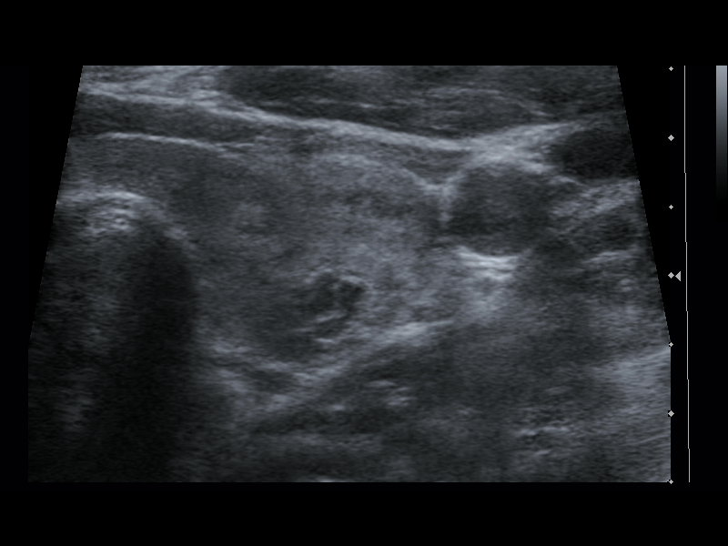
[im 13/15]
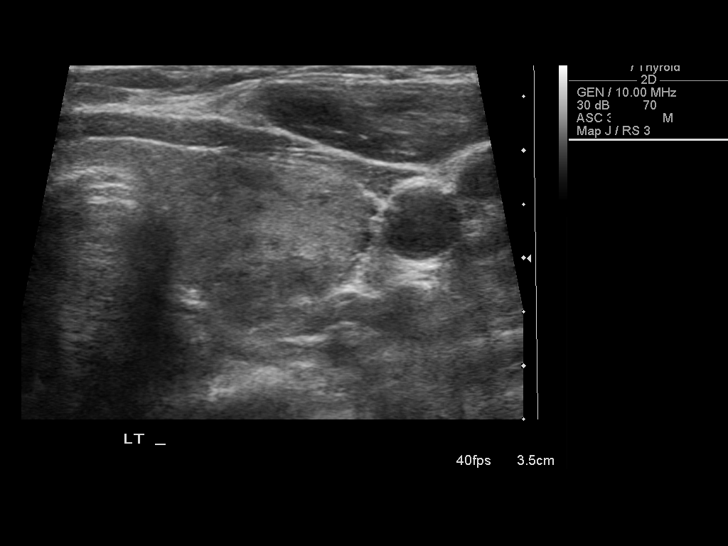
[im 14/15]
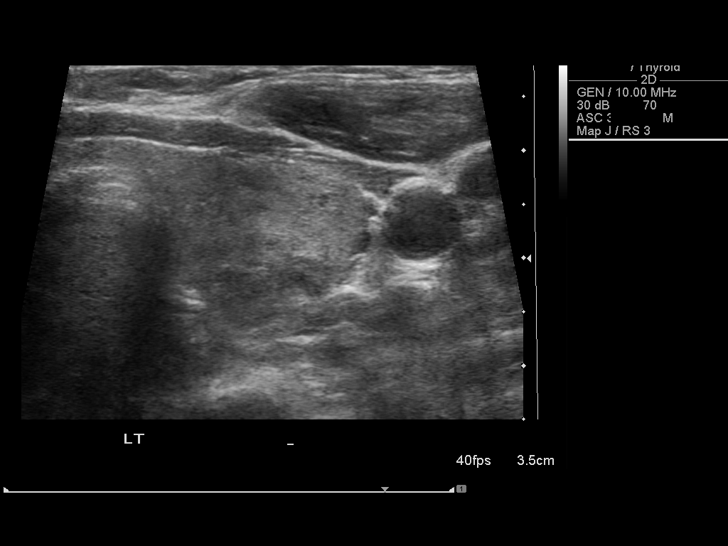
[im 15/15]
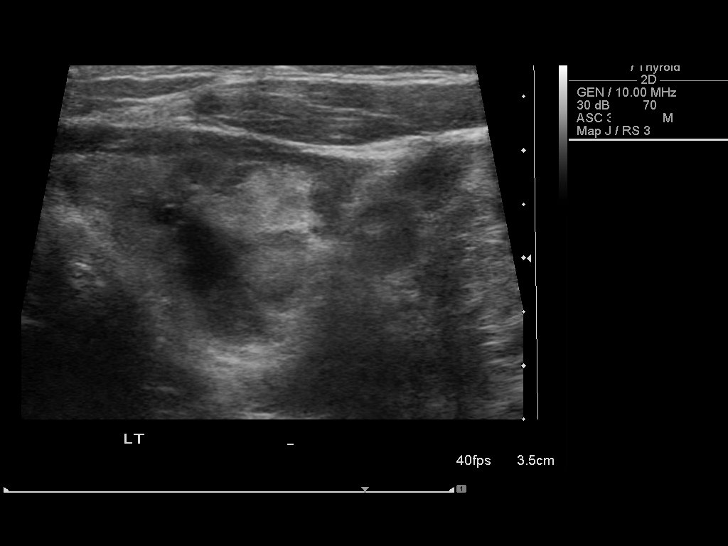

[13 of 15 positions shown; findings below may reference images not displayed]

ULTRASOUND GUIDED THYROID FINE NEEDLE ASPIRATION

Comparisons: Thyroid Ultrasound - 10/06/2012; chest CT - 10/05/2012

Intravenous Medications: None

Complications: None immediate

Technique / Findings:

Informed written consent was obtained from the patient after a
discussion of the risks, benefits and alternatives to treatment.
Questions regarding the procedure were encouraged and answered.  A
timeout was performed prior to the initiation of the procedure.

Pre-procedural ultrasound scanning demonstrated grossly unchanged
appearance of a mixed echogenic, partially cystic, predominantly
solid nodule within the inferior aspect of the left lobe of the
thyroid.  The procedure was planned.  The neck was prepped in the
usual sterile fashion, and a sterile drape was applied covering the
operative field.  A timeout was performed prior to the initiation
of the procedure.  Local anesthesia was provided with 1% lidocaine.

Under direct ultrasound guidance, 5 FNA biopsies were performed of
nodule with a 25 gauge needle.  The samples were prepared and
submitted to pathology.  Limited post procedural scanning was
negative for hematoma or additional complication.  A dressing was
placed.  The patient tolerated procedure well without immediate
postprocedural complication.
IMPRESSION: Technically successful ultrasound guided fine needle aspiration of
indeterminate dominant nodule within the inferior aspect of the
left lobe of the thyroid.

## 2014-01-18 ENCOUNTER — Other Ambulatory Visit (HOSPITAL_COMMUNITY)
Admission: RE | Admit: 2014-01-18 | Discharge: 2014-01-18 | Disposition: A | Discharge status: Home / Self Care | Payer: 59 | Source: Ambulatory Visit | Attending: Family Medicine | Admitting: Family Medicine

## 2014-01-18 ENCOUNTER — Other Ambulatory Visit: Payer: Self-pay | Admitting: Family Medicine

## 2014-01-18 DIAGNOSIS — Z0000 Encounter for general adult medical examination without abnormal findings: Secondary | ICD-10-CM | POA: Insufficient documentation

## 2014-01-19 LAB — CYTOLOGY - PAP

## 2014-01-21 ENCOUNTER — Other Ambulatory Visit: Payer: Self-pay | Admitting: Family Medicine

## 2014-01-21 DIAGNOSIS — R1032 Left lower quadrant pain: Secondary | ICD-10-CM

## 2014-01-26 ENCOUNTER — Other Ambulatory Visit: Payer: 59

## 2014-05-13 ENCOUNTER — Encounter: Payer: Self-pay | Admitting: Pulmonary Disease

## 2014-05-13 ENCOUNTER — Ambulatory Visit (INDEPENDENT_AMBULATORY_CARE_PROVIDER_SITE_OTHER): Payer: 59 | Admitting: Pulmonary Disease

## 2014-05-13 VITALS — BP 124/68 | HR 83 | Ht 63.0 in | Wt 175.0 lb

## 2014-05-13 DIAGNOSIS — Z23 Encounter for immunization: Secondary | ICD-10-CM

## 2014-05-13 DIAGNOSIS — J449 Chronic obstructive pulmonary disease, unspecified: Secondary | ICD-10-CM

## 2014-05-13 MED ORDER — TIOTROPIUM BROMIDE MONOHYDRATE 18 MCG IN CAPS: 18.0000 ug | ORAL_CAPSULE | Freq: Every day | RESPIRATORY_TRACT | Status: DC

## 2014-05-13 NOTE — Patient Instructions (Signed)
Exercise regularly Take your Spiriva daily Stay away from cigarettes We will See you back in a year

## 2014-05-13 NOTE — Assessment & Plan Note (Signed)
She has been stable but I'm worried that she may not be taking care of herself very well right now.  I advised that she start a regular exercise routine, stay away from cigarettes altogether, and use Spiriva daily.  Plan: -renew Spiriva Rx -flu shot -f/u 1 year

## 2014-05-13 NOTE — Progress Notes (Signed)
  Subjective:    Patient ID: Vanessa Campos, female    DOB: 01-16-1951, 63 y.o.   MRN: [id number]  Synopsis: Pleasant female with COPD (FEV1 1.71 L, 85% pred 01/2013) who quit smoking in 09/2012 and had a provoked DVT in 09/2012.  She had a negative LE doppler in 09/2012.  HPI  Chief Complaint  Patient presents with  . Follow-up    Pt c/o occasional nonprod cough Xfew days, no other complaints at this time.    05/13/2014 ROV> Vanessa Campos has been dealing with a lot of stress lately with family health problems and deaths in the family.  She retired in August.  Her breathing is doing OK.  She takes Spiriva about every other day.  She is not exercising regularly.  She has not had a flu shot yet this year.  No cough.  She had 2 cigarettes in the last year when she was stressed, but she has not been smoking regularly.  Past Medical History  Diagnosis Date  . Hypertension   . Coronary artery disease   . Hyperlipidemia   . DVT (deep venous thrombosis)   . GERD (gastroesophageal reflux disease)   . Asthma      Review of Systems      Objective:   Physical Exam  Filed Vitals:   05/13/14 1109  BP: 124/68  Pulse: 83  Height: 5\' 3"  (1.6 m)  Weight: 175 lb (79.379 kg)  SpO2: 99%  RA  Gen: well appearing, no acute distress HEENT: NCAT, EOMi, OP clear,  PULM: CTA B CV: RRR, no mgr, no JVD AB:  soft, nontender,  Ext: warm, no edema, no clubbing, no cyanosis  03/2013 LE doppler > no DVT     Assessment & Plan:   COPD, mild She has been stable but I'm worried that she may not be taking care of herself very well right now.  I advised that she start a regular exercise routine, stay away from cigarettes altogether, and use Spiriva daily.  Plan: -renew Spiriva Rx -flu shot -f/u 1 year    Updated Medication List Outpatient Encounter Prescriptions as of 05/13/2014  Medication Sig  . albuterol (PROVENTIL HFA;VENTOLIN HFA) 108 (90 BASE) MCG/ACT inhaler Inhale 2 puffs into the  lungs every 6 (six) hours as needed for wheezing.  . bisacodyl (DULCOLAX) 5 MG EC tablet Take 2 tablets (10 mg total) by mouth daily as needed.  . pantoprazole (PROTONIX) 40 MG tablet Take 1 tablet (40 mg total) by mouth daily.  Marland Kitchen tiotropium (SPIRIVA HANDIHALER) 18 MCG inhalation capsule Place 1 capsule (18 mcg total) into inhaler and inhale daily.  . [DISCONTINUED] tiotropium (SPIRIVA) 18 MCG inhalation capsule Place 1 capsule (18 mcg total) into inhaler and inhale daily.  . [DISCONTINUED] warfarin (COUMADIN) 5 MG tablet Take 1 tablet (5 mg total) by mouth one time only at 6 PM.

## 2015-03-03 ENCOUNTER — Emergency Department (HOSPITAL_COMMUNITY): Payer: 59

## 2015-03-03 ENCOUNTER — Encounter (HOSPITAL_COMMUNITY): Payer: Self-pay | Admitting: *Deleted

## 2015-03-03 ENCOUNTER — Emergency Department (HOSPITAL_COMMUNITY)
Admission: EM | Admit: 2015-03-03 | Discharge: 2015-03-03 | Disposition: A | Discharge status: Home / Self Care | Payer: 59 | Attending: Emergency Medicine | Admitting: Emergency Medicine

## 2015-03-03 DIAGNOSIS — Y9389 Activity, other specified: Secondary | ICD-10-CM | POA: Diagnosis not present

## 2015-03-03 DIAGNOSIS — Z87891 Personal history of nicotine dependence: Secondary | ICD-10-CM | POA: Diagnosis not present

## 2015-03-03 DIAGNOSIS — I10 Essential (primary) hypertension: Secondary | ICD-10-CM | POA: Diagnosis not present

## 2015-03-03 DIAGNOSIS — K219 Gastro-esophageal reflux disease without esophagitis: Secondary | ICD-10-CM | POA: Diagnosis not present

## 2015-03-03 DIAGNOSIS — Z8639 Personal history of other endocrine, nutritional and metabolic disease: Secondary | ICD-10-CM | POA: Insufficient documentation

## 2015-03-03 DIAGNOSIS — S299XXA Unspecified injury of thorax, initial encounter: Secondary | ICD-10-CM | POA: Diagnosis not present

## 2015-03-03 DIAGNOSIS — Z86718 Personal history of other venous thrombosis and embolism: Secondary | ICD-10-CM | POA: Diagnosis not present

## 2015-03-03 DIAGNOSIS — Y92410 Unspecified street and highway as the place of occurrence of the external cause: Secondary | ICD-10-CM | POA: Insufficient documentation

## 2015-03-03 DIAGNOSIS — Z79899 Other long term (current) drug therapy: Secondary | ICD-10-CM | POA: Diagnosis not present

## 2015-03-03 DIAGNOSIS — J45909 Unspecified asthma, uncomplicated: Secondary | ICD-10-CM | POA: Insufficient documentation

## 2015-03-03 DIAGNOSIS — I2510 Atherosclerotic heart disease of native coronary artery without angina pectoris: Secondary | ICD-10-CM | POA: Diagnosis not present

## 2015-03-03 DIAGNOSIS — Y999 Unspecified external cause status: Secondary | ICD-10-CM | POA: Diagnosis not present

## 2015-03-03 DIAGNOSIS — M542 Cervicalgia: Secondary | ICD-10-CM

## 2015-03-03 DIAGNOSIS — R0789 Other chest pain: Secondary | ICD-10-CM

## 2015-03-03 DIAGNOSIS — S199XXA Unspecified injury of neck, initial encounter: Secondary | ICD-10-CM | POA: Diagnosis not present

## 2015-03-03 DIAGNOSIS — S0990XA Unspecified injury of head, initial encounter: Secondary | ICD-10-CM | POA: Insufficient documentation

## 2015-03-03 MED ORDER — CYCLOBENZAPRINE HCL 10 MG PO TABS
10.0000 mg | ORAL_TABLET | Freq: Once | ORAL | Status: AC
  Administered 2015-03-03: 10 mg via ORAL
  Filled 2015-03-03: qty 1

## 2015-03-03 MED ORDER — ORPHENADRINE CITRATE 30 MG/ML IJ SOLN: 60.0000 mg | Freq: Two times a day (BID) | INTRAMUSCULAR | Status: DC

## 2015-03-03 MED ORDER — KETOROLAC TROMETHAMINE 30 MG/ML IJ SOLN
30.0000 mg | Freq: Once | INTRAMUSCULAR | Status: AC
  Administered 2015-03-03: 30 mg via INTRAVENOUS
  Filled 2015-03-03: qty 1

## 2015-03-03 MED ORDER — IBUPROFEN 600 MG PO TABS: 600.0000 mg | ORAL_TABLET | Freq: Four times a day (QID) | ORAL | Status: DC | PRN

## 2015-03-03 MED ORDER — MORPHINE SULFATE 4 MG/ML IJ SOLN
4.0000 mg | Freq: Once | INTRAMUSCULAR | Status: AC
  Administered 2015-03-03: 4 mg via INTRAVENOUS
  Filled 2015-03-03: qty 1

## 2015-03-03 MED ORDER — CYCLOBENZAPRINE HCL 10 MG PO TABS: 10.0000 mg | ORAL_TABLET | Freq: Two times a day (BID) | ORAL | Status: DC | PRN

## 2015-03-03 NOTE — ED Notes (Signed)
Pt reported burning in her upper arm after this RN pushed saline through the line. This RN did not note any signs of infiltration or swelling.

## 2015-03-03 NOTE — ED Notes (Signed)
Per pharmacy, we do not carry Orphenadrine.
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
## 2015-06-18 ENCOUNTER — Emergency Department (HOSPITAL_BASED_OUTPATIENT_CLINIC_OR_DEPARTMENT_OTHER)
Admission: EM | Admit: 2015-06-18 | Discharge: 2015-06-18 | Disposition: A | Discharge status: Home / Self Care | Payer: 59 | Attending: Emergency Medicine | Admitting: Emergency Medicine

## 2015-06-18 ENCOUNTER — Encounter (HOSPITAL_BASED_OUTPATIENT_CLINIC_OR_DEPARTMENT_OTHER): Payer: Self-pay | Admitting: *Deleted

## 2015-06-18 DIAGNOSIS — H9201 Otalgia, right ear: Secondary | ICD-10-CM | POA: Diagnosis present

## 2015-06-18 DIAGNOSIS — J45909 Unspecified asthma, uncomplicated: Secondary | ICD-10-CM | POA: Diagnosis not present

## 2015-06-18 DIAGNOSIS — Z86718 Personal history of other venous thrombosis and embolism: Secondary | ICD-10-CM | POA: Insufficient documentation

## 2015-06-18 DIAGNOSIS — T161XXA Foreign body in right ear, initial encounter: Secondary | ICD-10-CM | POA: Diagnosis not present

## 2015-06-18 DIAGNOSIS — I10 Essential (primary) hypertension: Secondary | ICD-10-CM | POA: Diagnosis not present

## 2015-06-18 DIAGNOSIS — K219 Gastro-esophageal reflux disease without esophagitis: Secondary | ICD-10-CM | POA: Diagnosis not present

## 2015-06-18 DIAGNOSIS — Z8639 Personal history of other endocrine, nutritional and metabolic disease: Secondary | ICD-10-CM | POA: Insufficient documentation

## 2015-06-18 DIAGNOSIS — Z79899 Other long term (current) drug therapy: Secondary | ICD-10-CM | POA: Diagnosis not present

## 2015-06-18 DIAGNOSIS — Z87891 Personal history of nicotine dependence: Secondary | ICD-10-CM | POA: Insufficient documentation

## 2015-06-18 DIAGNOSIS — I2510 Atherosclerotic heart disease of native coronary artery without angina pectoris: Secondary | ICD-10-CM | POA: Insufficient documentation

## 2015-06-18 DIAGNOSIS — X58XXXA Exposure to other specified factors, initial encounter: Secondary | ICD-10-CM | POA: Diagnosis not present

## 2015-06-18 DIAGNOSIS — Y998 Other external cause status: Secondary | ICD-10-CM | POA: Diagnosis not present

## 2015-06-18 DIAGNOSIS — Y9389 Activity, other specified: Secondary | ICD-10-CM | POA: Insufficient documentation

## 2015-06-18 DIAGNOSIS — Y9289 Other specified places as the place of occurrence of the external cause: Secondary | ICD-10-CM | POA: Diagnosis not present

## 2015-06-18 NOTE — ED Notes (Signed)
States awoke at 0500hrs today with rt ear ache, has tried pouring 2 cups of water in rt ear, used q-tip also. No relief. No throat pain

## 2015-06-18 NOTE — ED Notes (Signed)
Pt states awoke approx 0500hrs this am, had rt ear ache

## 2015-06-18 NOTE — ED Provider Notes (Signed)
CSN: [id number]     Arrival date & time 06/18/15  [id number] History   First MD Initiated Contact with Patient 06/18/15 [phone]     Chief Complaint  Patient presents with  . Otalgia     (Consider location/radiation/quality/duration/timing/severity/associated sxs/prior Treatment) Patient is a 65 y.o. female presenting with ear pain. The history is provided by the patient.  Otalgia Location:  Right Behind ear:  No abnormality Severity:  Moderate Onset quality:  Gradual Duration:  1 day Timing:  Constant Progression:  Unchanged Chronicity:  New Context: not foreign body in ear   Relieved by:  Nothing Worsened by:  Nothing tried Ineffective treatments:  None tried Associated symptoms: cough (1 week)   Associated symptoms: no ear discharge, no fever, no headaches, no tinnitus and no vomiting     Past Medical History  Diagnosis Date  . Hypertension   . Coronary artery disease   . Hyperlipidemia   . GERD (gastroesophageal reflux disease)   . Asthma   . DVT (deep venous thrombosis) ([place]) 2014    provoked, treated with 6 months warfarin   Past Surgical History  Procedure Laterality Date  . Abdominal surgery    . Carpal tunnel release     History reviewed. No pertinent family history. Social History  Substance Use Topics  . Smoking status: Former Smoker -- 1.00 packs/day for 40 years    Types: Cigarettes    Quit date: 10/05/2012  . Smokeless tobacco: Never Used  . Alcohol Use: No   OB History    No data available     Review of Systems  Constitutional: Negative for fever.  HENT: Positive for ear pain. Negative for ear discharge and tinnitus.   Respiratory: Positive for cough (1 week).   Gastrointestinal: Negative for vomiting.  Neurological: Negative for headaches.  All other systems reviewed and are negative.     Allergies  Tramadol and Vicodin  Home Medications   Prior to Admission medications   Medication Sig Start Date End Date Taking? Authorizing Provider   albuterol (PROVENTIL HFA;VENTOLIN HFA) 108 (90 BASE) MCG/ACT inhaler Inhale 2 puffs into the lungs every 6 (six) hours as needed for wheezing. 04/06/13   Juanito Doom, MD  bisacodyl (DULCOLAX) 5 MG EC tablet Take 2 tablets (10 mg total) by mouth daily as needed. 10/14/12   Eugenie Filler, MD  cyclobenzaprine (FLEXERIL) 10 MG tablet Take 1 tablet (10 mg total) by mouth 2 (two) times daily as needed for muscle spasms. 03/03/15   Theodosia Quay, MD  ibuprofen (ADVIL,MOTRIN) 600 MG tablet Take 1 tablet (600 mg total) by mouth every 6 (six) hours as needed for mild pain or moderate pain. 03/03/15   Theodosia Quay, MD  pantoprazole (PROTONIX) 40 MG tablet Take 1 tablet (40 mg total) by mouth daily. 10/14/12   Eugenie Filler, MD  tiotropium (SPIRIVA HANDIHALER) 18 MCG inhalation capsule Place 1 capsule (18 mcg total) into inhaler and inhale daily. 05/13/14 06/19/15  Juanito Doom, MD   BP 134/82 mmHg  Pulse 70  Temp(Src) 98.1 F (36.7 C) (Oral)  Resp 18  Ht 5\' 3"  (1.6 m)  Wt 170 lb (77.111 kg)  BMI 30.12 kg/m2  SpO2 99% Physical Exam  Constitutional: She is oriented to person, place, and time. She appears well-developed and well-nourished. No distress.  HENT:  Head: Normocephalic.  Right Ear: Tympanic membrane normal. A foreign body (deceased insect) is present.  Left Ear: Tympanic membrane normal. No foreign bodies.  Eyes: Conjunctivae are  normal.  Neck: Neck supple. No tracheal deviation present.  Cardiovascular: Normal rate and regular rhythm.   Pulmonary/Chest: Effort normal. No respiratory distress.  Abdominal: Soft. She exhibits no distension.  Neurological: She is alert and oriented to person, place, and time.  Skin: Skin is warm and dry.  Psychiatric: She has a normal mood and affect.    ED Course  .Foreign Body Removal Date/Time: 06/18/2015 9:37 AM Performed by: Leo Grosser Authorized by: Leo Grosser Consent: Verbal consent obtained. Risks and benefits: risks,  benefits and alternatives were discussed Consent given by: patient Patient understanding: patient states understanding of the procedure being performed Required items: required blood products, implants, devices, and special equipment available Patient identity confirmed: verbally with patient, arm band, provided demographic data and hospital-assigned identification number Body area: ear Location details: right ear Patient sedated: no Patient restrained: no Localization method: ENT speculum and visualized Removal mechanism: irrigation Complexity: simple 1 objects recovered. Objects recovered: insect corpse Post-procedure assessment: foreign body removed Patient tolerance: Patient tolerated the procedure well with no immediate complications   (including critical care time) Labs Review Labs Reviewed - No data to display  Imaging Review No results found. I have personally reviewed and evaluated these images and lab results as part of my medical decision-making.   EKG Interpretation None      MDM   Final diagnoses:  Acute foreign body of ear, right, initial encounter    64 year old feel presents with right ear pain that she woke up with this morning. Has otherwise been a symptomatic. Insect is directly visualized overlying tympanic membrane with no evidence of perforation. Irrigated with removal of dead insect without difficulty.    Leo Grosser, MD 06/18/15 [phone]

## 2015-06-18 NOTE — ED Notes (Signed)
Denies any dizziness or vertigo

## 2015-06-18 NOTE — Discharge Instructions (Signed)
Ear Foreign Body °An ear foreign body is an object that is stuck in your ear. The object is usually stuck in the ear canal. °CAUSES °In all ages of people, the most common foreign bodies are insects that enter the ear canal. It is common for young children to put objects into the ear canal. These may include pebbles, beads, parts of toys, and any other small objects that fit into the ear. In adults, objects such as cotton swabs may become lodged in the ear canal.  °SIGNS AND SYMPTOMS °A foreign body in the ear may cause: °· Pain. °· Buzzing or roaring sounds. °· Hearing loss. °· Ear drainage or bleeding. °· Nausea and vomiting. °· A feeling that your ear is full. °DIAGNOSIS °Your health care provider may be able to diagnose an ear foreign body based on the information that you provide, your symptoms, and a physical exam. Your health care provider may also perform tests, such as testing your hearing and your ear pressure, to check for infection or other problems that are caused by the foreign body in your ear. °TREATMENT °Treatment depends on what the foreign body is, the location of the foreign body in your ear, and whether or not the foreign body has injured any part of your inner ear. If the foreign body is visible to your health care provider, it may be possible to remove the foreign body using: °· A tool, such as medical tweezers (forceps) or a suction tube (catheter). °· Irrigation. This uses water to flush the foreign body out of your ear. This is used only if the foreign body is not likely to swell or enlarge when it is put in water. °If the foreign body is not visible or your health care provider was not able to remove the foreign body, you may be referred to a specialist for removal. You may also be prescribed antibiotic medicine or ear drops to prevent infection. If the foreign body has caused injury to other parts of your ear, you may need additional treatment. °HOME CARE INSTRUCTIONS °· Keep all  follow-up visits as directed by your health care provider. This is important. °· Take medicines only as directed by your health care provider. °· If you were prescribed an antibiotic medicine, finish it all even if you start to feel better. °PREVENTION °· Keep small objects out of reach of young children. Tell children not to put anything in their ears. °· Do not put anything in your ear, including cotton swabs, to clean your ears. Talk to your health care provider about how to clean your ears safely. °SEEK MEDICAL CARE IF: °· You have a headache. °· Your have blood coming from your ear. °· You have a fever. °· You have increased pain or swelling of your ear. °· Your hearing is reduced. °· You have discharge coming from your ear. °  °This information is not intended to replace advice given to you by your health care provider. Make sure you discuss any questions you have with your health care provider. °  °Document Released: 07/13/2000 Document Revised: 08/06/2014 Document Reviewed: 03/01/2014 °Elsevier Interactive Patient Education ©2016 [employer] ° °

## 2015-07-15 ENCOUNTER — Ambulatory Visit: Payer: 59 | Admitting: Pulmonary Disease

## 2015-07-18 ENCOUNTER — Encounter: Payer: Self-pay | Admitting: Pulmonary Disease

## 2015-07-18 ENCOUNTER — Ambulatory Visit (INDEPENDENT_AMBULATORY_CARE_PROVIDER_SITE_OTHER): Payer: 59 | Admitting: Pulmonary Disease

## 2015-07-18 VITALS — BP 126/72 | HR 62 | Ht 63.0 in | Wt 164.0 lb

## 2015-07-18 DIAGNOSIS — F17200 Nicotine dependence, unspecified, uncomplicated: Secondary | ICD-10-CM

## 2015-07-18 DIAGNOSIS — J449 Chronic obstructive pulmonary disease, unspecified: Secondary | ICD-10-CM

## 2015-07-18 MED ORDER — ALBUTEROL SULFATE HFA 108 (90 BASE) MCG/ACT IN AERS: 2.0000 | INHALATION_SPRAY | Freq: Four times a day (QID) | RESPIRATORY_TRACT | Status: DC | PRN

## 2015-07-18 NOTE — Assessment & Plan Note (Signed)
She remains asymptomatic but unfortunately she started smoking again.  Plan: Quit smoking As needed albuterol Flu shot today Follow-up one year

## 2015-07-18 NOTE — Patient Instructions (Signed)
Quit smoking Call 1 800-quit-now to get free nicotine replacement from the state of [place] If you have not had success quitting smoking then call me and we can talk about a Chantix prescription Use albuterol as needed I will see you back in one year or sooner if needed

## 2015-07-18 NOTE — Assessment & Plan Note (Signed)
Today we spent a long time talking about her tobacco use today and I explained that this will make her breathing worse if she continues to smoke.  She voiced understanding.  I advised that the best first approach to quitting smoking would be to try to use nicotine replacement. I gave her information for the [place] quit line to help get free nicotine replacement. We also discussed the risks and benefits of using Chantix including psychiatric side effects from his minimal as abnormal dreams to as severe as suicidal thoughts. At this time she would prefer to take nicotine replacement but if she needs Chantix she would be willing to try it.

## 2015-07-18 NOTE — Progress Notes (Signed)
Subjective:    Patient ID: Vanessa Campos, female    DOB: 28-Dec-1950, 64 y.o.   MRN: [id number]  Synopsis: Pleasant female with COPD (FEV1 1.71 L, 85% pred 01/2013) who quit smoking in 09/2012 and had a provoked DVT in 09/2012.  She had a negative LE doppler in 09/2012.  HPI Chief Complaint  Patient presents with  . Follow-up    pt started smoking 4 mos ago, also having to burn wood in house.  pt c/o sometimes prod cough with clear/white mucus. CAT score 9.  pt would like flu shot today.    Vanessa Campos has had a very stressful year because of a lot family deaths. Her breathing has been OK.  She started smoking a little and she is having to burn wood right now because her furnace is out.   She is smoking a pack a day.   She has not had trouble breathing.  No bronchitis or pneumonia this year.   Past Medical History  Diagnosis Date  . Hypertension   . Coronary artery disease   . Hyperlipidemia   . GERD (gastroesophageal reflux disease)   . Asthma   . DVT (deep venous thrombosis) ([place]) 2014    provoked, treated with 6 months warfarin     Review of Systems     Objective:   Physical Exam Filed Vitals:   07/18/15 1024  BP: 126/72  Pulse: 62  Height: 5\' 3"  (1.6 m)  Weight: 164 lb (74.39 kg)  SpO2: 99%  RA  Gen: well appearing, no acute distress HEENT: NCAT, EOMi, OP clear,  PULM: CTA B, normal effort CV: RRR, no mgr, no JVD AB:  soft, nontender,  Ext: warm, no edema, no clubbing, no cyanosis      Assessment & Plan:   Tobacco use disorder Today we spent a long time talking about her tobacco use today and I explained that this will make her breathing worse if she continues to smoke.  She voiced understanding.  I advised that the best first approach to quitting smoking would be to try to use nicotine replacement. I gave her information for the [place] quit line to help get free nicotine replacement. We also discussed the risks and benefits of using Chantix including  psychiatric side effects from his minimal as abnormal dreams to as severe as suicidal thoughts. At this time she would prefer to take nicotine replacement but if she needs Chantix she would be willing to try it.  COPD, mild ([place]) She remains asymptomatic but unfortunately she started smoking again.  Plan: Quit smoking As needed albuterol Flu shot today Follow-up one year    Updated Medication List Outpatient Encounter Prescriptions as of 07/18/2015  Medication Sig  . albuterol (PROVENTIL HFA;VENTOLIN HFA) 108 (90 BASE) MCG/ACT inhaler Inhale 2 puffs into the lungs every 6 (six) hours as needed for wheezing.  . bisacodyl (DULCOLAX) 5 MG EC tablet Take 2 tablets (10 mg total) by mouth daily as needed.  Marland Kitchen ibuprofen (ADVIL,MOTRIN) 600 MG tablet Take 1 tablet (600 mg total) by mouth every 6 (six) hours as needed for mild pain or moderate pain.  . [DISCONTINUED] albuterol (PROVENTIL HFA;VENTOLIN HFA) 108 (90 BASE) MCG/ACT inhaler Inhale 2 puffs into the lungs every 6 (six) hours as needed for wheezing.  . [DISCONTINUED] cyclobenzaprine (FLEXERIL) 10 MG tablet Take 1 tablet (10 mg total) by mouth 2 (two) times daily as needed for muscle spasms. (Patient not taking: Reported on 07/18/2015)  . [DISCONTINUED] pantoprazole (PROTONIX) 40  MG tablet Take 1 tablet (40 mg total) by mouth daily. (Patient not taking: Reported on 07/18/2015)  . [DISCONTINUED] tiotropium (SPIRIVA HANDIHALER) 18 MCG inhalation capsule Place 1 capsule (18 mcg total) into inhaler and inhale daily.   No facility-administered encounter medications on file as of 07/18/2015.

## 2017-07-20 ENCOUNTER — Emergency Department (HOSPITAL_BASED_OUTPATIENT_CLINIC_OR_DEPARTMENT_OTHER): Admission: EM | Admit: 2017-07-20 | Payer: Medicare Other | Source: Home / Self Care

## 2018-08-18 ENCOUNTER — Ambulatory Visit (HOSPITAL_COMMUNITY)
Admission: EM | Admit: 2018-08-18 | Discharge: 2018-08-18 | Disposition: A | Discharge status: Home / Self Care | Payer: Medicare Other | Attending: Family Medicine | Admitting: Family Medicine

## 2018-08-18 ENCOUNTER — Encounter (HOSPITAL_COMMUNITY): Payer: Self-pay | Admitting: Emergency Medicine

## 2018-08-18 DIAGNOSIS — R69 Illness, unspecified: Secondary | ICD-10-CM | POA: Diagnosis not present

## 2018-08-18 DIAGNOSIS — J111 Influenza due to unidentified influenza virus with other respiratory manifestations: Secondary | ICD-10-CM

## 2018-08-18 MED ORDER — OSELTAMIVIR PHOSPHATE 75 MG PO CAPS
75.0000 mg | ORAL_CAPSULE | Freq: Two times a day (BID) | ORAL | 0 refills | Status: AC
Start: 2018-08-18 — End: 2018-08-23

## 2018-08-18 MED ORDER — BENZONATATE 100 MG PO CAPS: 100.0000 mg | ORAL_CAPSULE | Freq: Three times a day (TID) | ORAL | 0 refills | Status: DC

## 2018-08-18 NOTE — ED Triage Notes (Signed)
Pt sts woke this am with body aches and chills with congestion

## 2018-08-18 NOTE — Discharge Instructions (Signed)

## 2018-08-18 NOTE — ED Provider Notes (Signed)
[facility]   [id number] 08/18/18 Arrival Time: 3710  ASSESSMENT & PLAN:  1. Influenza-like illness    See AVS for discharge nstructions.  Meds ordered this encounter  Medications  . oseltamivir (TAMIFLU) 75 MG capsule    Sig: Take 1 capsule (75 mg total) by mouth 2 (two) times daily for 5 days.    Dispense:  10 capsule    Refill:  0  . benzonatate (TESSALON) 100 MG capsule    Sig: Take 1 capsule (100 mg total) by mouth every 8 (eight) hours.    Dispense:  21 capsule    Refill:  0   Discussed typical duration of symptoms. OTC symptom care as needed. Ensure adequate fluid intake and rest. May f/u with PCP or here as needed.  Reviewed expectations re: course of current medical issues. Questions answered. Outlined signs and symptoms indicating need for more acute intervention. Patient verbalized understanding. After Visit Summary given.   SUBJECTIVE: History from: patient.  Vanessa Campos is a 68 y.o. female who presents with complaint of nasal congestion, post-nasal drainage, and a persistent dry cough; without sore throat. Onset abrupt, this morning; with fatigue and with body aches. SOB: none. Wheezing: none. Fever: yes, questions subjective with chills. Overall normal PO intake without n/v. Known sick contacts: no. No specific or significant aggravating or alleviating factors reported. OTC treatment: none.  Received flu shot this year: no.  Social History   Tobacco Use  Smoking Status Current Every Day Smoker  . Packs/day: 1.00  . Years: 40.00  . Pack years: 40.00  . Types: Cigarettes  . Last attempt to quit: 10/05/2012  . Years since quitting: 5.8  Smokeless Tobacco Never Used  Tobacco Comment   started smoking 4 mos ago 07/18/15    ROS: As per HPI.   OBJECTIVE:  Vitals:   08/18/18 1032  BP: 116/77  Pulse: 65  Resp: 18  Temp: 99 F (37.2 C)  TempSrc: Temporal  SpO2: 99%     General appearance: alert; appears fatigued HEENT: nasal  congestion; clear runny nose; throat irritation secondary to post-nasal drainage Neck: supple without LAD CV: RRR Lungs: unlabored respirations, symmetrical air entry without wheezing; cough: mild Abd: soft Ext: no LE edema Skin: warm and dry Psychological: alert and cooperative; normal mood and affect   Allergies  Allergen Reactions  . Tramadol     shaking  . Vicodin [Hydrocodone-Acetaminophen] Other (See Comments)    Shaking     Past Medical History:  Diagnosis Date  . Asthma   . Coronary artery disease   . DVT (deep venous thrombosis) ([place]) 2014   provoked, treated with 6 months warfarin  . GERD (gastroesophageal reflux disease)   . Hyperlipidemia   . Hypertension     Social History   Socioeconomic History  . Marital status: Single    Spouse name: Not on file  . Number of children: Not on file  . Years of education: Not on file  . Highest education level: Not on file  Occupational History  . Not on file  Social Needs  . Financial resource strain: Not on file  . Food insecurity:    Worry: Not on file    Inability: Not on file  . Transportation needs:    Medical: Not on file    Non-medical: Not on file  Tobacco Use  . Smoking status: Current Every Day Smoker    Packs/day: 1.00    Years: 40.00    Pack years: 40.00  Types: Cigarettes    Last attempt to quit: 10/05/2012    Years since quitting: 5.8  . Smokeless tobacco: Never Used  . Tobacco comment: started smoking 4 mos ago 07/18/15  Substance and Sexual Activity  . Alcohol use: No    Alcohol/week: 0.0 standard drinks  . Drug use: No  . Sexual activity: Not on file  Lifestyle  . Physical activity:    Days per week: Not on file    Minutes per session: Not on file  . Stress: Not on file  Relationships  . Social connections:    Talks on phone: Not on file    Gets together: Not on file    Attends religious service: Not on file    Active member of club or organization: Not on file    Attends  meetings of clubs or organizations: Not on file    Relationship status: Not on file  . Intimate partner violence:    Fear of current or ex partner: Not on file    Emotionally abused: Not on file    Physically abused: Not on file    Forced sexual activity: Not on file  Other Topics Concern  . Not on file  Social History Narrative  . Not on file           Vanessa Kick, MD 08/18/18 1216

## 2018-10-19 ENCOUNTER — Emergency Department (HOSPITAL_BASED_OUTPATIENT_CLINIC_OR_DEPARTMENT_OTHER): Payer: Medicare Other

## 2018-10-19 ENCOUNTER — Emergency Department (HOSPITAL_BASED_OUTPATIENT_CLINIC_OR_DEPARTMENT_OTHER)
Admission: EM | Admit: 2018-10-19 | Discharge: 2018-10-19 | Disposition: A | Discharge status: Home / Self Care | Payer: Medicare Other | Attending: Emergency Medicine | Admitting: Emergency Medicine

## 2018-10-19 ENCOUNTER — Encounter (HOSPITAL_BASED_OUTPATIENT_CLINIC_OR_DEPARTMENT_OTHER): Payer: Self-pay | Admitting: *Deleted

## 2018-10-19 ENCOUNTER — Other Ambulatory Visit: Payer: Self-pay

## 2018-10-19 DIAGNOSIS — J449 Chronic obstructive pulmonary disease, unspecified: Secondary | ICD-10-CM | POA: Diagnosis not present

## 2018-10-19 DIAGNOSIS — M25561 Pain in right knee: Secondary | ICD-10-CM | POA: Diagnosis not present

## 2018-10-19 DIAGNOSIS — M79661 Pain in right lower leg: Secondary | ICD-10-CM | POA: Diagnosis not present

## 2018-10-19 DIAGNOSIS — Z79899 Other long term (current) drug therapy: Secondary | ICD-10-CM | POA: Insufficient documentation

## 2018-10-19 DIAGNOSIS — R2241 Localized swelling, mass and lump, right lower limb: Secondary | ICD-10-CM | POA: Diagnosis not present

## 2018-10-19 DIAGNOSIS — M25511 Pain in right shoulder: Secondary | ICD-10-CM | POA: Diagnosis not present

## 2018-10-19 DIAGNOSIS — M7989 Other specified soft tissue disorders: Secondary | ICD-10-CM

## 2018-10-19 DIAGNOSIS — I2510 Atherosclerotic heart disease of native coronary artery without angina pectoris: Secondary | ICD-10-CM | POA: Diagnosis not present

## 2018-10-19 DIAGNOSIS — F17210 Nicotine dependence, cigarettes, uncomplicated: Secondary | ICD-10-CM | POA: Diagnosis not present

## 2018-10-19 DIAGNOSIS — I10 Essential (primary) hypertension: Secondary | ICD-10-CM | POA: Diagnosis not present

## 2018-10-19 HISTORY — DX: Chronic obstructive pulmonary disease, unspecified: J44.9

## 2018-10-19 MED ORDER — DICLOFENAC SODIUM 1 % TD GEL: 2.0000 g | Freq: Four times a day (QID) | TRANSDERMAL | 0 refills | Status: AC | PRN

## 2018-10-19 MED ORDER — ACETAMINOPHEN 325 MG PO TABS
650.0000 mg | ORAL_TABLET | Freq: Once | ORAL | Status: AC
  Administered 2018-10-19: 650 mg via ORAL
  Filled 2018-10-19: qty 2

## 2018-10-19 NOTE — ED Provider Notes (Signed)
[facility] EMERGENCY DEPARTMENT Provider Note   CSN: [id number] Arrival date & time: 10/19/18  1500    History   Chief Complaint Chief Complaint  Patient presents with  . Shoulder Pain  . Knee Pain    HPI Vanessa Campos is a 68 y.o. female.     The history is provided by the patient and medical records. No language interpreter was used.  Shoulder Pain  Knee Pain   Vanessa Campos is a 68 y.o. female  with a PMH of prior provoked DVT no longer on anticoagulants who presents to the Emergency Department with two complaints:  1.  Intermittent right shoulder pain for about a month.  Does not hurt all the time.  Typically feels sore as if it is going to pop out of place when she awakens in the morning.  Gets better throughout the day.  Certain motions will exacerbate the pain, but she cannot specify which motions of these are.  She has not tried any medication for the pain.  Denies any other alleviating factors.  Denies any history of similar.  2.  Right calf pain.  Patient does have history of provoked DVT in the left lower extremity in 2014.  She was on anticoagulation for 6 months which was then discontinued.  She has had no further issues with clotting.  She states that her right calf has been sore for about 3 weeks.  She states that it feels loose when compared to the other calf which feels firm.  Minimally swollen.  Again, no medications prior to arrival for her symptoms.  She denies any chest pain or shortness of breath.  She has a chronic cough due to smoking, but denies any change in cough.  No new sputum changes either.  No fever or chills.  Pain is worse with bending the knee or flexion of the hip, but she is able to do so.  She has been ambulatory without a problem, although this intermittently will cause her some pain.  Denies any inciting event or known injury.   She had an appointment with a new PCP tomorrow, but due to pandemic, PCP cancelled this  appointment.   Past Medical History:  Diagnosis Date  . Asthma   . COPD (chronic obstructive pulmonary disease) ([place])   . Coronary artery disease   . DVT (deep venous thrombosis) ([place]) 2014   provoked, treated with 6 months warfarin  . GERD (gastroesophageal reflux disease)   . Hyperlipidemia   . Hypertension     Patient Active Problem List   Diagnosis Date Noted  . COPD, mild ([place]) 02/02/2013  . Pulmonary embolism on right ([place]) 10/14/2012  . Hypotension, unspecified 10/11/2012  . Thyroid nodule 10/08/2012  . Unspecified constipation 10/08/2012  . Tobacco use disorder 10/05/2012  . GERD (gastroesophageal reflux disease) 10/05/2012    Past Surgical History:  Procedure Laterality Date  . ABDOMINAL SURGERY    . CARPAL TUNNEL RELEASE       OB History   No obstetric history on file.      Home Medications    Prior to Admission medications   Medication Sig Start Date End Date Taking? Authorizing Provider  albuterol (PROVENTIL HFA;VENTOLIN HFA) 108 (90 BASE) MCG/ACT inhaler Inhale 2 puffs into the lungs every 6 (six) hours as needed for wheezing. 07/18/15   Juanito Doom, MD  benzonatate (TESSALON) 100 MG capsule Take 1 capsule (100 mg total) by mouth every 8 (eight) hours. 08/18/18  Vanessa Kick, MD  bisacodyl (DULCOLAX) 5 MG EC tablet Take 2 tablets (10 mg total) by mouth daily as needed. 10/14/12   Eugenie Filler, MD  diclofenac sodium (VOLTAREN) 1 % GEL Apply 2 g topically 4 (four) times daily as needed. 10/19/18   Abbigal Radich, Ozella Almond, PA-C  ibuprofen (ADVIL,MOTRIN) 600 MG tablet Take 1 tablet (600 mg total) by mouth every 6 (six) hours as needed for mild pain or moderate pain. 03/03/15   Theodosia Quay, MD    Family History No family history on file.  Social History Social History   Tobacco Use  . Smoking status: Current Every Day Smoker    Packs/day: 1.00    Years: 40.00    Pack years: 40.00    Types: Cigarettes    Last attempt to quit: 10/05/2012     Years since quitting: 6.0  . Smokeless tobacco: Never Used  . Tobacco comment: started smoking 4 mos ago 07/18/15  Substance Use Topics  . Alcohol use: No    Alcohol/week: 0.0 standard drinks  . Drug use: No     Allergies   Tramadol and Vicodin [hydrocodone-acetaminophen]   Review of Systems Review of Systems  Respiratory: Positive for cough (Chronic, no change). Negative for shortness of breath.   Cardiovascular: Negative for chest pain.  Musculoskeletal: Positive for arthralgias and myalgias.  All other systems reviewed and are negative.    Physical Exam Updated Vital Signs BP (!) 115/91 (BP Location: Right Arm)   Pulse 81   Temp 98 F (36.7 C) (Oral)   Resp 18   Ht 5\' 3"  (1.6 m)   Wt 68 kg   SpO2 100%   BMI 26.57 kg/m   Physical Exam Vitals signs and nursing note reviewed.  Constitutional:      General: She is not in acute distress.    Appearance: She is well-developed.  HENT:     Head: Normocephalic and atraumatic.  Neck:     Musculoskeletal: Neck supple.  Cardiovascular:     Rate and Rhythm: Normal rate and regular rhythm.     Heart sounds: Normal heart sounds. No murmur.  Pulmonary:     Effort: Pulmonary effort is normal. No respiratory distress.     Breath sounds: Normal breath sounds.  Abdominal:     General: There is no distension.     Palpations: Abdomen is soft.     Tenderness: There is no abdominal tenderness.  Musculoskeletal:     Comments: Tenderness to palpation to anterior right shoulder. Full ROM. Negative Neer's. No swelling, erythema or ecchymosis present. No step-off, crepitus, or deformity appreciated. Right calf mildly swollen compared to left. Minimal tenderness. Full ROM of ankle / knee / hip. 5/5 muscle strength of all four extremities. 2+ distal pulses and sensation intact x4. All compartments soft.  Skin:    General: Skin is warm and dry.  Neurological:     Mental Status: She is alert and oriented to person, place, and time.       ED Treatments / Results  Labs (all labs ordered are listed, but only abnormal results are displayed) Labs Reviewed - No data to display  EKG None  Radiology Dg Shoulder Right  Result Date: 10/19/2018 CLINICAL DATA:  Right shoulder pain for 3 weeks.  No known injury. EXAM: RIGHT SHOULDER - 2+ VIEW COMPARISON:  Plain films right shoulder 06/27/2007. FINDINGS: There is no acute bony or joint abnormality. Mild acromioclavicular and glenohumeral degenerative disease noted. Imaged right lung and ribs  appear normal. IMPRESSION: No acute abnormality. Mild glenohumeral and acromioclavicular degenerative change. Electronically Signed   By: Inge Rise M.D.   On: 10/19/2018 15:56   US Venous Img Lower Unilateral Right  Result Date: 10/19/2018 CLINICAL DATA:  Right-sided knee pain for several weeks EXAM: RIGHT LOWER EXTREMITY VENOUS DOPPLER ULTRASOUND TECHNIQUE: Gray-scale sonography with graded compression, as well as color Doppler and duplex ultrasound were performed to evaluate the lower extremity deep venous systems from the level of the common femoral vein and including the common femoral, femoral, profunda femoral, popliteal and calf veins including the posterior tibial, peroneal and gastrocnemius veins when visible. The superficial great saphenous vein was also interrogated. Spectral Doppler was utilized to evaluate flow at rest and with distal augmentation maneuvers in the common femoral, femoral and popliteal veins. COMPARISON:  None. FINDINGS: Contralateral Common Femoral Vein: Respiratory phasicity is normal and symmetric with the symptomatic side. No evidence of thrombus. Normal compressibility. Common Femoral Vein: No evidence of thrombus. Normal compressibility, respiratory phasicity and response to augmentation. Saphenofemoral Junction: No evidence of thrombus. Normal compressibility and flow on color Doppler imaging. Profunda Femoral Vein: No evidence of thrombus. Normal compressibility  and flow on color Doppler imaging. Femoral Vein: No evidence of thrombus. Normal compressibility, respiratory phasicity and response to augmentation. Popliteal Vein: Some decreased flow is noted within the popliteal vein although normal compressibility is seen and no thrombus is noted. Calf Veins: No evidence of thrombus. Normal compressibility and flow on color Doppler imaging. Superficial Great Saphenous Vein: No evidence of thrombus. Normal compressibility. Venous Reflux:  None. Other Findings:  None. IMPRESSION: No evidence of deep venous thrombosis. Mild slow flow is noted within the popliteal vein. Electronically Signed   By: Inez Catalina M.D.   On: 10/19/2018 16:28    Procedures Procedures (including critical care time)  Medications Ordered in ED Medications  acetaminophen (TYLENOL) tablet 650 mg (650 mg Oral Given 10/19/18 1636)     Initial Impression / Assessment and Plan / ED Course  I have reviewed the triage vital signs and the nursing notes.  Pertinent labs & imaging results that were available during my care of the patient were reviewed by me and considered in my medical decision making (see chart for details).       Vanessa Campos is a 69 y.o. female who presents to ED for two complaints:   1. Right shoulder pain for several weeks. Worse in the morning and improves throughout the day. NVI on exam. Full ROM with minimal discomfort. X-ray with no acute findings. Does show mild glenohumeral and acromioclavicular degenerative changes which could be contributory.   2. Right calf pain and swelling for 3 weeks. Hx of provoked DVT in 2014. She was on warfarin for 6 months and has been off anticoagulants without further clotting. NVI on exam with soft compartments. No bony tenderness to the knee. Full ROM of RLE. Ultrasound obtained showing no DVT.    Will treat both symptomatically with RICE as well as tylenol and Voltaren gel. Ortho follow up encouraged.   Patient seen by and  discussed with Dr. Laverta Baltimore who agrees with treatment plan.    Final Clinical Impressions(s) / ED Diagnoses   Final diagnoses:  Leg swelling  Acute pain of right shoulder  Right calf pain    ED Discharge Orders         Ordered    diclofenac sodium (VOLTAREN) 1 % GEL  4 times daily PRN     10/19/18 1638  Gearl Baratta, Ozella Almond, PA-C 10/19/18 1706    Margette Fast, MD 10/19/18 1919

## 2018-10-19 NOTE — ED Triage Notes (Addendum)
Pt reports right shoulder pain and right knee pain x 3 weeks. States she had an appt this week with new PCP but it was canceled on Friday. Denies injury. Denies fever. States she has a chronic cough from smoking. Pt reports hx of blood clot in opposite leg

## 2018-10-19 NOTE — Discharge Instructions (Signed)
It was my pleasure taking care of you today!   Use Voltaren gel for pain as needed.   Call the orthopedic doctor listed in the morning to see when he could see you for follow up.   Return to ER for new or worsening symptoms, any additional concerns.

## 2018-10-22 ENCOUNTER — Ambulatory Visit (INDEPENDENT_AMBULATORY_CARE_PROVIDER_SITE_OTHER): Payer: Medicare Other | Admitting: Sports Medicine

## 2018-10-22 ENCOUNTER — Other Ambulatory Visit: Payer: Self-pay

## 2018-10-22 ENCOUNTER — Encounter: Payer: Self-pay | Admitting: Sports Medicine

## 2018-10-22 DIAGNOSIS — M25561 Pain in right knee: Secondary | ICD-10-CM

## 2018-10-22 MED ORDER — PREDNISONE 10 MG PO TABS: ORAL_TABLET | ORAL | 0 refills | Status: DC

## 2018-10-22 NOTE — Patient Instructions (Signed)
  [address] [place] [address] [phone]  May or June for your first appt

## 2018-10-23 ENCOUNTER — Encounter: Payer: Self-pay | Admitting: Sports Medicine

## 2018-10-23 NOTE — Progress Notes (Signed)
   Subjective:    Patient ID: Vanessa Campos, female    DOB: [birth year], 68 y.o.   MRN: [id number]  HPI chief complaint: Right shoulder and right knee pain  Patient comes in today with a couple of different complaints.  Main complaint is anterior right shoulder pain.  Pain began acutely without any known trauma.  She has noticed swelling in the anterior shoulder.  She denies any problem with the shoulder in the past.  She admits that the swelling is improving but is still quite painful.  No numbness or tingling.  Some limited range of motion secondary to pain. She is also complaining of posterior right knee pain.  She denies any swelling but review of her chart shows that she visited the emergency room a few days ago and a Doppler was performed which was negative for DVT.  Patient does have a remote history of a prior DVT but denies any pain currently in her calf.  She has been using some sort of topical medication but it has not been helpful.  She denies pain elsewhere in the knee.  Most noticeable with standing and walking.  Past medical history reviewed Medications reviewed Allergies reviewed    Review of Systems As above    Objective:   Physical Exam  Well-developed, well-nourished.  No acute distress.  Awake alert and oriented x3.  Vital signs reviewed.  Right shoulder: Full range of motion.  There is a palpable soft tissue mass along the anterior lateral shoulder.  It is slightly tender to palpation.  Nonerythematous.  Is not fluctuant.  Appears to be freely movable.  No tenderness over the acromioclavicular joint.  No tenderness over the bicipital groove.  Rotator cuff strength is 5/5.  Neurovascular intact distally.  Right knee: Full range of motion.  There is no effusion.  No soft tissue swelling.  No palpable Baker's cyst.  She is tender to palpation diffusely through the popliteal fossa.  No tenderness along medial or lateral joint lines.  Negative McMurray's.  Knee is stable  to ligamentous exam. Right calf is supple.  Not tender to palpation.  Negative Homans. Neurovascularly intact distally.  X-ray of the right shoulder done in the emergency room on March 22 show some mild glenohumeral and acromioclavicular degenerative changes but nothing acute.  No obvious soft tissue swelling. Brief bedside ultrasound today of the right shoulder shows the soft tissue mass to appear to be a small lipoma.  I do not appreciate any abscess or cyst.      Assessment & Plan:   Right shoulder pain with possible lipoma Right knee pain possibly secondary to mild DJD versus popliteal muscle strain  Although diagnosis is not quite straightforward for either the right shoulder or the right knee, it seems like her symptoms are improving.  I recommended a 6-day Sterapred Dosepak and an Ace wrap for the right knee.  Follow-up with me again in 1 week for reevaluation.  If right knee pain persists, consider x-rays at that time.  If right shoulder pain persists then consider further diagnostic imaging.  Of note, patient is in the process of trying to find a new primary care physician.  I recommended Dr. Deirdre Peer at [facility] family practice.

## 2018-10-29 ENCOUNTER — Other Ambulatory Visit: Payer: Self-pay

## 2018-10-29 ENCOUNTER — Ambulatory Visit (INDEPENDENT_AMBULATORY_CARE_PROVIDER_SITE_OTHER): Payer: Medicare Other | Admitting: Sports Medicine

## 2018-10-29 VITALS — BP 134/81 | Ht 63.0 in | Wt 150.0 lb

## 2018-10-29 DIAGNOSIS — M25561 Pain in right knee: Secondary | ICD-10-CM

## 2018-10-29 NOTE — Patient Instructions (Signed)
If your knee starts to bother you, you can ice it for 10-15 minutes 2-3 times a day. You can also try some over the counter topical creams: Aspercreme or Capsaicin

## 2018-10-30 NOTE — Progress Notes (Signed)
   Subjective:    Patient ID: Vanessa Campos, female    DOB: 08-05-1950, 68 y.o.   MRN: [id number]  HPI   Vanessa Campos returns today for follow-up on right shoulder pain and right knee pain.  Both have resolved on 6 days of oral steroids.  She still notes a little bit of swelling in the right shoulder but much less pain.   Review of Systems As above    Objective:   Physical Exam  Well-developed, well-nourished.  No acute distress.  Awake alert and oriented x3.  Vital signs reviewed.  Right shoulder: Full range of motion.  No effusion.  The soft tissue mass appreciated on last week's visit is once again appreciated but is not painful to palpation.  It is unchanged from last week's exam.  Full strength.  Good pulses distally.  Right knee: Full range of motion.  No effusion.  No tenderness to palpation, including in the popliteal fossa.  Knee remained stable to ligamentous exam.  No joint line tenderness.  Negative McMurray's.  Neurovascularly intact distally.  Walking without a limp.       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  I think the soft tissue mass is likely a simple lipoma.  If right shoulder pain returns then I may consider further diagnostic imaging in the form of an MRI to better evaluate the soft tissue mass prior to initiating further treatment. In regards to the right knee, her symptoms are likely originating from some mild DJD.  If symptoms return then I would consider x-ray of the right knee and possible intra-articular cortisone injection. I did recommend ice and topical Aspercreme or capsaicin for any returning shoulder pain or knee pain that she may experience in the future. Follow-up as needed.

## 2018-10-31 ENCOUNTER — Emergency Department (HOSPITAL_BASED_OUTPATIENT_CLINIC_OR_DEPARTMENT_OTHER)
Admission: EM | Admit: 2018-10-31 | Discharge: 2018-10-31 | Disposition: A | Discharge status: Home / Self Care | Payer: Medicare Other | Source: Home / Self Care | Attending: Emergency Medicine | Admitting: Emergency Medicine

## 2018-10-31 ENCOUNTER — Emergency Department (HOSPITAL_BASED_OUTPATIENT_CLINIC_OR_DEPARTMENT_OTHER): Payer: Medicare Other

## 2018-10-31 ENCOUNTER — Encounter (HOSPITAL_BASED_OUTPATIENT_CLINIC_OR_DEPARTMENT_OTHER): Payer: Self-pay | Admitting: *Deleted

## 2018-10-31 ENCOUNTER — Other Ambulatory Visit: Payer: Self-pay

## 2018-10-31 DIAGNOSIS — Z86711 Personal history of pulmonary embolism: Secondary | ICD-10-CM

## 2018-10-31 DIAGNOSIS — I214 Non-ST elevation (NSTEMI) myocardial infarction: Principal | ICD-10-CM | POA: Diagnosis present

## 2018-10-31 DIAGNOSIS — Z23 Encounter for immunization: Secondary | ICD-10-CM

## 2018-10-31 DIAGNOSIS — R9431 Abnormal electrocardiogram [ECG] [EKG]: Secondary | ICD-10-CM | POA: Diagnosis not present

## 2018-10-31 DIAGNOSIS — I440 Atrioventricular block, first degree: Secondary | ICD-10-CM | POA: Diagnosis not present

## 2018-10-31 DIAGNOSIS — J449 Chronic obstructive pulmonary disease, unspecified: Secondary | ICD-10-CM

## 2018-10-31 DIAGNOSIS — I2510 Atherosclerotic heart disease of native coronary artery without angina pectoris: Secondary | ICD-10-CM | POA: Insufficient documentation

## 2018-10-31 DIAGNOSIS — R531 Weakness: Secondary | ICD-10-CM | POA: Diagnosis not present

## 2018-10-31 DIAGNOSIS — I10 Essential (primary) hypertension: Secondary | ICD-10-CM | POA: Insufficient documentation

## 2018-10-31 DIAGNOSIS — R079 Chest pain, unspecified: Secondary | ICD-10-CM | POA: Diagnosis not present

## 2018-10-31 DIAGNOSIS — Z86718 Personal history of other venous thrombosis and embolism: Secondary | ICD-10-CM

## 2018-10-31 DIAGNOSIS — E861 Hypovolemia: Secondary | ICD-10-CM | POA: Diagnosis present

## 2018-10-31 DIAGNOSIS — F17210 Nicotine dependence, cigarettes, uncomplicated: Secondary | ICD-10-CM | POA: Diagnosis present

## 2018-10-31 DIAGNOSIS — I959 Hypotension, unspecified: Secondary | ICD-10-CM | POA: Diagnosis present

## 2018-10-31 DIAGNOSIS — R1084 Generalized abdominal pain: Secondary | ICD-10-CM

## 2018-10-31 DIAGNOSIS — M549 Dorsalgia, unspecified: Secondary | ICD-10-CM | POA: Diagnosis not present

## 2018-10-31 DIAGNOSIS — E871 Hypo-osmolality and hyponatremia: Secondary | ICD-10-CM | POA: Diagnosis not present

## 2018-10-31 DIAGNOSIS — K219 Gastro-esophageal reflux disease without esophagitis: Secondary | ICD-10-CM | POA: Diagnosis present

## 2018-10-31 DIAGNOSIS — R0602 Shortness of breath: Secondary | ICD-10-CM | POA: Diagnosis not present

## 2018-10-31 DIAGNOSIS — I2584 Coronary atherosclerosis due to calcified coronary lesion: Secondary | ICD-10-CM | POA: Diagnosis present

## 2018-10-31 DIAGNOSIS — Z79899 Other long term (current) drug therapy: Secondary | ICD-10-CM

## 2018-10-31 DIAGNOSIS — I25118 Atherosclerotic heart disease of native coronary artery with other forms of angina pectoris: Secondary | ICD-10-CM | POA: Diagnosis present

## 2018-10-31 DIAGNOSIS — E785 Hyperlipidemia, unspecified: Secondary | ICD-10-CM | POA: Diagnosis present

## 2018-10-31 DIAGNOSIS — Z20828 Contact with and (suspected) exposure to other viral communicable diseases: Secondary | ICD-10-CM | POA: Diagnosis not present

## 2018-10-31 DIAGNOSIS — R05 Cough: Secondary | ICD-10-CM | POA: Diagnosis not present

## 2018-10-31 DIAGNOSIS — R011 Cardiac murmur, unspecified: Secondary | ICD-10-CM | POA: Diagnosis present

## 2018-10-31 DIAGNOSIS — R109 Unspecified abdominal pain: Secondary | ICD-10-CM | POA: Diagnosis not present

## 2018-10-31 LAB — COMPREHENSIVE METABOLIC PANEL
ALT: 15 U/L (ref 0–44)
AST: 19 U/L (ref 15–41)
Albumin: 3.9 g/dL (ref 3.5–5.0)
Alkaline Phosphatase: 75 U/L (ref 38–126)
Anion gap: 12 (ref 5–15)
BUN: 9 mg/dL (ref 8–23)
CO2: 25 mmol/L (ref 22–32)
Calcium: 9.3 mg/dL (ref 8.9–10.3)
Chloride: 97 mmol/L — ABNORMAL LOW (ref 98–111)
Creatinine, Ser: 0.65 mg/dL (ref 0.44–1.00)
GFR calc Af Amer: >60 mL/min (ref >60)
GFR calc non Af Amer: >60 mL/min (ref >60)
Glucose, Bld: 131 mg/dL — ABNORMAL HIGH (ref 70–99)
Potassium: 3.5 mmol/L (ref 3.5–5.1)
Sodium: 134 mmol/L — ABNORMAL LOW (ref 135–145)
Total Bilirubin: 0.6 mg/dL (ref 0.3–1.2)
Total Protein: 7.4 g/dL (ref 6.5–8.1)

## 2018-10-31 LAB — URINALYSIS, ROUTINE W REFLEX MICROSCOPIC
Bilirubin Urine: NEGATIVE
Glucose, UA: NEGATIVE mg/dL
Ketones, ur: NEGATIVE mg/dL
Leukocytes,Ua: NEGATIVE
Nitrite: NEGATIVE
Protein, ur: NEGATIVE mg/dL
Specific Gravity, Urine: <1.005 — ABNORMAL LOW (ref 1.005–1.030)
pH: 7 (ref 5.0–8.0)

## 2018-10-31 LAB — CBC WITH DIFFERENTIAL/PLATELET
Abs Immature Granulocytes: 0.06 10*3/uL (ref 0.00–0.07)
Basophils Absolute: 0 10*3/uL (ref 0.0–0.1)
Basophils Relative: 0 %
Eosinophils Absolute: 0 10*3/uL (ref 0.0–0.5)
Eosinophils Relative: 0 %
HCT: 48 % — ABNORMAL HIGH (ref 36.0–46.0)
Hemoglobin: 16.2 g/dL — ABNORMAL HIGH (ref 12.0–15.0)
Immature Granulocytes: 0 %
Lymphocytes Relative: 20 %
Lymphs Abs: 2.8 10*3/uL (ref 0.7–4.0)
MCH: 27.6 pg (ref 26.0–34.0)
MCHC: 33.8 g/dL (ref 30.0–36.0)
MCV: 81.9 fL (ref 80.0–100.0)
Monocytes Absolute: 1.1 10*3/uL — ABNORMAL HIGH (ref 0.1–1.0)
Monocytes Relative: 8 %
Neutro Abs: 10 10*3/uL — ABNORMAL HIGH (ref 1.7–7.7)
Neutrophils Relative %: 72 %
Platelets: 450 10*3/uL — ABNORMAL HIGH (ref 150–400)
RBC: 5.86 MIL/uL — ABNORMAL HIGH (ref 3.87–5.11)
RDW: 13.6 % (ref 11.5–15.5)
WBC: 14.5 10*3/uL — ABNORMAL HIGH (ref 4.0–10.5)
nRBC: 0 % (ref 0.0–0.2)

## 2018-10-31 LAB — URINALYSIS, MICROSCOPIC (REFLEX)

## 2018-10-31 LAB — LIPASE, BLOOD: Lipase: 23 U/L (ref 11–51)

## 2018-10-31 MED ORDER — ONDANSETRON HCL 4 MG/2ML IJ SOLN
4.0000 mg | Freq: Once | INTRAMUSCULAR | Status: AC
  Administered 2018-10-31: 4 mg via INTRAVENOUS
  Filled 2018-10-31: qty 2

## 2018-10-31 MED ORDER — SODIUM CHLORIDE 0.9 % IV BOLUS
500.0000 mL | Freq: Once | INTRAVENOUS | Status: AC
  Administered 2018-10-31: 02:00:00 500 mL via INTRAVENOUS

## 2018-10-31 MED ORDER — DICYCLOMINE HCL 20 MG PO TABS: 20.0000 mg | ORAL_TABLET | Freq: Three times a day (TID) | ORAL | 0 refills | Status: DC

## 2018-10-31 MED ORDER — IOHEXOL 300 MG/ML  SOLN
100.0000 mL | Freq: Once | INTRAMUSCULAR | Status: AC | PRN
  Administered 2018-10-31: 03:00:00 100 mL via INTRAVENOUS

## 2018-10-31 MED ORDER — MORPHINE SULFATE (PF) 4 MG/ML IV SOLN
4.0000 mg | Freq: Once | INTRAVENOUS | Status: AC
  Administered 2018-10-31: 02:00:00 4 mg via INTRAVENOUS
  Filled 2018-10-31: qty 1

## 2018-10-31 MED ORDER — ONDANSETRON HCL 4 MG PO TABS: 4.0000 mg | ORAL_TABLET | Freq: Four times a day (QID) | ORAL | 0 refills | Status: DC

## 2018-10-31 MED ORDER — LOPERAMIDE HCL 2 MG PO CAPS: 2.0000 mg | ORAL_CAPSULE | Freq: Four times a day (QID) | ORAL | 0 refills | Status: DC | PRN

## 2018-10-31 MED ORDER — IOPAMIDOL (ISOVUE-300) INJECTION 61%
30.0000 mL | Freq: Once | INTRAVENOUS | Status: AC | PRN
  Administered 2018-10-31: 15 mL via ORAL

## 2018-10-31 NOTE — ED Provider Notes (Signed)
[facility] EMERGENCY DEPARTMENT Provider Note   CSN: [id number] Arrival date & time: 10/31/18  0051    History   Chief Complaint Chief Complaint  Patient presents with   Abdominal Pain    HPI Vanessa Campos is a 68 y.o. female.     Patient presents to the emergency department for evaluation of abdominal pain.  Patient reports that she has been experiencing diffuse abdominal pain for 2 days.  She has had mild episodes of nausea, vomiting and diarrhea.  She has not had any fever.  Patient does report some cough and chest congestion as well.     Past Medical History:  Diagnosis Date   Asthma    COPD (chronic obstructive pulmonary disease) ([place])    Coronary artery disease    DVT (deep venous thrombosis) ([place]) 2014   provoked, treated with 6 months warfarin   GERD (gastroesophageal reflux disease)    Hyperlipidemia    Hypertension     Patient Active Problem List   Diagnosis Date Noted   COPD, mild ([place]) 02/02/2013   Pulmonary embolism on right ([place]) 10/14/2012   Hypotension, unspecified 10/11/2012   Thyroid nodule 10/08/2012   Unspecified constipation 10/08/2012   Tobacco use disorder 10/05/2012   GERD (gastroesophageal reflux disease) 10/05/2012    Past Surgical History:  Procedure Laterality Date   ABDOMINAL SURGERY     CARPAL TUNNEL RELEASE       OB History   No obstetric history on file.      Home Medications    Prior to Admission medications   Medication Sig Start Date End Date Taking? Authorizing Provider  albuterol (PROVENTIL HFA;VENTOLIN HFA) 108 (90 BASE) MCG/ACT inhaler Inhale 2 puffs into the lungs every 6 (six) hours as needed for wheezing. Patient not taking: Reported on 10/29/2018 07/18/15   Juanito Doom, MD  diclofenac sodium (VOLTAREN) 1 % GEL Apply 2 g topically 4 (four) times daily as needed. Patient not taking: Reported on 10/29/2018 10/19/18   Ward, Ozella Almond, PA-C  dicyclomine (BENTYL) 20 MG tablet  Take 1 tablet (20 mg total) by mouth 3 (three) times daily before meals. 10/31/18   Orpah Greek, MD  loperamide (IMODIUM) 2 MG capsule Take 1 capsule (2 mg total) by mouth 4 (four) times daily as needed for diarrhea or loose stools. 10/31/18   Orpah Greek, MD  ondansetron (ZOFRAN) 4 MG tablet Take 1 tablet (4 mg total) by mouth every 6 (six) hours. 10/31/18   Orpah Greek, MD    Family History History reviewed. No pertinent family history.  Social History Social History   Tobacco Use   Smoking status: Current Every Day Smoker    Packs/day: 1.00    Years: 40.00    Pack years: 40.00    Types: Cigarettes    Last attempt to quit: 10/05/2012    Years since quitting: 6.0   Smokeless tobacco: Never Used   Tobacco comment: started smoking 4 mos ago 07/18/15  Substance Use Topics   Alcohol use: No    Alcohol/week: 0.0 standard drinks   Drug use: No     Allergies   Tramadol and Vicodin [hydrocodone-acetaminophen]   Review of Systems Review of Systems  Respiratory: Positive for cough.   Gastrointestinal: Positive for abdominal pain, diarrhea, nausea and vomiting.  All other systems reviewed and are negative.    Physical Exam Updated Vital Signs BP (!) 141/67 (BP Location: Right Arm)    Pulse 62  Temp (!) 97.5 F (36.4 C)    Resp 18    Ht 5\' 3"  (1.6 m)    Wt 68 kg    SpO2 95%    BMI 26.57 kg/m   Physical Exam Vitals signs and nursing note reviewed.  Constitutional:      General: She is not in acute distress.    Appearance: Normal appearance. She is well-developed.  HENT:     Head: Normocephalic and atraumatic.     Right Ear: Hearing normal.     Left Ear: Hearing normal.     Nose: Nose normal.  Eyes:     Conjunctiva/sclera: Conjunctivae normal.     Pupils: Pupils are equal, round, and reactive to light.  Neck:     Musculoskeletal: Normal range of motion and neck supple.  Cardiovascular:     Rate and Rhythm: Regular rhythm.     Heart  sounds: S1 normal and S2 normal. No murmur. No friction rub. No gallop.   Pulmonary:     Effort: Pulmonary effort is normal. No respiratory distress.     Breath sounds: Normal breath sounds.  Chest:     Chest wall: No tenderness.  Abdominal:     General: Bowel sounds are normal.     Palpations: Abdomen is soft.     Tenderness: There is generalized abdominal tenderness. There is no guarding or rebound. Negative signs include Murphy's sign and McBurney's sign.     Hernia: No hernia is present.  Musculoskeletal: Normal range of motion.  Skin:    General: Skin is warm and dry.     Findings: No rash.  Neurological:     Mental Status: She is alert and oriented to person, place, and time.     GCS: GCS eye subscore is 4. GCS verbal subscore is 5. GCS motor subscore is 6.     Cranial Nerves: No cranial nerve deficit.     Sensory: No sensory deficit.     Coordination: Coordination normal.  Psychiatric:        Speech: Speech normal.        Behavior: Behavior normal.        Thought Content: Thought content normal.      ED Treatments / Results  Labs (all labs ordered are listed, but only abnormal results are displayed) Labs Reviewed  CBC WITH DIFFERENTIAL/PLATELET - Abnormal; Notable for the following components:      Result Value   WBC 14.5 (*)    RBC 5.86 (*)    Hemoglobin 16.2 (*)    HCT 48.0 (*)    Platelets 450 (*)    Neutro Abs 10.0 (*)    Monocytes Absolute 1.1 (*)    All other components within normal limits  COMPREHENSIVE METABOLIC PANEL - Abnormal; Notable for the following components:   Sodium 134 (*)    Chloride 97 (*)    Glucose, Bld 131 (*)    All other components within normal limits  URINALYSIS, ROUTINE W REFLEX MICROSCOPIC - Abnormal; Notable for the following components:   Specific Gravity, Urine <1.005 (*)    Hgb urine dipstick TRACE (*)    All other components within normal limits  URINALYSIS, MICROSCOPIC (REFLEX) - Abnormal; Notable for the following  components:   Bacteria, UA RARE (*)    All other components within normal limits  LIPASE, BLOOD    EKG EKG Interpretation  Date/Time:  Friday October 31 2018 01:41:51 EDT Ventricular Rate:  67 PR Interval:    QRS Duration:  90 QT Interval:  429 QTC Calculation: 453 R Axis:   19 Text Interpretation:  Sinus rhythm LAE, consider biatrial enlargement RSR' in V1 or V2, right VCD or RVH No significant change since last tracing Confirmed by Orpah Greek [phone]) on 10/31/2018 1:43:32 AM   Radiology Dg Chest 2 View  Result Date: 10/31/2018 CLINICAL DATA:  Cough. Abdominal pain. History of COPD, hypertension and hyperlipidemia. EXAM: CHEST - 2 VIEW COMPARISON:  Chest radiograph March 03, 2015 FINDINGS: Cardiomediastinal silhouette is normal. No pleural effusions or focal consolidations. Trachea projects midline and there is no pneumothorax. Soft tissue planes and included osseous structures are non-suspicious. IMPRESSION: No active cardiopulmonary process. Electronically Signed   By: Elon Alas M.D.   On: 10/31/2018 03:41   Ct Abdomen Pelvis W Contrast  Result Date: 10/31/2018 CLINICAL DATA:  Mid abdominal pain for 2 days. History of abdominal surgery. EXAM: CT ABDOMEN AND PELVIS WITH CONTRAST TECHNIQUE: Multidetector CT imaging of the abdomen and pelvis was performed using the standard protocol following bolus administration of intravenous contrast. CONTRAST:  75mL ISOVUE-300 IOPAMIDOL (ISOVUE-300) INJECTION 61%, 164mL OMNIPAQUE IOHEXOL 300 MG/ML SOLN COMPARISON:  CT abdomen and pelvis January 16, 2010 FINDINGS: LOWER CHEST: RIGHT lung base scarring. Included heart size is normal. No pericardial effusion. HEPATOBILIARY: 14 mm homogeneously hypodense benign-appearing cyst LEFT lobe of the liver with a few smaller scattered hepatic cysts. Normal gallbladder. PANCREAS: Normal. SPLEEN: Normal. ADRENALS/URINARY TRACT: Kidneys are orthotopic, demonstrating symmetric enhancement. No nephrolithiasis,  hydronephrosis or solid renal masses. 3.6 cm homogeneously hypodense benign-appearing cyst interpolar LEFT kidney. Seventeen and 19 mm benign-appearing RIGHT interpolar cyst. Additional too small to characterize hypodensities bilateral kidneys. The unopacified ureters are normal in course and caliber. Delayed imaging through the kidneys demonstrates symmetric prompt contrast excretion within the proximal urinary collecting system. Urinary bladder is decompressed and unremarkable. Normal adrenal glands. STOMACH/BOWEL: The stomach, small and large bowel are normal in course and caliber without inflammatory changes. Normal appendix. VASCULAR/LYMPHATIC: Aortoiliac vessels are normal in course and caliber. Moderate calcific atherosclerosis. No lymphadenopathy by CT size criteria. 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 2.2 cm LEFT Bartholin versus Gartner cyst. OTHER: No intraperitoneal free fluid or free air. MUSCULOSKELETAL: Nonacute. Decreased size of small to moderate wide necked residual anterior abdominal wall fat containing ventral hernias. A knuckle of bowel at the hernia wall does not extend into the sac. IMPRESSION: 1. No acute intra-abdominal or pelvic process. 2. Decreased size of small to moderate fat containing ventral hernias. 3.  Aortic Atherosclerosis (ICD10-I70.0). Electronically Signed   By: Elon Alas M.D.   On: 10/31/2018 03:49    Procedures Procedures (including critical care time)  Medications Ordered in ED Medications  sodium chloride 0.9 % bolus 500 mL (0 mLs Intravenous Stopped 10/31/18 0204)  morphine 4 MG/ML injection 4 mg (4 mg Intravenous Given 10/31/18 0136)  ondansetron (ZOFRAN) injection 4 mg (4 mg Intravenous Given 10/31/18 0136)  iopamidol (ISOVUE-300) 61 % injection 30 mL (15 mLs Oral Contrast Given 10/31/18 0310)  iohexol (OMNIPAQUE) 300 MG/ML solution 100 mL (100 mLs Intravenous Contrast Given 10/31/18 0314)     Initial Impression / Assessment  and Plan / ED Course  I have reviewed the triage vital signs and the nursing notes.  Pertinent labs & imaging results that were available during my care of the patient were reviewed by me and considered in my medical decision making (see chart for details).        Patient presents to the emergency  department for evaluation of 2 days of abdominal pain with nausea, vomiting, diarrhea.  Patient also has had some cough.  Oxygen saturations are normal.  Lungs are clear on exam.  Chest x-ray is negative.  Patient's abdominal exam revealed mild diffuse tenderness, no guarding or rebound.  She did have some leukocytosis on blood work, therefore underwent CT scan.  CT scan was unremarkable as well.  Final Clinical Impressions(s) / ED Diagnoses   Final diagnoses:  Generalized abdominal pain    ED Discharge Orders         Ordered    ondansetron (ZOFRAN) 4 MG tablet  Every 6 hours     10/31/18 0357    loperamide (IMODIUM) 2 MG capsule  4 times daily PRN     10/31/18 0357    dicyclomine (BENTYL) 20 MG tablet  3 times daily before meals     10/31/18 0357           Orpah Greek, MD 10/31/18 [phone]

## 2018-10-31 NOTE — ED Notes (Signed)
Pt transported to radiology via wheelchair.

## 2018-10-31 NOTE — ED Notes (Signed)
Pt lying on floor upon arrival into room by this RN.

## 2018-10-31 NOTE — ED Triage Notes (Signed)
N/V/D and abdominal pain x 2 days. Denies fever.

## 2018-11-02 ENCOUNTER — Emergency Department (HOSPITAL_BASED_OUTPATIENT_CLINIC_OR_DEPARTMENT_OTHER): Payer: Medicare Other

## 2018-11-02 ENCOUNTER — Inpatient Hospital Stay (HOSPITAL_BASED_OUTPATIENT_CLINIC_OR_DEPARTMENT_OTHER)
Admission: EM | Admit: 2018-11-02 | Discharge: 2018-11-08 | DRG: 281 | Disposition: A | Discharge status: Home / Self Care | Payer: Medicare Other | Attending: Internal Medicine | Admitting: Internal Medicine

## 2018-11-02 ENCOUNTER — Other Ambulatory Visit: Payer: Self-pay

## 2018-11-02 ENCOUNTER — Encounter (HOSPITAL_BASED_OUTPATIENT_CLINIC_OR_DEPARTMENT_OTHER): Payer: Self-pay | Admitting: *Deleted

## 2018-11-02 DIAGNOSIS — I959 Hypotension, unspecified: Secondary | ICD-10-CM | POA: Diagnosis not present

## 2018-11-02 DIAGNOSIS — Z20828 Contact with and (suspected) exposure to other viral communicable diseases: Secondary | ICD-10-CM | POA: Diagnosis present

## 2018-11-02 DIAGNOSIS — R1084 Generalized abdominal pain: Secondary | ICD-10-CM | POA: Diagnosis present

## 2018-11-02 DIAGNOSIS — I214 Non-ST elevation (NSTEMI) myocardial infarction: Secondary | ICD-10-CM | POA: Diagnosis present

## 2018-11-02 DIAGNOSIS — Z79899 Other long term (current) drug therapy: Secondary | ICD-10-CM | POA: Diagnosis not present

## 2018-11-02 DIAGNOSIS — I2584 Coronary atherosclerosis due to calcified coronary lesion: Secondary | ICD-10-CM | POA: Diagnosis present

## 2018-11-02 DIAGNOSIS — R9431 Abnormal electrocardiogram [ECG] [EKG]: Secondary | ICD-10-CM | POA: Diagnosis not present

## 2018-11-02 DIAGNOSIS — M549 Dorsalgia, unspecified: Secondary | ICD-10-CM | POA: Diagnosis not present

## 2018-11-02 DIAGNOSIS — K219 Gastro-esophageal reflux disease without esophagitis: Secondary | ICD-10-CM | POA: Diagnosis present

## 2018-11-02 DIAGNOSIS — I25118 Atherosclerotic heart disease of native coronary artery with other forms of angina pectoris: Secondary | ICD-10-CM | POA: Diagnosis present

## 2018-11-02 DIAGNOSIS — I213 ST elevation (STEMI) myocardial infarction of unspecified site: Secondary | ICD-10-CM | POA: Diagnosis not present

## 2018-11-02 DIAGNOSIS — I2510 Atherosclerotic heart disease of native coronary artery without angina pectoris: Secondary | ICD-10-CM

## 2018-11-02 DIAGNOSIS — Z23 Encounter for immunization: Secondary | ICD-10-CM | POA: Diagnosis not present

## 2018-11-02 DIAGNOSIS — J449 Chronic obstructive pulmonary disease, unspecified: Secondary | ICD-10-CM | POA: Diagnosis present

## 2018-11-02 DIAGNOSIS — R531 Weakness: Secondary | ICD-10-CM | POA: Diagnosis not present

## 2018-11-02 DIAGNOSIS — Z86718 Personal history of other venous thrombosis and embolism: Secondary | ICD-10-CM | POA: Diagnosis not present

## 2018-11-02 DIAGNOSIS — I249 Acute ischemic heart disease, unspecified: Secondary | ICD-10-CM | POA: Diagnosis not present

## 2018-11-02 DIAGNOSIS — F17200 Nicotine dependence, unspecified, uncomplicated: Secondary | ICD-10-CM | POA: Diagnosis present

## 2018-11-02 DIAGNOSIS — F17210 Nicotine dependence, cigarettes, uncomplicated: Secondary | ICD-10-CM | POA: Diagnosis present

## 2018-11-02 DIAGNOSIS — Z86711 Personal history of pulmonary embolism: Secondary | ICD-10-CM | POA: Diagnosis not present

## 2018-11-02 DIAGNOSIS — R079 Chest pain, unspecified: Secondary | ICD-10-CM | POA: Diagnosis not present

## 2018-11-02 DIAGNOSIS — R011 Cardiac murmur, unspecified: Secondary | ICD-10-CM | POA: Diagnosis present

## 2018-11-02 DIAGNOSIS — I440 Atrioventricular block, first degree: Secondary | ICD-10-CM | POA: Diagnosis present

## 2018-11-02 DIAGNOSIS — R0602 Shortness of breath: Secondary | ICD-10-CM | POA: Diagnosis not present

## 2018-11-02 DIAGNOSIS — I10 Essential (primary) hypertension: Secondary | ICD-10-CM | POA: Diagnosis present

## 2018-11-02 DIAGNOSIS — E871 Hypo-osmolality and hyponatremia: Secondary | ICD-10-CM | POA: Diagnosis not present

## 2018-11-02 DIAGNOSIS — I2542 Coronary artery dissection: Secondary | ICD-10-CM | POA: Diagnosis not present

## 2018-11-02 DIAGNOSIS — E785 Hyperlipidemia, unspecified: Secondary | ICD-10-CM | POA: Diagnosis not present

## 2018-11-02 DIAGNOSIS — E861 Hypovolemia: Secondary | ICD-10-CM | POA: Diagnosis present

## 2018-11-02 DIAGNOSIS — I9589 Other hypotension: Secondary | ICD-10-CM | POA: Diagnosis not present

## 2018-11-02 HISTORY — DX: Tobacco use: Z72.0

## 2018-11-02 LAB — BASIC METABOLIC PANEL
Anion gap: 10 (ref 5–15)
BUN: 13 mg/dL (ref 8–23)
CO2: 26 mmol/L (ref 22–32)
Calcium: 9.1 mg/dL (ref 8.9–10.3)
Chloride: 93 mmol/L — ABNORMAL LOW (ref 98–111)
Creatinine, Ser: 0.89 mg/dL (ref 0.44–1.00)
GFR calc Af Amer: >60 mL/min (ref >60)
GFR calc non Af Amer: >60 mL/min (ref >60)
Glucose, Bld: 154 mg/dL — ABNORMAL HIGH (ref 70–99)
Potassium: 3.3 mmol/L — ABNORMAL LOW (ref 3.5–5.1)
Sodium: 129 mmol/L — ABNORMAL LOW (ref 135–145)

## 2018-11-02 LAB — CBC
HCT: 48.4 % — ABNORMAL HIGH (ref 36.0–46.0)
Hemoglobin: 16.2 g/dL — ABNORMAL HIGH (ref 12.0–15.0)
MCH: 27.7 pg (ref 26.0–34.0)
MCHC: 33.5 g/dL (ref 30.0–36.0)
MCV: 82.9 fL (ref 80.0–100.0)
Platelets: 371 10*3/uL (ref 150–400)
RBC: 5.84 MIL/uL — ABNORMAL HIGH (ref 3.87–5.11)
RDW: 13.7 % (ref 11.5–15.5)
WBC: 13.7 10*3/uL — ABNORMAL HIGH (ref 4.0–10.5)
nRBC: 0 % (ref 0.0–0.2)

## 2018-11-02 LAB — TROPONIN I: Troponin I: 7.06 ng/mL (ref <0.03)

## 2018-11-02 MED ORDER — ASPIRIN 81 MG PO CHEW
324.0000 mg | CHEWABLE_TABLET | Freq: Once | ORAL | Status: AC
  Administered 2018-11-02: 22:00:00 324 mg via ORAL
  Filled 2018-11-02: qty 4

## 2018-11-02 MED ORDER — HEPARIN (PORCINE) 25000 UT/250ML-% IV SOLN
1150.0000 [IU]/h | INTRAVENOUS | Status: DC
  Administered 2018-11-02: 22:00:00 1050 [IU]/h via INTRAVENOUS
  Administered 2018-11-03 – 2018-11-04 (×2): 1150 [IU]/h via INTRAVENOUS
  Filled 2018-11-02 (×3): qty 250

## 2018-11-02 MED ORDER — NITROGLYCERIN 0.4 MG SL SUBL
0.4000 mg | SUBLINGUAL_TABLET | SUBLINGUAL | Status: DC | PRN
  Administered 2018-11-02: 22:00:00 0.4 mg via SUBLINGUAL
  Filled 2018-11-02: qty 1

## 2018-11-02 MED ORDER — SODIUM CHLORIDE 0.9% FLUSH
3.0000 mL | Freq: Once | INTRAVENOUS | Status: DC
  Filled 2018-11-02: qty 3

## 2018-11-02 MED ORDER — HEPARIN BOLUS VIA INFUSION
3350.0000 [IU] | Freq: Once | INTRAVENOUS | Status: AC
  Administered 2018-11-02: 22:00:00 3350 [IU] via INTRAVENOUS

## 2018-11-02 MED ORDER — NITROGLYCERIN IN D5W 200-5 MCG/ML-% IV SOLN
0.0000 ug/min | INTRAVENOUS | Status: DC
  Administered 2018-11-02: 23:00:00 5 ug/min via INTRAVENOUS
  Filled 2018-11-02: qty 250

## 2018-11-02 MED ORDER — HEPARIN (PORCINE) 25000 UT/250ML-% IV SOLN
INTRAVENOUS | Status: AC
  Filled 2018-11-02: qty 250

## 2018-11-02 MED ORDER — IOHEXOL 350 MG/ML SOLN
100.0000 mL | Freq: Once | INTRAVENOUS | Status: AC | PRN
  Administered 2018-11-02: 74 mL via INTRAVENOUS

## 2018-11-02 MED ORDER — KETOROLAC TROMETHAMINE 30 MG/ML IJ SOLN
15.0000 mg | Freq: Once | INTRAMUSCULAR | Status: DC
  Administered 2018-11-02: 15 mg via INTRAVENOUS
  Filled 2018-11-02: qty 1

## 2018-11-02 NOTE — ED Triage Notes (Signed)
Pt seen here on Thursday for similar symptoms. States she is still having chest pain, back pain, SOB and is feeling "very weak"

## 2018-11-02 NOTE — ED Notes (Signed)
Second dose of nitro given

## 2018-11-02 NOTE — ED Notes (Signed)
Pt daughter York Cerise) [phone]

## 2018-11-02 NOTE — ED Notes (Addendum)
  Dr. Tyrone Nine aware that troponin level is 7.06

## 2018-11-02 NOTE — ED Notes (Signed)
[facility] arrived to transport pt to [facility] 

## 2018-11-02 NOTE — ED Notes (Signed)
ED Provider at bedside. 

## 2018-11-02 NOTE — ED Provider Notes (Signed)
[facility] EMERGENCY DEPARTMENT Provider Note   CSN: [id number] Arrival date & time: 11/02/18  2016    History   Chief Complaint Chief Complaint  Patient presents with  . Chest Pain    HPI Vanessa Campos is a 68 y.o. female with PMHx COPD, CAD, DVT, PE, and HTN who presents to the ED complaining of sudden onset, constant, sharp, 9/10, central chest pain radiating to back x 4 days. The pain is worsened with deep inspiration. Pt also complains of shortness of breath. She was recently seen in the ED on 04/03 for abdominal pain. Daughter was not with pt at that time but is present at bedside currently; believes pt may not have mentioned the chest pain as she was having epigastric abdominal pain as well with nausea, vomiting, and diarrhea at that time. Pt believes the pain slowly began radiating to her chest more than her abdomen through the upcoming days. Negative workup at time of initial ED visit including negative CT A/P. Pt was discharged with Zofran, Bentyl, and Imodium which has helped her abdominal pain and nausea/vomiting/diarrhea but not the chest pain. Denies fever, chills, cough, diaphoresis, or any other associated symptoms. No recent prolonged travel or immobilization. No estrogen therapy. Pt is an active smoker, smokes about 1 ppd. No active malignancy. Has a positive hx of DVT and PE, most recent about 5 years ago. No hx of MI in the past. No fhx of CAD < 45 years old.     Past Medical History:  Diagnosis Date  . Asthma   . COPD (chronic obstructive pulmonary disease) ([place])   . Coronary artery disease   . DVT (deep venous thrombosis) ([place]) 2014   provoked, treated with 6 months warfarin  . GERD (gastroesophageal reflux disease)   . Hyperlipidemia   . Hypertension     Patient Active Problem List   Diagnosis Date Noted  . NSTEMI (non-ST elevated myocardial infarction) ([place]) 11/02/2018  . COPD, mild ([place]) 02/02/2013  . Pulmonary embolism on right ([place])  10/14/2012  . Hypotension, unspecified 10/11/2012  . Thyroid nodule 10/08/2012  . Unspecified constipation 10/08/2012  . Tobacco use disorder 10/05/2012  . GERD (gastroesophageal reflux disease) 10/05/2012    Past Surgical History:  Procedure Laterality Date  . ABDOMINAL SURGERY    . CARPAL TUNNEL RELEASE       OB History   No obstetric history on file.      Home Medications    Prior to Admission medications   Medication Sig Start Date End Date Taking? Authorizing Provider  albuterol (PROVENTIL HFA;VENTOLIN HFA) 108 (90 BASE) MCG/ACT inhaler Inhale 2 puffs into the lungs every 6 (six) hours as needed for wheezing. Patient not taking: Reported on 10/29/2018 07/18/15   Juanito Doom, MD  diclofenac sodium (VOLTAREN) 1 % GEL Apply 2 g topically 4 (four) times daily as needed. Patient not taking: Reported on 10/29/2018 10/19/18   Ward, Ozella Almond, PA-C  dicyclomine (BENTYL) 20 MG tablet Take 1 tablet (20 mg total) by mouth 3 (three) times daily before meals. 10/31/18   Orpah Greek, MD  loperamide (IMODIUM) 2 MG capsule Take 1 capsule (2 mg total) by mouth 4 (four) times daily as needed for diarrhea or loose stools. 10/31/18   Orpah Greek, MD  ondansetron (ZOFRAN) 4 MG tablet Take 1 tablet (4 mg total) by mouth every 6 (six) hours. 10/31/18   Orpah Greek, MD    Family History No family history on file.  Social History Social History   Tobacco Use  . Smoking status: Current Every Day Smoker    Packs/day: 1.00    Years: 40.00    Pack years: 40.00    Types: Cigarettes    Last attempt to quit: 10/05/2012    Years since quitting: 6.0  . Smokeless tobacco: Never Used  . Tobacco comment: started smoking 4 mos ago 07/18/15  Substance Use Topics  . Alcohol use: No    Alcohol/week: 0.0 standard drinks  . Drug use: No     Allergies   Tramadol and Vicodin [hydrocodone-acetaminophen]   Review of Systems Review of Systems  Constitutional:  Negative for chills and fever.  HENT: Negative for ear pain and sore throat.   Eyes: Negative for visual disturbance.  Respiratory: Positive for shortness of breath. Negative for cough.   Cardiovascular: Positive for chest pain. Negative for leg swelling.  Gastrointestinal: Negative for abdominal pain (resolved), nausea (resolved) and vomiting (resolved).  Genitourinary: Negative for difficulty urinating.  Musculoskeletal: Positive for back pain. Negative for neck pain.  Skin: Negative for rash.  Neurological: Positive for weakness (generalized). Negative for syncope and numbness.     Physical Exam Updated Vital Signs BP 119/72 (BP Location: Right Arm)   Pulse 83   Temp 97.7 F (36.5 C) (Oral)   Resp 16   Ht 5\' 3"  (1.6 m)   Wt 68 kg   SpO2 93%   BMI 26.56 kg/m   Physical Exam Vitals signs and nursing note reviewed.  Constitutional:      Appearance: She is not ill-appearing.  HENT:     Head: Normocephalic and atraumatic.  Eyes:     Conjunctiva/sclera: Conjunctivae normal.  Neck:     Musculoskeletal: Neck supple.  Cardiovascular:     Rate and Rhythm: Normal rate and regular rhythm.     Pulses:          Carotid pulses are 2+ on the right side and 2+ on the left side.      Radial pulses are 2+ on the right side and 2+ on the left side.       Dorsalis pedis pulses are 2+ on the right side and 2+ on the left side.     Heart sounds: Normal heart sounds. No systolic murmur.  Pulmonary:     Effort: Pulmonary effort is normal. No tachypnea.     Breath sounds: Normal breath sounds. No decreased breath sounds, wheezing, rhonchi or rales.  Chest:     Chest wall: Tenderness present. No mass or crepitus.     Comments: Exquisitely tender to palpation along anterior chest wall Abdominal:     General: Bowel sounds are normal.     Palpations: Abdomen is soft.     Tenderness: There is no abdominal tenderness. There is no guarding or rebound.  Musculoskeletal:     Right lower leg: No  edema.     Left lower leg: No edema.     Comments: No C spine tenderness to palpation. Tenderness to diffuse thoracic and lumbar paraspinal muscles.   Skin:    General: Skin is warm and dry.     Capillary Refill: Capillary refill takes less than 2 seconds.  Neurological:     Mental Status: She is alert.      ED Treatments / Results  Labs (all labs ordered are listed, but only abnormal results are displayed) Labs Reviewed  BASIC METABOLIC PANEL - Abnormal; Notable for the following components:  Result Value   Sodium 129 (*)    Potassium 3.3 (*)    Chloride 93 (*)    Glucose, Bld 154 (*)    All other components within normal limits  CBC - Abnormal; Notable for the following components:   WBC 13.7 (*)    RBC 5.84 (*)    Hemoglobin 16.2 (*)    HCT 48.4 (*)    All other components within normal limits  TROPONIN I - Abnormal; Notable for the following components:   Troponin I 7.06 (*)    All other components within normal limits  HEPARIN LEVEL (UNFRACTIONATED)    EKG EKG Interpretation  Date/Time:  Sunday November 02 2018 21:52:23 EDT Ventricular Rate:  96 PR Interval:    QRS Duration: 81 QT Interval:  354 QTC Calculation: 448 R Axis:   -34 Text Interpretation:  Sinus or ectopic atrial rhythm Left axis deviation RSR' in V1 or V2, right VCD or RVH No significant change since last tracing Confirmed by Deno Etienne [id number]) on 11/02/2018 10:12:14 PM   Radiology Dg Chest 2 View  Result Date: 11/02/2018 CLINICAL DATA:  Chest pain 2 days with back spasms. EXAM: CHEST - 2 VIEW COMPARISON:  10/31/2018 FINDINGS: Lungs are adequately inflated without consolidation or effusion. Cardiomediastinal silhouette and remainder of the exam is unchanged. IMPRESSION: No active cardiopulmonary disease. Electronically Signed   By: Marin Olp M.D.   On: 11/02/2018 20:56   Ct Angio Chest Pe W/cm &/or Wo Cm  Result Date: 11/02/2018 CLINICAL DATA:  Chest pain and shortness of breath EXAM: CT  ANGIOGRAPHY CHEST WITH CONTRAST TECHNIQUE: Multidetector CT imaging of the chest was performed using the standard protocol during bolus administration of intravenous contrast. Multiplanar CT image reconstructions and MIPs were obtained to evaluate the vascular anatomy. CONTRAST:  19mL OMNIPAQUE IOHEXOL 350 MG/ML SOLN COMPARISON:  CTA chest 01/12/2013 FINDINGS: Cardiovascular: --Pulmonary arteries: Contrast injection is sufficient to demonstrate satisfactory opacification of the pulmonary arteries to the segmental level. There is no pulmonary embolus. The main pulmonary artery is within normal limits for size. --Aorta: Limited opacification of the aorta due to bolus timing optimization for the pulmonary arteries. Conventional 3 vessel aortic branching pattern. The aortic course and caliber are normal. There is mild aortic atherosclerosis. --Heart: Normal size. No pericardial effusion. Mediastinum/Nodes: No mediastinal, hilar or axillary lymphadenopathy. The visualized thyroid and thoracic esophageal course are unremarkable. Lungs/Pleura: No pulmonary nodules or masses. No pleural effusion or pneumothorax. No focal airspace consolidation. No focal pleural abnormality. Upper Abdomen: Contrast bolus timing is not optimized for evaluation of the abdominal organs. Large left renal cyst is incompletely visualized but measures up to 3.7 cm. There is vicarious excretion of contrast in the gallbladder, likely from the CT of the abdomen and pelvis performed 10/31/2018 Musculoskeletal: No chest wall abnormality. No acute or significant osseous findings. Review of the MIP images confirms the above findings. IMPRESSION: No pulmonary embolus or other acute thoracic abnormality. Electronically Signed   By: Ulyses Jarred M.D.   On: 11/02/2018 21:58    Procedures Procedures (including critical care time)  Medications Ordered in ED Medications  sodium chloride flush (NS) 0.9 % injection 3 mL (3 mLs Intravenous Not Given 11/02/18  2037)  nitroGLYCERIN (NITROSTAT) SL tablet 0.4 mg (0.4 mg Sublingual Given 11/02/18 2146)  heparin ADULT infusion 100 units/mL (25000 units/250mL sodium chloride 0.45%) (1,050 Units/hr Intravenous New Bag/Given 11/02/18 2150)  nitroGLYCERIN 50 mg in dextrose 5 % 250 mL (0.2 mg/mL) infusion (has no administration in  time range)  iohexol (OMNIPAQUE) 350 MG/ML injection 100 mL (74 mLs Intravenous Contrast Given 11/02/18 2133)  aspirin chewable tablet 324 mg (324 mg Oral Given 11/02/18 2144)  heparin bolus via infusion 3,350 Units (3,350 Units Intravenous Bolus from Bag 11/02/18 2150)     Initial Impression / Assessment and Plan / ED Course  I have reviewed the triage vital signs and the nursing notes.  Pertinent labs & imaging results that were available during my care of the patient were reviewed by me and considered in my medical decision making (see chart for details).    Pt is a 68 year old female who presents for chest pain radiating to back with SOB. Able to reproduce pain with palpation. Pain is pleuritic in nature; no change with exertion.  Has hx of COPD and is an active smoker. LCTAB, satting 95% on RA which is pt's baseline. Do not suspect SOB is related to COPD exacerbation. Has hx of DVT and PE in the past, most recent 5 years ago. Concern for ACS vs PE at this time. No other risk factors for PE besides history of same. Will get ACS workup as well as CTA Chest to rule out PE.   Nursing staff informed that troponin 7.06. Aspirin and NTG q5 min x 3 ordered. Pt on her way to CTA at the moment. Heparin per pharmacy consult ordered as well.   CTA negative for PE. Upon reeval pt's blood pressure in the 30Q systolic. 3rd NTG held off at the moment; pt reports pain slightly improved with 2 NTG. 1 L NS bolus given to patient as well; blood pressure increased to 65'H systolic while in the room. Dr. Tyrone Nine attending physician to consult cardiology for admission. Pacer pads placed on pt prophylactically  incase she goes into any dysrhythmia.   Dr. Tyrone Nine spoke with cardiologist Dr. Haroldine Laws who is in agreement for admission at this time. Blood pressures have significantly improved with 1 L NS. Nitro drip ordered and heparin bolus started.   Vitals:   11/02/18 2204 11/02/18 2212 11/02/18 2215 11/02/18 2230  BP: 90/72 134/82 137/78 131/89  Pulse: 96 66 64 70  Resp:  (!) 26 (!) 21 20  Temp:      TempSrc:      SpO2: 92% 93% 96% 95%  Weight:      Height:         HEAR Score: 4    Final Clinical Impressions(s) / ED Diagnoses   Final diagnoses:  NSTEMI (non-ST elevated myocardial infarction) [facility])    ED Discharge Orders    None       Eustaquio Maize, PA-C 11/02/18 [place], [place], DO 11/02/18 2255

## 2018-11-02 NOTE — ED Notes (Signed)
Patient transported to X-ray 

## 2018-11-02 NOTE — ED Notes (Signed)
EDP informed of critical trop. See [facility]

## 2018-11-02 NOTE — ED Notes (Signed)
ED TO INPATIENT HANDOFF REPORT  ED Nurse Name and Phone #:  Shelda Pal [place] Name/Age/Gender Vanessa Campos 68 y.o. female Room/Bed: MH10/MH10  Code Status   Code Status: Prior  Home/SNF/Other Home Patient oriented to: self, place, time and situation Is this baseline? Yes   Triage Complete: Triage complete  Chief Complaint Chest Pain, Weakness, SOB  Triage Note Pt seen here on Thursday for similar symptoms. States she is still having chest pain, back pain, SOB and is feeling "very weak"   Allergies Allergies  Allergen Reactions  . Tramadol     shaking  . Vicodin [Hydrocodone-Acetaminophen] Other (See Comments)    Shaking     Level of Care/Admitting Diagnosis ED Disposition    ED Disposition Condition [facility] Area: [place] [[id number]]  Level of Care: Progressive [102]  Diagnosis: NSTEMI (non-ST elevated myocardial infarction) [facility]) [[id number]]  Admitting Physician: Jolaine Artist [[id number]]  Attending Physician: Jolaine Artist [[id number]]  Estimated length of stay: past midnight tomorrow  Certification:: I certify this patient will need inpatient services for at least 2 midnights  PT Class (Do Not Modify): Inpatient [101]  PT Acc Code (Do Not Modify): Private [1]       B Medical/Surgery History Past Medical History:  Diagnosis Date  . Asthma   . COPD (chronic obstructive pulmonary disease) ([place])   . Coronary artery disease   . DVT (deep venous thrombosis) ([place]) 2014   provoked, treated with 6 months warfarin  . GERD (gastroesophageal reflux disease)   . Hyperlipidemia   . Hypertension    Past Surgical History:  Procedure Laterality Date  . ABDOMINAL SURGERY    . CARPAL TUNNEL RELEASE       A IV Location/Drains/Wounds Patient Lines/Drains/Airways Status   Active Line/Drains/Airways    Name:   Placement date:   Placement time:   Site:   Days:   Peripheral IV 11/02/18 Right Antecubital   11/02/18     2057    Antecubital   less than 1   Peripheral IV 11/02/18 Left Antecubital   11/02/18    2151    Antecubital   less than 1          Intake/Output Last 24 hours No intake or output data in the 24 hours ending 11/02/18 2304  Labs/Imaging Results for orders placed or performed during the hospital encounter of 11/02/18 (from the past 48 hour(s))  Basic metabolic panel     Status: Abnormal   Collection Time: 11/02/18  8:57 PM  Result Value Ref Range   Sodium 129 (L) 135 - 145 mmol/L   Potassium 3.3 (L) 3.5 - 5.1 mmol/L   Chloride 93 (L) 98 - 111 mmol/L   CO2 26 22 - 32 mmol/L   Glucose, Bld 154 (H) 70 - 99 mg/dL   BUN 13 8 - 23 mg/dL   Creatinine, Ser 0.89 0.44 - 1.00 mg/dL   Calcium 9.1 8.9 - 10.3 mg/dL   GFR calc non Af Amer >60 >60 mL/min   GFR calc Af Amer >60 >60 mL/min   Anion gap 10 5 - 15    Comment: Performed at [facility], [address] [place], [place] [address]  CBC     Status: Abnormal   Collection Time: 11/02/18  8:57 PM  Result Value Ref Range   WBC 13.7 (H) 4.0 - 10.5 K/uL   RBC 5.84 (H) 3.87 - 5.11 MIL/uL  Hemoglobin 16.2 (H) 12.0 - 15.0 g/dL   HCT 48.4 (H) 36.0 - 46.0 %   MCV 82.9 80.0 - 100.0 fL   MCH 27.7 26.0 - 34.0 pg   MCHC 33.5 30.0 - 36.0 g/dL   RDW 13.7 11.5 - 15.5 %   Platelets 371 150 - 400 K/uL   nRBC 0.0 0.0 - 0.2 %    Comment: Performed at [facility], [address] [place], [place] [address]  Troponin I - ONCE - STAT     Status: Abnormal   Collection Time: 11/02/18  8:57 PM  Result Value Ref Range   Troponin I 7.06 (HH) <0.03 ng/mL    Comment: CRITICAL RESULT CALLED TO, READ BACK BY AND VERIFIED WITH: MAYNARD,C,RN @ 2130 11/02/18 BY GWYN,P Performed at [facility], [address] [place], [place] [address]    Dg Chest 2 View  Result Date: 11/02/2018 CLINICAL DATA:  Chest pain 2 days with back spasms. EXAM: CHEST - 2 VIEW COMPARISON:  10/31/2018 FINDINGS: Lungs are adequately inflated without  consolidation or effusion. Cardiomediastinal silhouette and remainder of the exam is unchanged. IMPRESSION: No active cardiopulmonary disease. Electronically Signed   By: Marin Olp M.D.   On: 11/02/2018 20:56   Ct Angio Chest Pe W/cm &/or Wo Cm  Result Date: 11/02/2018 CLINICAL DATA:  Chest pain and shortness of breath EXAM: CT ANGIOGRAPHY CHEST WITH CONTRAST TECHNIQUE: Multidetector CT imaging of the chest was performed using the standard protocol during bolus administration of intravenous contrast. Multiplanar CT image reconstructions and MIPs were obtained to evaluate the vascular anatomy. CONTRAST:  84mL OMNIPAQUE IOHEXOL 350 MG/ML SOLN COMPARISON:  CTA chest 01/12/2013 FINDINGS: Cardiovascular: --Pulmonary arteries: Contrast injection is sufficient to demonstrate satisfactory opacification of the pulmonary arteries to the segmental level. There is no pulmonary embolus. The main pulmonary artery is within normal limits for size. --Aorta: Limited opacification of the aorta due to bolus timing optimization for the pulmonary arteries. Conventional 3 vessel aortic branching pattern. The aortic course and caliber are normal. There is mild aortic atherosclerosis. --Heart: Normal size. No pericardial effusion. Mediastinum/Nodes: No mediastinal, hilar or axillary lymphadenopathy. The visualized thyroid and thoracic esophageal course are unremarkable. Lungs/Pleura: No pulmonary nodules or masses. No pleural effusion or pneumothorax. No focal airspace consolidation. No focal pleural abnormality. Upper Abdomen: Contrast bolus timing is not optimized for evaluation of the abdominal organs. Large left renal cyst is incompletely visualized but measures up to 3.7 cm. There is vicarious excretion of contrast in the gallbladder, likely from the CT of the abdomen and pelvis performed 10/31/2018 Musculoskeletal: No chest wall abnormality. No acute or significant osseous findings. Review of the MIP images confirms the above  findings. IMPRESSION: No pulmonary embolus or other acute thoracic abnormality. Electronically Signed   By: Ulyses Jarred M.D.   On: 11/02/2018 21:58    Pending Labs Unresulted Labs (From admission, onward)    Start     Ordered   11/03/18 0400  Heparin level (unfractionated)  Once-Timed,   R     11/02/18 2146          Vitals/Pain Today's Vitals   11/02/18 2250 11/02/18 2253 11/02/18 2255 11/02/18 2300  BP:  117/82 118/81 119/83  Pulse:  98 88 73  Resp:  (!) 27 (!) 28 15  Temp:      TempSrc:      SpO2:  97% 93% 97%  Weight:      Height:  PainSc: 6        Isolation Precautions No active isolations  Medications Medications  sodium chloride flush (NS) 0.9 % injection 3 mL (3 mLs Intravenous Not Given 11/02/18 2037)  nitroGLYCERIN (NITROSTAT) SL tablet 0.4 mg (0.4 mg Sublingual Given 11/02/18 2146)  heparin ADULT infusion 100 units/mL (25000 units/21mL sodium chloride 0.45%) (1,050 Units/hr Intravenous New Bag/Given 11/02/18 2150)  nitroGLYCERIN 50 mg in dextrose 5 % 250 mL (0.2 mg/mL) infusion (5 mcg/min Intravenous New Bag/Given 11/02/18 2252)  iohexol (OMNIPAQUE) 350 MG/ML injection 100 mL (74 mLs Intravenous Contrast Given 11/02/18 2133)  aspirin chewable tablet 324 mg (324 mg Oral Given 11/02/18 2144)  heparin bolus via infusion 3,350 Units (3,350 Units Intravenous Bolus from Bag 11/02/18 2150)    Mobility walks Low fall risk   Focused Assessments Cardiac Assessment Handoff:    Lab Results  Component Value Date   TROPONINI 7.06 (Universal) 11/02/2018   Lab Results  Component Value Date   DDIMER  02/09/2008    <0.22        AT THE INHOUSE ESTABLISHED CUTOFF VALUE OF 0.48 ug/mL FEU, THIS ASSAY HAS BEEN DOCUMENTED IN THE LITERATURE TO HAVE   Does the Patient currently have chest pain? Yes     R Recommendations: See Admitting Provider Note  Report given to:   Additional Notes:

## 2018-11-02 NOTE — ED Notes (Signed)
3rd sublingual nitro held at this time. EDP at bedside evaluating pt. Pt hypotensive. Reports chest pain feels slightly better. Pt appears to be more comfortable.

## 2018-11-02 NOTE — Progress Notes (Signed)
ANTICOAGULATION CONSULT NOTE - Initial Consult  Pharmacy Consult for heparin Indication: pulmonary embolus  Allergies  Allergen Reactions  . Tramadol     shaking  . Vicodin [Hydrocodone-Acetaminophen] Other (See Comments)    Shaking     Patient Measurements: Height: 5\' 3"  (160 cm) Weight: 149 lb 14.6 oz (68 kg) IBW/kg (Calculated) : 52.4 Heparin Dosing Weight: 66.2 kg  Vital Signs: Temp: 97.7 F (36.5 C) (04/05 2032) Temp Source: Oral (04/05 2032) BP: 119/72 (04/05 2032) Pulse Rate: 83 (04/05 2032)  Labs: Recent Labs    10/31/18 0137 11/02/18 2057  HGB 16.2* 16.2*  HCT 48.0* 48.4*  PLT 450* 371  CREATININE 0.65 0.89  TROPONINI  --  7.06*    Estimated Creatinine Clearance: 57.5 mL/min (by C-G formula based on SCr of 0.89 mg/dL).   Medical History: Past Medical History:  Diagnosis Date  . Asthma   . COPD (chronic obstructive pulmonary disease) ([place])   . Coronary artery disease   . DVT (deep venous thrombosis) ([place]) 2014   provoked, treated with 6 months warfarin  . GERD (gastroesophageal reflux disease)   . Hyperlipidemia   . Hypertension     Medications:  Scheduled:  . aspirin  324 mg Oral Once  . sodium chloride flush  3 mL Intravenous Once    Assessment: 73 yof presenting with chest/back pain and SOB. Has hx of DVT in 2014- previously treated with warfarin. Now no AC PTA.  Hgb 16.2, plt 371. Troponin 7.06. No s/sx of bleeding.   Goal of Therapy:  Heparin level 0.3-0.7 units/ml Monitor platelets by anticoagulation protocol: Yes   Plan:  Give 3350 units bolus x 1 Start heparin infusion at 1050 units/hr Check anti-Xa level in 6 hours and daily while on heparin Continue to monitor H&H and platelets  Antonietta Jewel, PharmD, BCCCP Clinical Pharmacist  Pager: [phone] Phone: [phone] 11/02/2018,9:36 PM

## 2018-11-03 ENCOUNTER — Encounter (HOSPITAL_COMMUNITY): Payer: Self-pay

## 2018-11-03 DIAGNOSIS — E785 Hyperlipidemia, unspecified: Secondary | ICD-10-CM

## 2018-11-03 DIAGNOSIS — I249 Acute ischemic heart disease, unspecified: Secondary | ICD-10-CM | POA: Diagnosis present

## 2018-11-03 DIAGNOSIS — I213 ST elevation (STEMI) myocardial infarction of unspecified site: Secondary | ICD-10-CM

## 2018-11-03 DIAGNOSIS — Z720 Tobacco use: Secondary | ICD-10-CM

## 2018-11-03 DIAGNOSIS — I214 Non-ST elevation (NSTEMI) myocardial infarction: Principal | ICD-10-CM

## 2018-11-03 DIAGNOSIS — J440 Chronic obstructive pulmonary disease with acute lower respiratory infection: Secondary | ICD-10-CM

## 2018-11-03 LAB — CBC
HCT: 44.6 % (ref 36.0–46.0)
Hemoglobin: 15.4 g/dL — ABNORMAL HIGH (ref 12.0–15.0)
MCH: 28.3 pg (ref 26.0–34.0)
MCHC: 34.5 g/dL (ref 30.0–36.0)
MCV: 82 fL (ref 80.0–100.0)
Platelets: 333 10*3/uL (ref 150–400)
RBC: 5.44 MIL/uL — ABNORMAL HIGH (ref 3.87–5.11)
RDW: 13.6 % (ref 11.5–15.5)
WBC: 16.1 10*3/uL — ABNORMAL HIGH (ref 4.0–10.5)
nRBC: 0 % (ref 0.0–0.2)

## 2018-11-03 LAB — BASIC METABOLIC PANEL
Anion gap: 11 (ref 5–15)
BUN: 9 mg/dL (ref 8–23)
CO2: 25 mmol/L (ref 22–32)
Calcium: 8.7 mg/dL — ABNORMAL LOW (ref 8.9–10.3)
Chloride: 95 mmol/L — ABNORMAL LOW (ref 98–111)
Creatinine, Ser: 0.76 mg/dL (ref 0.44–1.00)
GFR calc Af Amer: >60 mL/min (ref >60)
GFR calc non Af Amer: >60 mL/min (ref >60)
Glucose, Bld: 116 mg/dL — ABNORMAL HIGH (ref 70–99)
Potassium: 3.5 mmol/L (ref 3.5–5.1)
Sodium: 131 mmol/L — ABNORMAL LOW (ref 135–145)

## 2018-11-03 LAB — HEPARIN LEVEL (UNFRACTIONATED)
Heparin Unfractionated: 0.44 IU/mL (ref 0.30–0.70)
Heparin Unfractionated: 0.19 IU/mL — ABNORMAL LOW (ref 0.30–0.70)
Heparin Unfractionated: 0.63 IU/mL (ref 0.30–0.70)

## 2018-11-03 LAB — TSH: TSH: 1.803 u[IU]/mL (ref 0.350–4.500)

## 2018-11-03 LAB — HIV ANTIBODY (ROUTINE TESTING W REFLEX): HIV Screen 4th Generation wRfx: NONREACTIVE

## 2018-11-03 LAB — PROTIME-INR
INR: 1 (ref 0.8–1.2)
Prothrombin Time: 13.5 seconds (ref 11.4–15.2)

## 2018-11-03 LAB — TROPONIN I
Troponin I: 7.42 ng/mL (ref <0.03)
Troponin I: 6.35 ng/mL (ref <0.03)
Troponin I: 6.66 ng/mL (ref <0.03)

## 2018-11-03 LAB — MRSA PCR SCREENING: MRSA by PCR: NEGATIVE

## 2018-11-03 LAB — T4, FREE: Free T4: 1.22 ng/dL (ref 0.82–1.77)

## 2018-11-03 LAB — HEMOGLOBIN A1C
Hgb A1c MFr Bld: 5.7 % — ABNORMAL HIGH (ref 4.8–5.6)
Mean Plasma Glucose: 116.89 mg/dL

## 2018-11-03 MED ORDER — ASPIRIN EC 81 MG PO TBEC
81.0000 mg | DELAYED_RELEASE_TABLET | Freq: Every day | ORAL | Status: DC
  Administered 2018-11-04 – 2018-11-08 (×5): 81 mg via ORAL
  Filled 2018-11-03 (×4): qty 1

## 2018-11-03 MED ORDER — INFLUENZA VAC SPLIT HIGH-DOSE 0.5 ML IM SUSY
0.5000 mL | PREFILLED_SYRINGE | INTRAMUSCULAR | Status: AC
  Administered 2018-11-04: 0.5 mL via INTRAMUSCULAR
  Filled 2018-11-03 (×2): qty 0.5

## 2018-11-03 MED ORDER — POTASSIUM CHLORIDE CRYS ER 20 MEQ PO TBCR
40.0000 meq | EXTENDED_RELEASE_TABLET | Freq: Once | ORAL | Status: AC
  Administered 2018-11-03: 40 meq via ORAL
  Filled 2018-11-03: qty 2

## 2018-11-03 MED ORDER — NITROGLYCERIN IN D5W 200-5 MCG/ML-% IV SOLN: 2.0000 ug/min | INTRAVENOUS | Status: DC

## 2018-11-03 MED ORDER — METOPROLOL TARTRATE 12.5 MG HALF TABLET
12.5000 mg | ORAL_TABLET | Freq: Two times a day (BID) | ORAL | Status: DC
  Administered 2018-11-03: 12.5 mg via ORAL
  Filled 2018-11-03 (×2): qty 1

## 2018-11-03 MED ORDER — ONDANSETRON HCL 4 MG/2ML IJ SOLN: 4.0000 mg | Freq: Four times a day (QID) | INTRAMUSCULAR | Status: DC | PRN

## 2018-11-03 MED ORDER — ATORVASTATIN CALCIUM 40 MG PO TABS
40.0000 mg | ORAL_TABLET | Freq: Every day | ORAL | Status: DC
  Administered 2018-11-03 – 2018-11-07 (×5): 40 mg via ORAL
  Filled 2018-11-03 (×5): qty 1

## 2018-11-03 MED ORDER — PNEUMOCOCCAL VAC POLYVALENT 25 MCG/0.5ML IJ INJ
0.5000 mL | INJECTION | INTRAMUSCULAR | Status: AC
  Administered 2018-11-04: 0.5 mL via INTRAMUSCULAR
  Filled 2018-11-03: qty 0.5

## 2018-11-03 MED ORDER — HEPARIN BOLUS VIA INFUSION
2000.0000 [IU] | Freq: Once | INTRAVENOUS | Status: AC
  Administered 2018-11-03: 2000 [IU] via INTRAVENOUS
  Filled 2018-11-03: qty 2000

## 2018-11-03 NOTE — Progress Notes (Signed)
ANTICOAGULATION CONSULT NOTE -follow up  Pharmacy Consult for heparin Indication: STMEI, chest pain  (CT angio was negative for PE)   Allergies  Allergen Reactions  . Tramadol     shaking  . Vicodin [Hydrocodone-Acetaminophen] Other (See Comments)    Shaking     Patient Measurements: Height: 5\' 3"  (160 cm) Weight: 149 lb 14.6 oz (68 kg) IBW/kg (Calculated) : 52.4 Heparin Dosing Weight: 66.2 kg  Vital Signs: Temp: 98 F (36.7 C) (04/06 2017) Temp Source: Oral (04/06 2017) BP: 113/80 (04/06 2017) Pulse Rate: 74 (04/06 2017)  Labs: Recent Labs    11/02/18 2057 11/03/18 0723 11/03/18 0831 11/03/18 1413 11/03/18 2110  HGB 16.2* 15.4*  --   --   --   HCT 48.4* 44.6  --   --   --   PLT 371 333  --   --   --   LABPROT  --   --   --  13.5  --   INR  --   --   --  1.0  --   HEPARINUNFRC  --  0.44  --  0.19* 0.63  CREATININE 0.89 0.76  --   --   --   TROPONINI 7.06*  --  7.42* 6.35*  --     Estimated Creatinine Clearance: 64 mL/min (by C-G formula based on SCr of 0.76 mg/dL).   Medical History: Past Medical History:  Diagnosis Date  . Asthma   . COPD (chronic obstructive pulmonary disease) ([place])   . Coronary artery disease   . DVT (deep venous thrombosis) ([place]) 2014   provoked, treated with 6 months warfarin  . Dyspnea   . GERD (gastroesophageal reflux disease)   . Hyperlipidemia   . Hypertension     Medications:  Scheduled:  . [START ON 11/04/2018] aspirin EC  81 mg Oral Daily  . atorvastatin  40 mg Oral q1800  . [START ON 11/04/2018] Influenza vac split quadrivalent PF  0.5 mL Intramuscular Tomorrow-1000  . metoprolol tartrate  12.5 mg Oral BID  . [START ON 11/04/2018] pneumococcal 23 valent vaccine  0.5 mL Intramuscular Tomorrow-1000  . sodium chloride flush  3 mL Intravenous Once    Assessment: 53 yof presenting with chest/back pain and SOB. Has hx of DVT in 2014- previously treated with warfarin. Now no AC PTA.  Troponin 7.06 on admit 11/02/18.   Heparin  infusion started 11/02/18 PM.   Heparin level was therapeutic at 0.44 this AM on heparin rate 1050 units/hr.  Next 6 hr heparin level has decreased to 0.19 on 1050 ut/hr.  Heparin infusion continues for  STEMI, CP. Cardiology is planning left heart cath in morning once Covid negative.  CT angio neg for PE  Heparin level at goal this evening at 0.6. No bleeding issues noted. Continue at current rate.   Goal of Therapy:  Heparin level 0.3-0.7 units/ml Monitor platelets by anticoagulation protocol: Yes   Plan:  Continue heparin drip at 1150 units/hr Daily HL, CBC every AM Monitor for bleeding signs and symptoms.  Erin Hearing PharmD., BCPS Clinical Pharmacist 11/03/2018 10:11 PM

## 2018-11-03 NOTE — Progress Notes (Signed)
Received pt from [place] via  carelink.  Notified Md. Due to symptoms HCP wore surgical mask.   Saunders Revel T

## 2018-11-03 NOTE — Progress Notes (Signed)
Progress Note  Patient Name: Vanessa Campos Date of Encounter: 11/03/2018  Primary Cardiologist: No primary care provider on file.   Subjective   She complains of mild residual left-sided chest pain which at times is tightness and other times sharp.  Inpatient Medications    Scheduled Meds:  [START ON 11/04/2018] aspirin EC  81 mg Oral Daily   atorvastatin  40 mg Oral q1800   [START ON 11/04/2018] Influenza vac split quadrivalent PF  0.5 mL Intramuscular Tomorrow-1000   [START ON 11/04/2018] pneumococcal 23 valent vaccine  0.5 mL Intramuscular Tomorrow-1000   sodium chloride flush  3 mL Intravenous Once   Continuous Infusions:  heparin 1,050 Units/hr (11/03/18 0700)   nitroGLYCERIN 15 mcg/min (11/03/18 0700)   PRN Meds: nitroGLYCERIN, ondansetron (ZOFRAN) IV   Vital Signs    Vitals:   11/03/18 0500 11/03/18 0600 11/03/18 0655 11/03/18 0700  BP: 103/63 (!) 96/59    Pulse: 77 77 86 74  Resp: (!) 26 17 18  (!) 24  Temp: 98.6 F (37 C)     TempSrc:      SpO2: 95% 96% 99% 97%  Weight:      Height:        Intake/Output Summary (Last 24 hours) at 11/03/2018 0803 Last data filed at 11/03/2018 0700 Gross per 24 hour  Intake 394.65 ml  Output 450 ml  Net -55.35 ml   Last 3 Weights 11/02/2018 10/31/2018 10/29/2018  Weight (lbs) 149 lb 14.6 oz 150 lb 150 lb  Weight (kg) 68 kg 68.04 kg 68.04 kg      Telemetry    sinus - Personally Reviewed  ECG    SR without acute ST changes - Personally Reviewed  Physical Exam   GEN: No acute distress.   Neck: No JVD Cardiac: RRR, no murmurs, rubs, or gallops.  Respiratory: Clear to auscultation bilaterally. GI: Soft, nontender, non-distended  MS: No edema; No deformity. Neuro:  Nonfocal  Psych: Normal affect   Labs    Chemistry Recent Labs  Lab 10/31/18 0137 11/02/18 2057  NA 134* 129*  K 3.5 3.3*  CL 97* 93*  CO2 25 26  GLUCOSE 131* 154*  BUN 9 13  CREATININE 0.65 0.89  CALCIUM 9.3 9.1  PROT 7.4  --   ALBUMIN  3.9  --   AST 19  --   ALT 15  --   ALKPHOS 75  --   BILITOT 0.6  --   GFRNONAA >60 >60  GFRAA >60 >60  ANIONGAP 12 10     Hematology Recent Labs  Lab 10/31/18 0137 11/02/18 2057  WBC 14.5* 13.7*  RBC 5.86* 5.84*  HGB 16.2* 16.2*  HCT 48.0* 48.4*  MCV 81.9 82.9  MCH 27.6 27.7  MCHC 33.8 33.5  RDW 13.6 13.7  PLT 450* 371    Cardiac Enzymes Recent Labs  Lab 11/02/18 2057  TROPONINI 7.06*   No results for input(s): TROPIPOC in the last 168 hours.   BNPNo results for input(s): BNP, PROBNP in the last 168 hours.   DDimer No results for input(s): DDIMER in the last 168 hours.   Radiology    Dg Chest 2 View  Result Date: 11/02/2018 CLINICAL DATA:  Chest pain 2 days with back spasms. EXAM: CHEST - 2 VIEW COMPARISON:  10/31/2018 FINDINGS: Lungs are adequately inflated without consolidation or effusion. Cardiomediastinal silhouette and remainder of the exam is unchanged. IMPRESSION: No active cardiopulmonary disease. Electronically Signed   By: Marin Olp M.D.  On: 11/02/2018 20:56   Ct Angio Chest Pe W/cm &/or Wo Cm  Result Date: 11/02/2018 CLINICAL DATA:  Chest pain and shortness of breath EXAM: CT ANGIOGRAPHY CHEST WITH CONTRAST TECHNIQUE: Multidetector CT imaging of the chest was performed using the standard protocol during bolus administration of intravenous contrast. Multiplanar CT image reconstructions and MIPs were obtained to evaluate the vascular anatomy. CONTRAST:  67mL OMNIPAQUE IOHEXOL 350 MG/ML SOLN COMPARISON:  CTA chest 01/12/2013 FINDINGS: Cardiovascular: --Pulmonary arteries: Contrast injection is sufficient to demonstrate satisfactory opacification of the pulmonary arteries to the segmental level. There is no pulmonary embolus. The main pulmonary artery is within normal limits for size. --Aorta: Limited opacification of the aorta due to bolus timing optimization for the pulmonary arteries. Conventional 3 vessel aortic branching pattern. The aortic course and  caliber are normal. There is mild aortic atherosclerosis. --Heart: Normal size. No pericardial effusion. Mediastinum/Nodes: No mediastinal, hilar or axillary lymphadenopathy. The visualized thyroid and thoracic esophageal course are unremarkable. Lungs/Pleura: No pulmonary nodules or masses. No pleural effusion or pneumothorax. No focal airspace consolidation. No focal pleural abnormality. Upper Abdomen: Contrast bolus timing is not optimized for evaluation of the abdominal organs. Large left renal cyst is incompletely visualized but measures up to 3.7 cm. There is vicarious excretion of contrast in the gallbladder, likely from the CT of the abdomen and pelvis performed 10/31/2018 Musculoskeletal: No chest wall abnormality. No acute or significant osseous findings. Review of the MIP images confirms the above findings. IMPRESSION: No pulmonary embolus or other acute thoracic abnormality. Electronically Signed   By: Ulyses Jarred M.D.   On: 11/02/2018 21:58    Cardiac Studies   Cath: 2006  RESULTS:  The aortic pressure was 118/67 with a mean of 88 and the left  ventricular pressure was 118/19.   Left main coronary artery:  The left main coronary artery was free of  significant disease.   Left anterior descending:  The left anterior descending gave rise to three  septal perforators and a large and small diagonal branch.  These and the LAD  proper were free of significant disease.   Circumflex artery:  The circumflex artery rise to a large ramus branch and  an AV branch which terminated into two posterolateral branches.  These  vessels were free of significant disease.  The ramus branch filled a vessel  that appeared.  It appeared to line the inferior wall.  It was not clear  whether this was a collateral or an extension of the ramus branch.  There  was no area in the right coronary artery that was identified as being  occluded.  This was also a very small vessel.   Right coronary artery:   The right coronary artery was a small vessel.  It  gave rise to a conus branch, two right ventricular branches and a posterior  descending branch.  These vessels were free of significant disease.   Left ventriculogram:  The left ventriculogram performed in the ROA  projection showed good wall motion with no areas of hypokinesis.  The  estimated ejection fraction was 60%.   CONCLUSION:  Normal coronary angiography and left ventricular function.  Echo: 7/14  Study Conclusions  - Left ventricle: The cavity size was normal. Wall thickness was normal. Systolic function was normal. The estimated ejection fraction was in the range of 55% to 60%. Wall motion was normal; there were no regional wall motion abnormalities. Left ventricular diastolic function parameters were normal. - Aortic valve: There was no  stenosis. - Mitral valve: Trivial regurgitation. - Right ventricle: The cavity size was normal. Systolic function was normal. - Pulmonary arteries: No complete TR doppler jet so unable to estimate PA systolic pressure. - Inferior vena cava: The vessel was normal in size; the respirophasic diameter changes were in the normal range (= 50%); findings are consistent with normal central venous pressure. Impressions:  - Normal study.  Patient Profile     68 y.o. female with PMH of asthma, COPD, DVT, GERD, HTN, HL who presented to [facility] with chest pain and found to have a positive troponin. Transferred to [facility] for further work up.   Assessment & Plan    1. NSTEMI: developed chest pain over 24 hours prior to admission. Recent loss of family member, could be takotsubo etiology as well. Of note was seen by cardiology back in 2006 and underwent cardiac cath with no obstructive CAD. Initial troponin at [facility] was 7. No repeat noted. -- remains on IV nitro and heparin -- trend troponins -- echo is pending -- will need further diagnostic testing but currently under rule out  for Covid-19, therefore would treat medically at this time -- ASA, statin, add low dose BB  2. HL: possible hx? No home medications noted. Placed on high dose statin on admission -- lipids in the am, Hgb A1c pending  3. HTN: Had some low blood pressure readings prior to transfer. Add low dose BB today and follow BP.  4. Dyspnea: known Hx of COPD. Currently undergoing COVID rule out. No fevers, CT chest without concerning findings, no CAD reported. Continues to smoke.   5. Tobacco use: needs cessation education this admission.  Signed, Reino Bellis, NP  11/03/2018, 8:03 AM    For questions or updates, please contact [place] Please consult [email or web address] for contact info under   Patient seen and examined. Agree with assessment and plan.  Ms. Shafer is a 68 year old African American female who presented last evening after developing left-sided chest pain described as tightness as well as a sharp pain.  Troponins were positive suggesting a non-ST segment elevation MI.  She currently is on IV nitroglycerin at 15 mcg and with her mild residual discomfort I have increased this to 20 mcg.  Echo Doppler study has been ordered but not yet done.  Patient recently had a death in the family which may have contributed to her discomfort.  Await echo assessment of LV function.  Presently she denies shortness of breath.  We will add low-dose beta-blocker with metoprolol 12.5 mg twice a day for cardioselectivity in light of underlying COPD.  Await Covid19 testing result which is still pending.  If Covid negative may require definitive evaluation with cardiac catheterization.  Smoking cessation is essential.   Troy Sine, MD, [facility] 11/03/2018 3:38 PM

## 2018-11-03 NOTE — Progress Notes (Signed)
Md notified for transfer orders for low risk rule out corvid.Vanessa Campos

## 2018-11-03 NOTE — Progress Notes (Signed)
Notified Md  Orders needed  for  transfer  " low risk rule out covid. "Saunders Revel T

## 2018-11-03 NOTE — H&P (Signed)
Patient ID: Vanessa Campos MRN: [id number], DOB/AGE: 67-Sep-1952   Admit date: 11/02/2018 Date of Consult: 11/03/2018  Primary Physician: Patient, No Pcp Per Primary Cardiologist: No primary care provider on file.    Past Medical History   Past Medical History:  Diagnosis Date   Asthma    COPD (chronic obstructive pulmonary disease) ([place])    Coronary artery disease    DVT (deep venous thrombosis) ([place]) 2014   provoked, treated with 6 months warfarin   Dyspnea    GERD (gastroesophageal reflux disease)    Hyperlipidemia    Hypertension     Past Surgical History:  Procedure Laterality Date   ABDOMINAL SURGERY     CARPAL TUNNEL RELEASE       Allergies  Allergies  Allergen Reactions   Tramadol     shaking   Vicodin [Hydrocodone-Acetaminophen] Other (See Comments)    Shaking     History of Present Illness    68 year old female with no significant past medical history presents to the outside hospital ED with new onset chest pain for 24 hours.  she lost her knees just over a week ago and shortly after she presented to the local emergency room for diarrhea and abdominal pain.  Symptoms resolved within a day.  Today she reports retrosternal chest pain burning that is always there.  No shortness of breath, PND lower extremity edema.  No dizziness or loss of conscious.  Not taking any medications.  He denies fevers.  Admits to some chills.  She does admit to some mild cough which she had for a week.  She lives with her granddaughter.  No illicit drug use.  Smokes 7 cigarettes a day.  Inpatient Medications     [START ON 11/04/2018] aspirin EC  81 mg Oral Daily   atorvastatin  40 mg Oral q1800   sodium chloride flush  3 mL Intravenous Once    Family History    No family history on file. has no family status information on file.    Social History    Social History   Socioeconomic History   Marital status: Single    Spouse name: Not on file   Number  of children: Not on file   Years of education: Not on file   Highest education level: Not on file  Occupational History   Not on file  Social Needs   Financial resource strain: Not hard at all   Food insecurity:    Worry: Never true    Inability: Never true   Transportation needs:    Medical: No    Non-medical: No  Tobacco Use   Smoking status: Current Every Day Smoker    Packs/day: 1.00    Years: 40.00    Pack years: 40.00    Types: Cigarettes   Smokeless tobacco: Never Used  Substance and Sexual Activity   Alcohol use: No    Alcohol/week: 0.0 standard drinks   Drug use: No   Sexual activity: Not on file  Lifestyle   Physical activity:    Days per week: Not on file    Minutes per session: Not on file   Stress: Not on file  Relationships   Social connections:    Talks on phone: Not on file    Gets together: Not on file    Attends religious service: Not on file    Active member of club or organization: Not on file    Attends meetings of clubs or organizations: Not on  file    Relationship status: Not on file   Intimate partner violence:    Fear of current or ex partner: Not on file    Emotionally abused: Not on file    Physically abused: Not on file    Forced sexual activity: Not on file  Other Topics Concern   Not on file  Social History Narrative   Not on file     Review of Systems    General:  No chills, fever, night sweats or weight changes.  Cardiovascular:  No chest pain, dyspnea on exertion, edema, orthopnea, palpitations, paroxysmal nocturnal dyspnea. Dermatological: No rash, lesions/masses Respiratory: No cough, dyspnea Urologic: No hematuria, dysuria Abdominal:   No nausea, vomiting, diarrhea, bright red blood per rectum, melena, or hematemesis Neurologic:  No visual changes, wkns, changes in mental status. All other systems reviewed and are otherwise negative except as noted above.  Physical Exam    Blood pressure 132/84, pulse  70, temperature 98.9 F (37.2 C), temperature source Oral, resp. rate 19, height 5\' 3"  (1.6 m), weight 68 kg, SpO2 99 %.  General: Pleasant, NAD Psych: Normal affect. Neuro: Alert and oriented X 3. Moves all extremities spontaneously. HEENT: Normal  Neck: Supple without bruits or JVD. Lungs:  Resp regular and unlabored, CTA. Heart: RRR no s3, s4, or murmurs. Abdomen: Soft, non-tender, non-distended, BS + x 4.  Extremities: No clubbing, cyanosis or edema. DP/PT/Radials 2+ and equal bilaterally.  Labs    Troponin (Point of Care Test) No results for input(s): TROPIPOC in the last 72 hours. Recent Labs    11/02/18 2057  TROPONINI 7.06*   Lab Results  Component Value Date   WBC 13.7 (H) 11/02/2018   HGB 16.2 (H) 11/02/2018   HCT 48.4 (H) 11/02/2018   MCV 82.9 11/02/2018   PLT 371 11/02/2018    Recent Labs  Lab 10/31/18 0137 11/02/18 2057  NA 134* 129*  K 3.5 3.3*  CL 97* 93*  CO2 25 26  BUN 9 13  CREATININE 0.65 0.89  CALCIUM 9.3 9.1  PROT 7.4  --   BILITOT 0.6  --   ALKPHOS 75  --   ALT 15  --   AST 19  --   GLUCOSE 131* 154*   No results found for: CHOL, HDL, LDLCALC, TRIG Lab Results  Component Value Date   DDIMER  02/09/2008    <0.22        AT THE INHOUSE ESTABLISHED CUTOFF VALUE OF 0.48 ug/mL FEU, THIS ASSAY HAS BEEN DOCUMENTED IN THE LITERATURE TO HAVE     Radiology Studies    Dg Chest 2 View  Result Date: 11/02/2018 CLINICAL DATA:  Chest pain 2 days with back spasms. EXAM: CHEST - 2 VIEW COMPARISON:  10/31/2018 FINDINGS: Lungs are adequately inflated without consolidation or effusion. Cardiomediastinal silhouette and remainder of the exam is unchanged. IMPRESSION: No active cardiopulmonary disease. Electronically Signed   By: Marin Olp M.D.   On: 11/02/2018 20:56   Dg Chest 2 View  Result Date: 10/31/2018 CLINICAL DATA:  Cough. Abdominal pain. History of COPD, hypertension and hyperlipidemia. EXAM: CHEST - 2 VIEW COMPARISON:  Chest radiograph  March 03, 2015 FINDINGS: Cardiomediastinal silhouette is normal. No pleural effusions or focal consolidations. Trachea projects midline and there is no pneumothorax. Soft tissue planes and included osseous structures are non-suspicious. IMPRESSION: No active cardiopulmonary process. Electronically Signed   By: Elon Alas M.D.   On: 10/31/2018 03:41   Dg Shoulder Right  Result Date:  10/19/2018 CLINICAL DATA:  Right shoulder pain for 3 weeks.  No known injury. EXAM: RIGHT SHOULDER - 2+ VIEW COMPARISON:  Plain films right shoulder 06/27/2007. FINDINGS: There is no acute bony or joint abnormality. Mild acromioclavicular and glenohumeral degenerative disease noted. Imaged right lung and ribs appear normal. IMPRESSION: No acute abnormality. Mild glenohumeral and acromioclavicular degenerative change. Electronically Signed   By: Inge Rise M.D.   On: 10/19/2018 15:56   Ct Angio Chest Pe W/cm &/or Wo Cm  Result Date: 11/02/2018 CLINICAL DATA:  Chest pain and shortness of breath EXAM: CT ANGIOGRAPHY CHEST WITH CONTRAST TECHNIQUE: Multidetector CT imaging of the chest was performed using the standard protocol during bolus administration of intravenous contrast. Multiplanar CT image reconstructions and MIPs were obtained to evaluate the vascular anatomy. CONTRAST:  32mL OMNIPAQUE IOHEXOL 350 MG/ML SOLN COMPARISON:  CTA chest 01/12/2013 FINDINGS: Cardiovascular: --Pulmonary arteries: Contrast injection is sufficient to demonstrate satisfactory opacification of the pulmonary arteries to the segmental level. There is no pulmonary embolus. The main pulmonary artery is within normal limits for size. --Aorta: Limited opacification of the aorta due to bolus timing optimization for the pulmonary arteries. Conventional 3 vessel aortic branching pattern. The aortic course and caliber are normal. There is mild aortic atherosclerosis. --Heart: Normal size. No pericardial effusion. Mediastinum/Nodes: No mediastinal,  hilar or axillary lymphadenopathy. The visualized thyroid and thoracic esophageal course are unremarkable. Lungs/Pleura: No pulmonary nodules or masses. No pleural effusion or pneumothorax. No focal airspace consolidation. No focal pleural abnormality. Upper Abdomen: Contrast bolus timing is not optimized for evaluation of the abdominal organs. Large left renal cyst is incompletely visualized but measures up to 3.7 cm. There is vicarious excretion of contrast in the gallbladder, likely from the CT of the abdomen and pelvis performed 10/31/2018 Musculoskeletal: No chest wall abnormality. No acute or significant osseous findings. Review of the MIP images confirms the above findings. IMPRESSION: No pulmonary embolus or other acute thoracic abnormality. Electronically Signed   By: Ulyses Jarred M.D.   On: 11/02/2018 21:58   Ct Abdomen Pelvis W Contrast  Result Date: 10/31/2018 CLINICAL DATA:  Mid abdominal pain for 2 days. History of abdominal surgery. EXAM: CT ABDOMEN AND PELVIS WITH CONTRAST TECHNIQUE: Multidetector CT imaging of the abdomen and pelvis was performed using the standard protocol following bolus administration of intravenous contrast. CONTRAST:  70mL ISOVUE-300 IOPAMIDOL (ISOVUE-300) INJECTION 61%, 118mL OMNIPAQUE IOHEXOL 300 MG/ML SOLN COMPARISON:  CT abdomen and pelvis January 16, 2010 FINDINGS: LOWER CHEST: RIGHT lung base scarring. Included heart size is normal. No pericardial effusion. HEPATOBILIARY: 14 mm homogeneously hypodense benign-appearing cyst LEFT lobe of the liver with a few smaller scattered hepatic cysts. Normal gallbladder. PANCREAS: Normal. SPLEEN: Normal. ADRENALS/URINARY TRACT: Kidneys are orthotopic, demonstrating symmetric enhancement. No nephrolithiasis, hydronephrosis or solid renal masses. 3.6 cm homogeneously hypodense benign-appearing cyst interpolar LEFT kidney. Seventeen and 19 mm benign-appearing RIGHT interpolar cyst. Additional too small to characterize hypodensities  bilateral kidneys. The unopacified ureters are normal in course and caliber. Delayed imaging through the kidneys demonstrates symmetric prompt contrast excretion within the proximal urinary collecting system. Urinary bladder is decompressed and unremarkable. Normal adrenal glands. STOMACH/BOWEL: The stomach, small and large bowel are normal in course and caliber without inflammatory changes. Normal appendix. VASCULAR/LYMPHATIC: Aortoiliac vessels are normal in course and caliber. Moderate calcific atherosclerosis. No lymphadenopathy by CT size criteria. 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 2.2 cm LEFT Bartholin versus Gartner cyst. OTHER: No intraperitoneal free fluid or free air. MUSCULOSKELETAL: Nonacute. Decreased  size of small to moderate wide necked residual anterior abdominal wall fat containing ventral hernias. A knuckle of bowel at the hernia wall does not extend into the sac. IMPRESSION: 1. No acute intra-abdominal or pelvic process. 2. Decreased size of small to moderate fat containing ventral hernias. 3.  Aortic Atherosclerosis (ICD10-I70.0). Electronically Signed   By: Elon Alas M.D.   On: 10/31/2018 03:49   US Venous Img Lower Unilateral Right  Result Date: 10/19/2018 CLINICAL DATA:  Right-sided knee pain for several weeks EXAM: RIGHT LOWER EXTREMITY VENOUS DOPPLER ULTRASOUND TECHNIQUE: Gray-scale sonography with graded compression, as well as color Doppler and duplex ultrasound were performed to evaluate the lower extremity deep venous systems from the level of the common femoral vein and including the common femoral, femoral, profunda femoral, popliteal and calf veins including the posterior tibial, peroneal and gastrocnemius veins when visible. The superficial great saphenous vein was also interrogated. Spectral Doppler was utilized to evaluate flow at rest and with distal augmentation maneuvers in the common femoral, femoral and popliteal veins. COMPARISON:   None. FINDINGS: Contralateral Common Femoral Vein: Respiratory phasicity is normal and symmetric with the symptomatic side. No evidence of thrombus. Normal compressibility. Common Femoral Vein: No evidence of thrombus. Normal compressibility, respiratory phasicity and response to augmentation. Saphenofemoral Junction: No evidence of thrombus. Normal compressibility and flow on color Doppler imaging. Profunda Femoral Vein: No evidence of thrombus. Normal compressibility and flow on color Doppler imaging. Femoral Vein: No evidence of thrombus. Normal compressibility, respiratory phasicity and response to augmentation. Popliteal Vein: Some decreased flow is noted within the popliteal vein although normal compressibility is seen and no thrombus is noted. Calf Veins: No evidence of thrombus. Normal compressibility and flow on color Doppler imaging. Superficial Great Saphenous Vein: No evidence of thrombus. Normal compressibility. Venous Reflux:  None. Other Findings:  None. IMPRESSION: No evidence of deep venous thrombosis. Mild slow flow is noted within the popliteal vein. Electronically Signed   By: Inez Catalina M.D.   On: 10/19/2018 16:28    ECG & Cardiac Imaging    Sinus rhythm without ST elevations or ST depressions suggestive of ischemia.  Assessment & Plan    68 year old female with an end STEMI.  A day long history of chest pain was preceded by the death of her niece a week ago with some GI related symptoms shortly after.  Has half cough, chills and a mild cough as well.  Pressure diagnosis includes Takotsubo, NSTEMI and coronavirus.   Plan: -Heparin drip, aspirin ready given. -Nitroglycerin drip -Covid -Left heart cath in the morning once covid negative. If positive possibly all related to   Signed, Cristina Gong, MD 11/03/2018, 1:29 AM  For questions or updates, please contact   Please consult [email or web address] for contact info under Cardiology/STEMI.

## 2018-11-03 NOTE — Progress Notes (Addendum)
ANTICOAGULATION CONSULT NOTE -follow up  Pharmacy Consult for heparin Indication: STMEI, chest pain  (CT angio was negative for PE)   Allergies  Allergen Reactions  . Tramadol     shaking  . Vicodin [Hydrocodone-Acetaminophen] Other (See Comments)    Shaking     Patient Measurements: Height: 5\' 3"  (160 cm) Weight: 149 lb 14.6 oz (68 kg) IBW/kg (Calculated) : 52.4 Heparin Dosing Weight: 66.2 kg  Vital Signs: Temp: 97.7 F (36.5 C) (04/06 1242) Temp Source: Oral (04/06 1242) BP: 115/84 (04/06 1242) Pulse Rate: 75 (04/06 1242)  Labs: Recent Labs    11/02/18 2057 11/03/18 0723 11/03/18 0831 11/03/18 1413  HGB 16.2* 15.4*  --   --   HCT 48.4* 44.6  --   --   PLT 371 333  --   --   HEPARINUNFRC  --  0.44  --  0.19*  CREATININE 0.89 0.76  --   --   TROPONINI 7.06*  --  7.42*  --     Estimated Creatinine Clearance: 64 mL/min (by C-G formula based on SCr of 0.76 mg/dL).   Medical History: Past Medical History:  Diagnosis Date  . Asthma   . COPD (chronic obstructive pulmonary disease) ([place])   . Coronary artery disease   . DVT (deep venous thrombosis) ([place]) 2014   provoked, treated with 6 months warfarin  . Dyspnea   . GERD (gastroesophageal reflux disease)   . Hyperlipidemia   . Hypertension     Medications:  Scheduled:  . [START ON 11/04/2018] aspirin EC  81 mg Oral Daily  . atorvastatin  40 mg Oral q1800  . [START ON 11/04/2018] Influenza vac split quadrivalent PF  0.5 mL Intramuscular Tomorrow-1000  . [START ON 11/04/2018] pneumococcal 23 valent vaccine  0.5 mL Intramuscular Tomorrow-1000  . sodium chloride flush  3 mL Intravenous Once    Assessment: 64 yof presenting with chest/back pain and SOB. Has hx of DVT in 2014- previously treated with warfarin. Now no AC PTA.  Troponin 7.06 on admit 11/02/18.   Heparin infusion started 11/02/18 PM.   Heparin level was therapeutic at 0.44 this AM on heparin rate 1050 units/hr.  Next 6 hr heparin level has decreased to  0.19 on 1050 ut/hr.  Heparin infusion continues for  STEMI, CP. Cardiology is planning left heart cath in morning once Covid negative.  CT angio neg for PE  Nurse reports heparin infusion pump alarming frequently, thus RN had new IV placed in forearm.  Lower heparin level likely due to inadequate IV line acces for heparin.  New peripheral IV placed less than 2 hours prior to this heparin level drawn.  Will increase heparin to target therapetuic HL 0.3-0.7 units/ml.   Hgb 15.4, plt 333. Troponin 7.06>7.42>6.35.  No s/sx of bleeding.  CT angio 11/02/18: negative for PE or other acute throacic abnormality.   Goal of Therapy:  Heparin level 0.3-0.7 units/ml Monitor platelets by anticoagulation protocol: Yes   Plan:  Give heparin bolus 2000 units IV x1  Increase  heparin drip to 150 units/hr Heparin level in ~6 hrs , try to scheduled with next troponin due.  Daily HL, CBC every AM Monitor for bleeding signs and symptoms.   Nicole Cella, RPh Clinical Pharmacist Pager: [phone] Phone: [phone] Please check AMION for all [place] phone numbers After 10:00 PM, call [place] [phone]  11/03/2018,3:05 PM

## 2018-11-03 NOTE — Progress Notes (Signed)
Dr at bedside. Symptoms reviewed.  Questioned rule out Coronavirus.  Undecided at this time. Will continue to monitor.  Saunders Revel T

## 2018-11-03 NOTE — Progress Notes (Signed)
Transferred pt to 2W19 via bed.  Pt had face mask on. Bed wiped with purple top wipes   Daughter was notified of new location.  Belongings transferred and place in new room.  Saunders Revel T

## 2018-11-03 NOTE — Progress Notes (Signed)
ANTICOAGULATION CONSULT NOTE -follow up  Pharmacy Consult for heparin Indication: pulmonary embolus  Allergies  Allergen Reactions  . Tramadol     shaking  . Vicodin [Hydrocodone-Acetaminophen] Other (See Comments)    Shaking     Patient Measurements: Height: 5\' 3"  (160 cm) Weight: 149 lb 14.6 oz (68 kg) IBW/kg (Calculated) : 52.4 Heparin Dosing Weight: 66.2 kg  Vital Signs: Temp: 98.6 F (37 C) (04/06 0500) Temp Source: Oral (04/06 0100) BP: 114/79 (04/06 0808) Pulse Rate: 75 (04/06 0808)  Labs: Recent Labs    11/02/18 2057 11/03/18 0723  HGB 16.2* 15.4*  HCT 48.4* 44.6  PLT 371 333  HEPARINUNFRC  --  0.44  CREATININE 0.89 0.76  TROPONINI 7.06*  --     Estimated Creatinine Clearance: 64 mL/min (by C-G formula based on SCr of 0.76 mg/dL).   Medical History: Past Medical History:  Diagnosis Date  . Asthma   . COPD (chronic obstructive pulmonary disease) ([place])   . Coronary artery disease   . DVT (deep [place] thrombosis) ([place]) 2014   provoked, treated with 6 months warfarin  . Dyspnea   . GERD (gastroesophageal reflux disease)   . Hyperlipidemia   . Hypertension     Medications:  Scheduled:  . [START ON 11/04/2018] aspirin EC  81 mg Oral Daily  . atorvastatin  40 mg Oral q1800  . [START ON 11/04/2018] Influenza vac split quadrivalent PF  0.5 mL Intramuscular Tomorrow-1000  . [START ON 11/04/2018] pneumococcal 23 valent vaccine  0.5 mL Intramuscular Tomorrow-1000  . sodium chloride flush  3 mL Intravenous Once    Assessment: 57 yof presenting with chest/back pain and SOB. Has hx of DVT in 2014- previously treated with warfarin. Now no AC PTA.  Troponin 7.06 on admit 11/02/18.   Heparin infusion started 11/02/18 PM.   Heparin level this morning is therapeutic at 0.44 on heparin rate 1050 units/hr.  Hgb 15.4, plt 333. Troponin 7.06. No s/sx of bleeding.  CT angio 11/02/18: negative for PE or other acute throacic abnormality.   Goal of Therapy:  Heparin level  0.3-0.7 units/ml Monitor platelets by anticoagulation protocol: Yes   Plan:  Continue heparin drip at current rate 1050 units/hr Heparin level at 1430 to confirm therapeutic, scheduled with next troponin due.  Daily HL, CBC every AM Monitor for bleeding signs and symptoms.   Nicole Cella, RPh Clinical Pharmacist Pager: [phone] Phone: [phone] Please check AMION for all [place] phone numbers After 10:00 PM, call [place] [phone]  11/03/2018,9:06 AM

## 2018-11-03 NOTE — Progress Notes (Signed)
CRITICAL VALUE ALERT  Critical Value:  Troponin 1 (7.42)  Date & Time Notied: 11/03/2018 @ 1013  Provider Notified: Harlan Stains (NP) via Amion at 10:30 am  Orders Received/Actions taken: no orders given

## 2018-11-03 NOTE — Progress Notes (Signed)
Place pt on droplet/ contact and informed daughter York Cerise.  AC made aware.  Saunders Revel T

## 2018-11-04 ENCOUNTER — Inpatient Hospital Stay (HOSPITAL_COMMUNITY): Payer: Medicare Other

## 2018-11-04 DIAGNOSIS — R9431 Abnormal electrocardiogram [ECG] [EKG]: Secondary | ICD-10-CM

## 2018-11-04 LAB — CBC
HCT: 42 % (ref 36.0–46.0)
Hemoglobin: 14.4 g/dL (ref 12.0–15.0)
MCH: 28.2 pg (ref 26.0–34.0)
MCHC: 34.3 g/dL (ref 30.0–36.0)
MCV: 82.2 fL (ref 80.0–100.0)
Platelets: 335 10*3/uL (ref 150–400)
RBC: 5.11 MIL/uL (ref 3.87–5.11)
RDW: 13.6 % (ref 11.5–15.5)
WBC: 13.5 10*3/uL — ABNORMAL HIGH (ref 4.0–10.5)
nRBC: 0 % (ref 0.0–0.2)

## 2018-11-04 LAB — ECHOCARDIOGRAM COMPLETE
Height: 63 in
Weight: 2398.6 oz

## 2018-11-04 LAB — HEPARIN LEVEL (UNFRACTIONATED): Heparin Unfractionated: 0.45 IU/mL (ref 0.30–0.70)

## 2018-11-04 LAB — LIPID PANEL
Cholesterol: 215 mg/dL — ABNORMAL HIGH (ref 0–200)
HDL: 56 mg/dL (ref >40)
LDL Cholesterol: 142 mg/dL — ABNORMAL HIGH (ref 0–99)
Total CHOL/HDL Ratio: 3.8 RATIO
Triglycerides: 83 mg/dL (ref <150)
VLDL: 17 mg/dL (ref 0–40)

## 2018-11-04 MED ORDER — METOPROLOL TARTRATE 12.5 MG HALF TABLET
12.5000 mg | ORAL_TABLET | Freq: Two times a day (BID) | ORAL | Status: DC
  Filled 2018-11-04: qty 1

## 2018-11-04 NOTE — Progress Notes (Signed)
  Echocardiogram 2D Echocardiogram has been performed.  Vanessa Campos 11/04/2018, 11:46 AM

## 2018-11-04 NOTE — Progress Notes (Signed)
Assumed care from [place], [place]. Patient resting.

## 2018-11-04 NOTE — Progress Notes (Signed)
1115: Nitro gtt titrated off. BP mid to high 90s. Held AM metoprolol. Unable to reach on call MD to update

## 2018-11-04 NOTE — Progress Notes (Signed)
ANTICOAGULATION CONSULT NOTE -follow up  Pharmacy Consult for heparin Indication: ACS  Allergies  Allergen Reactions  . Tramadol     shaking  . Vicodin [Hydrocodone-Acetaminophen] Other (See Comments)    Shaking     Patient Measurements: Height: 5\' 3"  (160 cm) Weight: 149 lb 14.6 oz (68 kg) IBW/kg (Calculated) : 52.4 Heparin Dosing Weight: 66.2 kg  Vital Signs: Temp: 98.9 F (37.2 C) (04/07 0804) Temp Source: Oral (04/07 0804) BP: 95/64 (04/07 0905) Pulse Rate: 94 (04/07 0905)  Labs: Recent Labs    11/02/18 2057  11/03/18 0723 11/03/18 0831 11/03/18 1413 11/03/18 2110 11/04/18 0434  HGB 16.2*  --  15.4*  --   --   --  14.4  HCT 48.4*  --  44.6  --   --   --  42.0  PLT 371  --  333  --   --   --  335  LABPROT  --   --   --   --  13.5  --   --   INR  --   --   --   --  1.0  --   --   HEPARINUNFRC  --    < > 0.44  --  0.19* 0.63 0.45  CREATININE 0.89  --  0.76  --   --   --   --   TROPONINI 7.06*  --   --  7.42* 6.35* 6.66*  --    < > = values in this interval not displayed.    Estimated Creatinine Clearance: 64 mL/min (by C-G formula based on SCr of 0.76 mg/dL).  Assessment: CC/HPI: 68 yo f presenting with CP  PMH:  DVT 2014, treated w/ 6 mon warf,  asthma, COPD, dyspnea, HTN, HLD, GERD  Anticoag: hep gtt for r/o ACS - no current AC PTA; hx DVT in 2014 (on warfarin for 6 months); hep lvl within goal  ID: low risk, but concern for covid. WBC 13.5, AF  4/6 covid ip  CV:  TSH wnl,  asa 81, lipitor 40mg    Pulm: covid 19 ip , no inhalation meds ordered H/o COPD, asthma CTA neg for PE  Heme/Onc: H&H 14.4/42, Plt 335  Goal of Therapy:  Heparin level 0.3-0.7 units/ml Monitor platelets by anticoagulation protocol: Yes   Plan:  Continue heparin infusion at 1150 units/hr Daily HL CBC Likely cath pending COVID results  Levester Fresh, PharmD, BCPS, BCCCP Clinical Pharmacist [phone]  Please check AMION for all [place] numbers  11/04/2018 9:46  AM

## 2018-11-04 NOTE — Progress Notes (Addendum)
Progress Note  Patient Name: Vanessa Campos Date of Encounter: 11/04/2018  Primary Cardiologist: No primary care provider on file.   Subjective   She complains of mild residual left-sided chest pain which at times is tightness and other times sharp.  Inpatient Medications    Scheduled Meds: . aspirin EC  81 mg Oral Daily  . atorvastatin  40 mg Oral q1800  . Influenza vac split quadrivalent PF  0.5 mL Intramuscular Tomorrow-1000  . metoprolol tartrate  12.5 mg Oral BID  . pneumococcal 23 valent vaccine  0.5 mL Intramuscular Tomorrow-1000  . sodium chloride flush  3 mL Intravenous Once   Continuous Infusions: . heparin 1,150 Units/hr (11/04/18 0905)  . nitroGLYCERIN 20 mcg/min (11/04/18 0905)   PRN Meds: nitroGLYCERIN, ondansetron (ZOFRAN) IV   Vital Signs    Vitals:   11/04/18 0437 11/04/18 0506 11/04/18 0537 11/04/18 0804  BP: 106/80 106/82 105/78 101/73  Pulse: 73  74 81  Resp: 19  (!) 22 (!) 26  Temp:    98.9 F (37.2 C)  TempSrc:    Oral  SpO2:   95% 92%  Weight:      Height:        Intake/Output Summary (Last 24 hours) at 11/04/2018 0910 Last data filed at 11/04/2018 3662 Gross per 24 hour  Intake 263.95 ml  Output -  Net 263.95 ml   Last 3 Weights 11/02/2018 10/31/2018 10/29/2018  Weight (lbs) 149 lb 14.6 oz 150 lb 150 lb  Weight (kg) 68 kg 68.04 kg 68.04 kg      Telemetry    SR, no sig ectopy - Personally Reviewed  ECG    SR, HR 96, IVCD is old, QRS 81 ms- Personally Reviewed  Physical Exam   General: Well developed, well nourished, female in no acute distress Head: Eyes PERRLA, No xanthomas.   Normocephalic and atraumatic Lungs: Clear bilaterally to auscultation. Heart: HRRR S1 S2, without MRG.  Pulses are 2+ & equal. No JVD. Abdomen: Bowel sounds are present, abdomen soft and non-tender without masses or  hernias noted. Msk: Normal strength and tone for age. Extremities: No clubbing, cyanosis or edema.    Skin:  No rashes or lesions noted.  Neuro: Alert and oriented X 3. Psych:  Good affect, responds appropriately   Labs    Chemistry Recent Labs  Lab 10/31/18 0137 11/02/18 2057 11/03/18 0723  NA 134* 129* 131*  K 3.5 3.3* 3.5  CL 97* 93* 95*  CO2 25 26 25   GLUCOSE 131* 154* 116*  BUN 9 13 9   CREATININE 0.65 0.89 0.76  CALCIUM 9.3 9.1 8.7*  PROT 7.4  --   --   ALBUMIN 3.9  --   --   AST 19  --   --   ALT 15  --   --   ALKPHOS 75  --   --   BILITOT 0.6  --   --   GFRNONAA >60 >60 >60  GFRAA >60 >60 >60  ANIONGAP 12 10 11      Hematology Recent Labs  Lab 11/02/18 2057 11/03/18 0723 11/04/18 0434  WBC 13.7* 16.1* 13.5*  RBC 5.84* 5.44* 5.11  HGB 16.2* 15.4* 14.4  HCT 48.4* 44.6 42.0  MCV 82.9 82.0 82.2  MCH 27.7 28.3 28.2  MCHC 33.5 34.5 34.3  RDW 13.7 13.6 13.6  PLT 371 333 335    Cardiac Enzymes Recent Labs  Lab 11/02/18 2057 11/03/18 0831 11/03/18 1413 11/03/18 2110  TROPONINI 7.06* 7.42*  6.35* 6.66*   Lab Results  Component Value Date   CHOL 215 (H) 11/04/2018   HDL 56 11/04/2018   LDLCALC 142 (H) 11/04/2018   TRIG 83 11/04/2018   CHOLHDL 3.8 11/04/2018    Radiology    Dg Chest 2 View  Result Date: 11/02/2018 CLINICAL DATA:  Chest pain 2 days with back spasms. EXAM: CHEST - 2 VIEW COMPARISON:  10/31/2018 FINDINGS: Lungs are adequately inflated without consolidation or effusion. Cardiomediastinal silhouette and remainder of the exam is unchanged. IMPRESSION: No active cardiopulmonary disease. Electronically Signed   By: Marin Olp M.D.   On: 11/02/2018 20:56   Ct Angio Chest Pe W/cm &/or Wo Cm  Result Date: 11/02/2018 CLINICAL DATA:  Chest pain and shortness of breath EXAM: CT ANGIOGRAPHY CHEST WITH CONTRAST TECHNIQUE: Multidetector CT imaging of the chest was performed using the standard protocol during bolus administration of intravenous contrast. Multiplanar CT image reconstructions and MIPs were obtained to evaluate the vascular anatomy. CONTRAST:  78mL OMNIPAQUE IOHEXOL 350  MG/ML SOLN COMPARISON:  CTA chest 01/12/2013 FINDINGS: Cardiovascular: --Pulmonary arteries: Contrast injection is sufficient to demonstrate satisfactory opacification of the pulmonary arteries to the segmental level. There is no pulmonary embolus. The main pulmonary artery is within normal limits for size. --Aorta: Limited opacification of the aorta due to bolus timing optimization for the pulmonary arteries. Conventional 3 vessel aortic branching pattern. The aortic course and caliber are normal. There is mild aortic atherosclerosis. --Heart: Normal size. No pericardial effusion. Mediastinum/Nodes: No mediastinal, hilar or axillary lymphadenopathy. The visualized thyroid and thoracic esophageal course are unremarkable. Lungs/Pleura: No pulmonary nodules or masses. No pleural effusion or pneumothorax. No focal airspace consolidation. No focal pleural abnormality. Upper Abdomen: Contrast bolus timing is not optimized for evaluation of the abdominal organs. Large left renal cyst is incompletely visualized but measures up to 3.7 cm. There is vicarious excretion of contrast in the gallbladder, likely from the CT of the abdomen and pelvis performed 10/31/2018 Musculoskeletal: No chest wall abnormality. No acute or significant osseous findings. Review of the MIP images confirms the above findings. IMPRESSION: No pulmonary embolus or other acute thoracic abnormality. Electronically Signed   By: Ulyses Jarred M.D.   On: 11/02/2018 21:58    Cardiac Studies   ECHO: 2020 - ORDERED  Cath: 2006  RESULTS:  The aortic pressure was 118/67 with a mean of 88 and the left  ventricular pressure was 118/19.   Left main coronary artery:  The left main coronary artery was free of  significant disease.   Left anterior descending:  The left anterior descending gave rise to three  septal perforators and a large and small diagonal branch.  These and the LAD  proper were free of significant disease.   Circumflex artery:   The circumflex artery rise to a large ramus branch and  an AV branch which terminated into two posterolateral branches.  These  vessels were free of significant disease.  The ramus branch filled a vessel  that appeared.  It appeared to line the inferior wall.  It was not clear  whether this was a collateral or an extension of the ramus branch.  There  was no area in the right coronary artery that was identified as being  occluded.  This was also a very small vessel.   Right coronary artery:  The right coronary artery was a small vessel.  It  gave rise to a conus branch, two right ventricular branches and a posterior  descending branch.  These vessels were free of significant disease.   Left ventriculogram:  The left ventriculogram performed in the ROA  projection showed good wall motion with no areas of hypokinesis.  The  estimated ejection fraction was 60%.   CONCLUSION:  Normal coronary angiography and left ventricular function.  Echo: 7/14  Study Conclusions  - Left ventricle: The cavity size was normal. Wall thickness was normal. Systolic function was normal. The estimated ejection fraction was in the range of 55% to 60%. Wall motion was normal; there were no regional wall motion abnormalities. Left ventricular diastolic function parameters were normal. - Aortic valve: There was no stenosis. - Mitral valve: Trivial regurgitation. - Right ventricle: The cavity size was normal. Systolic function was normal. - Pulmonary arteries: No complete TR doppler jet so unable to estimate PA systolic pressure. - Inferior vena cava: The vessel was normal in size; the respirophasic diameter changes were in the normal range (= 50%); findings are consistent with normal central venous pressure. Impressions:  - Normal study.  Patient Profile     68 y.o. female with PMH of asthma, COPD, DVT, GERD, HTN, HL who presented to [facility] with chest pain and found to have a  positive troponin. Transferred to [facility] for further work up.   Assessment & Plan    1. NSTEMI:  - Peak troponin 7.42 - on IV nitro, BB, high-dose statin, heparin - f/u on echo when completed - recent emotional stress over death of family member>>?Takotsubo - cath 2006 w/ no obs dz - once COVID test completed, decide on further eval  2. HL:  - no rx pta.>>high-dose statin - see lipid profile above, goal LDL < 70  3. HTN:  - SBP 90s at one point, but improved.  - metoprolol held this am, SBP 101. Will change parameters since dose is so low.   4. Dyspnea:  - known COPD, COVID pending - no fevers, CXR ok  5. Tobacco use: cessation advised  Signed,  Rosaria Ferries, PA-C  11/04/2018, 9:10 AM    For questions or updates, please contact [place] Please consult [email or web address] for contact info under    Patient seen and examined. Agree with assessment and plan.  COVID-19 panel still pending.  Chest pain has resolved.  No shortness of breath.  Blood pressure remains low, pulse around 80.  Echo Doppler study from today shows an EF of 55 to 60% with normal LV chamber size.  There is mild increased wall thickness.  There is grade 1 diastolic dysfunction.  There is a suggestion of akinesis at the LV apex.  Mild aortic sclerosis.  ECG normal sinus rhythm at 98 bpm with mild RV conduction delay and early transition.   Troy Sine, MD, [facility] 11/04/2018 4:03 PM

## 2018-11-04 NOTE — Plan of Care (Signed)
No variances this shift 

## 2018-11-05 ENCOUNTER — Inpatient Hospital Stay (HOSPITAL_COMMUNITY)
Admission: EM | Disposition: A | Discharge status: Home / Self Care | Payer: Self-pay | Source: Home / Self Care | Attending: Internal Medicine

## 2018-11-05 DIAGNOSIS — J449 Chronic obstructive pulmonary disease, unspecified: Secondary | ICD-10-CM

## 2018-11-05 DIAGNOSIS — I2510 Atherosclerotic heart disease of native coronary artery without angina pectoris: Secondary | ICD-10-CM

## 2018-11-05 HISTORY — PX: LEFT HEART CATH AND CORONARY ANGIOGRAPHY: CATH118249

## 2018-11-05 LAB — CBC
HCT: 43.9 % (ref 36.0–46.0)
Hemoglobin: 14.7 g/dL (ref 12.0–15.0)
MCH: 27.5 pg (ref 26.0–34.0)
MCHC: 33.5 g/dL (ref 30.0–36.0)
MCV: 82.1 fL (ref 80.0–100.0)
Platelets: 351 10*3/uL (ref 150–400)
RBC: 5.35 MIL/uL — ABNORMAL HIGH (ref 3.87–5.11)
RDW: 13.4 % (ref 11.5–15.5)
WBC: 11.8 10*3/uL — ABNORMAL HIGH (ref 4.0–10.5)
nRBC: 0 % (ref 0.0–0.2)

## 2018-11-05 LAB — BASIC METABOLIC PANEL
Anion gap: 13 (ref 5–15)
BUN: 10 mg/dL (ref 8–23)
CO2: 21 mmol/L — ABNORMAL LOW (ref 22–32)
Calcium: 8.9 mg/dL (ref 8.9–10.3)
Chloride: 99 mmol/L (ref 98–111)
Creatinine, Ser: 0.8 mg/dL (ref 0.44–1.00)
GFR calc Af Amer: >60 mL/min (ref >60)
GFR calc non Af Amer: >60 mL/min (ref >60)
Glucose, Bld: 110 mg/dL — ABNORMAL HIGH (ref 70–99)
Potassium: 3.6 mmol/L (ref 3.5–5.1)
Sodium: 133 mmol/L — ABNORMAL LOW (ref 135–145)

## 2018-11-05 LAB — NOVEL CORONAVIRUS, NAA (HOSP ORDER, SEND-OUT TO REF LAB; TAT 18-24 HRS): SARS-CoV-2, NAA: NOT DETECTED

## 2018-11-05 LAB — HEPARIN LEVEL (UNFRACTIONATED): Heparin Unfractionated: 0.37 IU/mL (ref 0.30–0.70)

## 2018-11-05 SURGERY — LEFT HEART CATH AND CORONARY ANGIOGRAPHY
Anesthesia: LOCAL

## 2018-11-05 MED ORDER — SODIUM CHLORIDE 0.9% FLUSH: 3.0000 mL | Freq: Two times a day (BID) | INTRAVENOUS | Status: DC

## 2018-11-05 MED ORDER — SODIUM CHLORIDE 0.9% FLUSH: 3.0000 mL | INTRAVENOUS | Status: DC | PRN

## 2018-11-05 MED ORDER — MIDAZOLAM HCL 2 MG/2ML IJ SOLN
INTRAMUSCULAR | Status: AC
  Filled 2018-11-05: qty 2

## 2018-11-05 MED ORDER — VERAPAMIL HCL 2.5 MG/ML IV SOLN
INTRAVENOUS | Status: DC | PRN
  Administered 2018-11-05: 5 mL via INTRA_ARTERIAL

## 2018-11-05 MED ORDER — HEPARIN (PORCINE) IN NACL 1000-0.9 UT/500ML-% IV SOLN
INTRAVENOUS | Status: DC | PRN
  Administered 2018-11-05: 500 mL

## 2018-11-05 MED ORDER — HEPARIN (PORCINE) IN NACL 1000-0.9 UT/500ML-% IV SOLN
INTRAVENOUS | Status: AC
  Filled 2018-11-05: qty 500

## 2018-11-05 MED ORDER — LIDOCAINE HCL (PF) 1 % IJ SOLN
INTRAMUSCULAR | Status: AC
  Filled 2018-11-05: qty 30

## 2018-11-05 MED ORDER — CLOPIDOGREL BISULFATE 75 MG PO TABS
75.0000 mg | ORAL_TABLET | Freq: Every day | ORAL | Status: DC
  Administered 2018-11-06 – 2018-11-08 (×3): 75 mg via ORAL
  Filled 2018-11-05 (×3): qty 1

## 2018-11-05 MED ORDER — IOHEXOL 350 MG/ML SOLN
INTRAVENOUS | Status: DC | PRN
  Administered 2018-11-05: 14:00:00 40 mL via INTRAVENOUS

## 2018-11-05 MED ORDER — SODIUM CHLORIDE 0.9 % IV SOLN
INTRAVENOUS | Status: AC
  Administered 2018-11-05: 18:00:00 via INTRAVENOUS

## 2018-11-05 MED ORDER — LABETALOL HCL 5 MG/ML IV SOLN: 10.0000 mg | INTRAVENOUS | Status: AC | PRN

## 2018-11-05 MED ORDER — CLOPIDOGREL BISULFATE 75 MG PO TABS
300.0000 mg | ORAL_TABLET | Freq: Once | ORAL | Status: AC
  Administered 2018-11-05: 300 mg via ORAL
  Filled 2018-11-05: qty 4

## 2018-11-05 MED ORDER — VERAPAMIL HCL 2.5 MG/ML IV SOLN
INTRAVENOUS | Status: AC
  Filled 2018-11-05: qty 2

## 2018-11-05 MED ORDER — SODIUM CHLORIDE 0.9 % IV SOLN
INTRAVENOUS | Status: AC | PRN
  Administered 2018-11-05: 500 mL via INTRAVENOUS

## 2018-11-05 MED ORDER — MIDAZOLAM HCL 2 MG/2ML IJ SOLN
INTRAMUSCULAR | Status: DC | PRN
  Administered 2018-11-05: 1 mg via INTRAVENOUS

## 2018-11-05 MED ORDER — SODIUM CHLORIDE 0.9 % IV SOLN: 250.0000 mL | INTRAVENOUS | Status: DC | PRN

## 2018-11-05 MED ORDER — HEPARIN SODIUM (PORCINE) 1000 UNIT/ML IJ SOLN
INTRAMUSCULAR | Status: DC | PRN
  Administered 2018-11-05: 3000 [IU] via INTRAVENOUS

## 2018-11-05 MED ORDER — SODIUM CHLORIDE 0.9 % WEIGHT BASED INFUSION
3.0000 mL/kg/h | INTRAVENOUS | Status: DC
  Administered 2018-11-05: 12:00:00 3 mL/kg/h via INTRAVENOUS

## 2018-11-05 MED ORDER — LIDOCAINE HCL (PF) 1 % IJ SOLN
INTRAMUSCULAR | Status: DC | PRN
  Administered 2018-11-05: 2 mL

## 2018-11-05 MED ORDER — SODIUM CHLORIDE 0.9 % WEIGHT BASED INFUSION
1.0000 mL/kg/h | INTRAVENOUS | Status: DC
  Administered 2018-11-05: 13:00:00 1 mL/kg/h via INTRAVENOUS

## 2018-11-05 MED ORDER — HEPARIN SODIUM (PORCINE) 5000 UNIT/ML IJ SOLN
5000.0000 [IU] | Freq: Three times a day (TID) | INTRAMUSCULAR | Status: DC
  Administered 2018-11-05 – 2018-11-08 (×6): 5000 [IU] via SUBCUTANEOUS
  Filled 2018-11-05 (×7): qty 1

## 2018-11-05 SURGICAL SUPPLY — 10 items
CATH OPTITORQUE TIG 4.0 5F (CATHETERS) ×1 IMPLANT
DEVICE RAD COMP TR BAND LRG (VASCULAR PRODUCTS) ×1 IMPLANT
GLIDESHEATH SLEND SS 6F .021 (SHEATH) ×1 IMPLANT
GUIDEWIRE INQWIRE 1.5J.035X260 (WIRE) ×1 IMPLANT
INQWIRE 1.5J .035X260CM (WIRE) ×2
KIT HEART LEFT (KITS) ×2 IMPLANT
PACK CARDIAC CATHETERIZATION (CUSTOM PROCEDURE TRAY) ×2 IMPLANT
SHEATH PROBE COVER 6X72 (BAG) ×1 IMPLANT
TRANSDUCER W/STOPCOCK (MISCELLANEOUS) ×2 IMPLANT
TUBING CIL FLEX 10 FLL-RA (TUBING) ×2 IMPLANT

## 2018-11-05 NOTE — Progress Notes (Addendum)
Progress Note  Patient Name: Vanessa Campos Date of Encounter: 11/05/2018  Primary Cardiologist: Shelva Majestic, MD   Subjective   Chest pain resolved; continues to be on IV heparin  Inpatient Medications    Scheduled Meds:  aspirin EC  81 mg Oral Daily   atorvastatin  40 mg Oral q1800   metoprolol tartrate  12.5 mg Oral BID   sodium chloride flush  3 mL Intravenous Once   Continuous Infusions:  heparin 1,150 Units/hr (11/05/18 0300)   nitroGLYCERIN Stopped (11/04/18 1111)   PRN Meds: nitroGLYCERIN, ondansetron (ZOFRAN) IV   Vital Signs    Vitals:   11/05/18 0151 11/05/18 0158 11/05/18 0421 11/05/18 0821  BP: (!) 77/52 93/70 (!) 167/131 98/74  Pulse: 71 71 100 80  Resp: (!) 24 16  (!) 27  Temp:   98 F (36.7 C)   TempSrc:   Oral   SpO2: 93% 97% 95% 96%  Weight:   61.1 kg   Height:        Intake/Output Summary (Last 24 hours) at 11/05/2018 0853 Last data filed at 11/05/2018 0300 Gross per 24 hour  Intake 391.18 ml  Output --  Net 391.18 ml   Last 3 Weights 11/05/2018 11/02/2018 10/31/2018  Weight (lbs) 134 lb 12.8 oz 149 lb 14.6 oz 150 lb  Weight (kg) 61.145 kg 68 kg 68.04 kg      Telemetry    Sinus - Personally Reviewed  ECG    Normal sinus rhythm with mild RV conduction delay.- Personally Reviewed  Physical Exam   GEN: No acute distress.   Neck: No JVD Cardiac: RRR, no murmurs, rubs, or gallops.  No chest wall tenderness to palpation Respiratory:  Very faint end expiratory wheeze. GI: Soft, nontender, non-distended  MS: No edema; No deformity. Neuro:  Nonfocal  Psych: Normal affect   Labs    Chemistry Recent Labs  Lab 10/31/18 0137 11/02/18 2057 11/03/18 0723  NA 134* 129* 131*  K 3.5 3.3* 3.5  CL 97* 93* 95*  CO2 25 26 25   GLUCOSE 131* 154* 116*  BUN 9 13 9   CREATININE 0.65 0.89 0.76  CALCIUM 9.3 9.1 8.7*  PROT 7.4  --   --   ALBUMIN 3.9  --   --   AST 19  --   --   ALT 15  --   --   ALKPHOS 75  --   --   BILITOT 0.6  --    --   GFRNONAA >60 >60 >60  GFRAA >60 >60 >60  ANIONGAP 12 10 11      Hematology Recent Labs  Lab 11/03/18 0723 11/04/18 0434 11/05/18 0426  WBC 16.1* 13.5* 11.8*  RBC 5.44* 5.11 5.35*  HGB 15.4* 14.4 14.7  HCT 44.6 42.0 43.9  MCV 82.0 82.2 82.1  MCH 28.3 28.2 27.5  MCHC 34.5 34.3 33.5  RDW 13.6 13.6 13.4  PLT 333 335 351    Cardiac Enzymes Recent Labs  Lab 11/02/18 2057 11/03/18 0831 11/03/18 1413 11/03/18 2110  TROPONINI 7.06* 7.42* 6.35* 6.66*   No results for input(s): TROPIPOC in the last 168 hours.   BNPNo results for input(s): BNP, PROBNP in the last 168 hours.   DDimer No results for input(s): DDIMER in the last 168 hours.   Radiology    No results found.  Cardiac Studies   TTE: 11/04/18  IMPRESSIONS    1. The left ventricle has normal systolic function, with an ejection fraction of 55-60%. The  cavity size was normal. There is mildly increased left ventricular wall thickness. Left ventricular diastolic Doppler parameters are consistent with impaired  relaxation. There is akinesis of the LV apex. Remainder of the wall segments contract normally.  2. The right ventricle has normal systolic function. The cavity was normal. There is no increase in right ventricular wall thickness.  3. Mild calcification of the mitral valve leaflet. No evidence of mitral valve stenosis. Trivial mitral regurgitation.  4. The aortic valve is tricuspid. Mild calcification of the aortic valve. No stenosis of the aortic valve.  5. The aortic root is normal in size and structure.  6. The IVC was normal in size. PA systolic pressure 31 mmHg.  Patient Profile     68 y.o. female  with PMH of asthma, COPD, DVT, GERD, HTN, HL who presented to [facility] with chest pain and found to have a positive troponin. Transferred to [facility] for further work up.   Assessment & Plan    1. NSTEMI: Peak troponin 7.42. Remains on BB, high-dose statin, heparin. Nitro stopped yesterday 2/2 to low blood  pressures. Echo yesterday with normal EF of 55% and LVH, akinesis of the LV apex.  - Covid neg, will discuss with MD regarding timing of cardiac cath.  2. HL:  - no rx pta.>>high-dose statin - LDL 142, goal LDL < 70  3. HTN:  - blood pressures remain soft. Will dc metoprolol for now.  4. Dyspnea:  - known COPD, COVID neg - no fevers, CXR ok  5. Tobacco use: cessation advised  For questions or updates, please contact [place] Please consult [email or web address] for contact info under     Signed, Reino Bellis, NP  11/05/2018, 8:53 AM     Patient seen and examined. Agree with assessment and plan.  COVID-19 test is negative.  Patient is now on 6 E.  She has continued to be on IV heparin since admission.  Troponin curve is relatively flat but remained elevated at 7-6 range.  EF is 55 to 60% with apical akinesis.  Question mild Takotsubo versus distal LAD occlusion.  Her blood pressure remains low precluding medical therapy.  I discussed the consideration for definitive cardiac catheterization.  The patient has been n.p.o.  She would like to proceed with this today if at all possible.  Previously her chest pain was described as a significant chest tightness with some sharp twinges to her back.  I have contacted Dr. Glenetta Hew who will be the cath Dr. performing the procedure.  I discussed with him the patient's clinical history.The risks and benefits of a cardiac catheterization including, but not limited to, death, stroke, MI, kidney damage and bleeding were discussed with the patient who indicates understanding and agrees to proceed.  Plan to proceed with cardiac catheterization today. I also spooke with the daughter over the telephone and discussed the risk benefits of the procedure in detail who is in agreement to proceed with planned procedure   Troy Sine, MD, [facility] 11/05/2018 11:01 AM

## 2018-11-05 NOTE — H&P (View-Only) (Signed)
Progress Note  Patient Name: Vanessa Campos Date of Encounter: 11/05/2018  Primary Cardiologist: Shelva Majestic, MD   Subjective   Chest pain resolved; continues to be on IV heparin  Inpatient Medications    Scheduled Meds:  aspirin EC  81 mg Oral Daily   atorvastatin  40 mg Oral q1800   metoprolol tartrate  12.5 mg Oral BID   sodium chloride flush  3 mL Intravenous Once   Continuous Infusions:  heparin 1,150 Units/hr (11/05/18 0300)   nitroGLYCERIN Stopped (11/04/18 1111)   PRN Meds: nitroGLYCERIN, ondansetron (ZOFRAN) IV   Vital Signs    Vitals:   11/05/18 0151 11/05/18 0158 11/05/18 0421 11/05/18 0821  BP: (!) 77/52 93/70 (!) 167/131 98/74  Pulse: 71 71 100 80  Resp: (!) 24 16  (!) 27  Temp:   98 F (36.7 C)   TempSrc:   Oral   SpO2: 93% 97% 95% 96%  Weight:   61.1 kg   Height:        Intake/Output Summary (Last 24 hours) at 11/05/2018 0853 Last data filed at 11/05/2018 0300 Gross per 24 hour  Intake 391.18 ml  Output --  Net 391.18 ml   Last 3 Weights 11/05/2018 11/02/2018 10/31/2018  Weight (lbs) 134 lb 12.8 oz 149 lb 14.6 oz 150 lb  Weight (kg) 61.145 kg 68 kg 68.04 kg      Telemetry    Sinus - Personally Reviewed  ECG    Normal sinus rhythm with mild RV conduction delay.- Personally Reviewed  Physical Exam   GEN: No acute distress.   Neck: No JVD Cardiac: RRR, no murmurs, rubs, or gallops.  No chest wall tenderness to palpation Respiratory:  Very faint end expiratory wheeze. GI: Soft, nontender, non-distended  MS: No edema; No deformity. Neuro:  Nonfocal  Psych: Normal affect   Labs    Chemistry Recent Labs  Lab 10/31/18 0137 11/02/18 2057 11/03/18 0723  NA 134* 129* 131*  K 3.5 3.3* 3.5  CL 97* 93* 95*  CO2 25 26 25   GLUCOSE 131* 154* 116*  BUN 9 13 9   CREATININE 0.65 0.89 0.76  CALCIUM 9.3 9.1 8.7*  PROT 7.4  --   --   ALBUMIN 3.9  --   --   AST 19  --   --   ALT 15  --   --   ALKPHOS 75  --   --   BILITOT 0.6  --    --   GFRNONAA >60 >60 >60  GFRAA >60 >60 >60  ANIONGAP 12 10 11      Hematology Recent Labs  Lab 11/03/18 0723 11/04/18 0434 11/05/18 0426  WBC 16.1* 13.5* 11.8*  RBC 5.44* 5.11 5.35*  HGB 15.4* 14.4 14.7  HCT 44.6 42.0 43.9  MCV 82.0 82.2 82.1  MCH 28.3 28.2 27.5  MCHC 34.5 34.3 33.5  RDW 13.6 13.6 13.4  PLT 333 335 351    Cardiac Enzymes Recent Labs  Lab 11/02/18 2057 11/03/18 0831 11/03/18 1413 11/03/18 2110  TROPONINI 7.06* 7.42* 6.35* 6.66*   No results for input(s): TROPIPOC in the last 168 hours.   BNPNo results for input(s): BNP, PROBNP in the last 168 hours.   DDimer No results for input(s): DDIMER in the last 168 hours.   Radiology    No results found.  Cardiac Studies   TTE: 11/04/18  IMPRESSIONS    1. The left ventricle has normal systolic function, with an ejection fraction of 55-60%. The  cavity size was normal. There is mildly increased left ventricular wall thickness. Left ventricular diastolic Doppler parameters are consistent with impaired  relaxation. There is akinesis of the LV apex. Remainder of the wall segments contract normally.  2. The right ventricle has normal systolic function. The cavity was normal. There is no increase in right ventricular wall thickness.  3. Mild calcification of the mitral valve leaflet. No evidence of mitral valve stenosis. Trivial mitral regurgitation.  4. The aortic valve is tricuspid. Mild calcification of the aortic valve. No stenosis of the aortic valve.  5. The aortic root is normal in size and structure.  6. The IVC was normal in size. PA systolic pressure 31 mmHg.  Patient Profile     68 y.o. female  with PMH of asthma, COPD, DVT, GERD, HTN, HL who presented to [facility] with chest pain and found to have a positive troponin. Transferred to [facility] for further work up.   Assessment & Plan    1. NSTEMI: Peak troponin 7.42. Remains on BB, high-dose statin, heparin. Nitro stopped yesterday 2/2 to low blood  pressures. Echo yesterday with normal EF of 55% and LVH, akinesis of the LV apex.  - Covid neg, will discuss with MD regarding timing of cardiac cath.  2. HL:  - no rx pta.>>high-dose statin - LDL 142, goal LDL < 70  3. HTN:  - blood pressures remain soft. Will dc metoprolol for now.  4. Dyspnea:  - known COPD, COVID neg - no fevers, CXR ok  5. Tobacco use: cessation advised  For questions or updates, please contact [place] Please consult [email or web address] for contact info under     Signed, Reino Bellis, NP  11/05/2018, 8:53 AM     Patient seen and examined. Agree with assessment and plan.  COVID-19 test is negative.  Patient is now on 6 E.  She has continued to be on IV heparin since admission.  Troponin curve is relatively flat but remained elevated at 7-6 range.  EF is 55 to 60% with apical akinesis.  Question mild Takotsubo versus distal LAD occlusion.  Her blood pressure remains low precluding medical therapy.  I discussed the consideration for definitive cardiac catheterization.  The patient has been n.p.o.  She would like to proceed with this today if at all possible.  Previously her chest pain was described as a significant chest tightness with some sharp twinges to her back.  I have contacted Dr. Glenetta Hew who will be the cath Dr. performing the procedure.  I discussed with him the patient's clinical history.The risks and benefits of a cardiac catheterization including, but not limited to, death, stroke, MI, kidney damage and bleeding were discussed with the patient who indicates understanding and agrees to proceed.  Plan to proceed with cardiac catheterization today. I also spooke with the daughter over the telephone and discussed the risk benefits of the procedure in detail who is in agreement to proceed with planned procedure   Troy Sine, MD, [facility] 11/05/2018 11:01 AM

## 2018-11-05 NOTE — Plan of Care (Signed)

## 2018-11-05 NOTE — TOC Initial Note (Signed)
Transition of Care [facility]) - Initial/Assessment Note    Patient Details  Name: Vanessa Campos MRN: [id number] Date of Birth: 1951/05/05  [facility]) CM/SW Contact:    Carles Collet, RN Phone Number: 11/05/2018, 10:45 AM  Clinical Narrative:               Damaris Schooner w patient on the phone, then called daughter at her request. Spoke w Marye Round at [phone] who states that her mom had a PCP apt that was cancelled due to [place]. Per Epic she has been rescheduled for 6/1 w Linde Gillis, and she has a tele medicine visit on 4/13 at 9:15. CM discussed upcoming appointments with daughter.  No other CM needs at this time, will continue to follow.      Expected Discharge Plan: Home/Self Care Barriers to Discharge: Continued Medical Work up   Patient Goals and CMS Choice        Expected Discharge Plan and Services Expected Discharge Plan: Home/Self Care                                  Prior Living Arrangements/Services   Lives with:: Relatives                   Activities of Daily Living Home Assistive Devices/Equipment: None ADL Screening (condition at time of admission) Patient's cognitive ability adequate to safely complete daily activities?: Yes Is the patient deaf or have difficulty hearing?: No Does the patient have difficulty seeing, even when wearing glasses/contacts?: No Does the patient have difficulty concentrating, remembering, or making decisions?: No Patient able to express need for assistance with ADLs?: Yes Does the patient have difficulty dressing or bathing?: No Independently performs ADLs?: Yes (appropriate for developmental age) Does the patient have difficulty walking or climbing stairs?: No Weakness of Legs: None Weakness of Arms/Hands: None  Permission Sought/Granted                  Emotional Assessment              Admission diagnosis:  NSTEMI (non-ST elevated myocardial infarction) [facility]) [I21.4] Patient  Active Problem List   Diagnosis Date Noted  . ACS (acute coronary syndrome) ([place]) 11/03/2018  . NSTEMI (non-ST elevated myocardial infarction) ([place]) 11/02/2018  . COPD, mild ([place]) 02/02/2013  . Pulmonary embolism on right ([place]) 10/14/2012  . Hypotension, unspecified 10/11/2012  . Thyroid nodule 10/08/2012  . Unspecified constipation 10/08/2012  . Tobacco use disorder 10/05/2012  . GERD (gastroesophageal reflux disease) 10/05/2012   PCP:  Patient, No Pcp Per Pharmacy:   [facility] [place], [address] [place] [address] Phone: [phone] Fax: [phone]     Social Determinants of Health (SDOH) Interventions    Readmission Risk Interventions No flowsheet data found.

## 2018-11-05 NOTE — Progress Notes (Signed)
ANTICOAGULATION CONSULT NOTE -follow up  Pharmacy Consult for heparin Indication: ACS  Allergies  Allergen Reactions  . Tramadol     shaking  . Vicodin [Hydrocodone-Acetaminophen] Other (See Comments)    Shaking     Patient Measurements: Height: 5\' 3"  (160 cm) Weight: 134 lb 12.8 oz (61.1 kg) IBW/kg (Calculated) : 52.4 Heparin Dosing Weight: 66.2 kg  Vital Signs: Temp: 98 F (36.7 C) (04/08 0421) Temp Source: Oral (04/08 0421) BP: 98/74 (04/08 0821) Pulse Rate: 80 (04/08 0821)  Labs: Recent Labs    11/02/18 2057  11/03/18 0723 11/03/18 0831 11/03/18 1413 11/03/18 2110 11/04/18 0434 11/05/18 0426  HGB 16.2*  --  15.4*  --   --   --  14.4 14.7  HCT 48.4*  --  44.6  --   --   --  42.0 43.9  PLT 371  --  333  --   --   --  335 351  LABPROT  --   --   --   --  13.5  --   --   --   INR  --   --   --   --  1.0  --   --   --   HEPARINUNFRC  --    < > 0.44  --  0.19* 0.63 0.45 0.37  CREATININE 0.89  --  0.76  --   --   --   --   --   TROPONINI 7.06*  --   --  7.42* 6.35* 6.66*  --   --    < > = values in this interval not displayed.    Estimated Creatinine Clearance: 57.2 mL/min (by C-G formula based on SCr of 0.76 mg/dL).  Assessment: CC/HPI: 68 yo f presenting with CP  PMH:  DVT 2014, treated w/ 6 mon warf,  asthma, COPD, dyspnea, HTN, HLD, GERD  Anticoag: hep gtt for r/o ACS - no current AC PTA; hx DVT in 2014 (on warfarin for 6 months); hep lvl within goal  ID: 4/6 COVID 19 test : Negative  CV:  TSH wnl,  asa 81, lipitor 40mg    Pulm: covid 19  negative , no inhalation meds ordered H/o COPD, asthma CTA neg for PE  Heme/Onc: H&H 14.7/43.9, Plt 351  Goal of Therapy:  Heparin level 0.3-0.7 units/ml Monitor platelets by anticoagulation protocol: Yes   Plan:  Continue heparin infusion at 1150 units/hr Daily HL CBC  -further cardiac eval to be decided per cardiology as COVID resulted as negative today.    Nicole Cella, RPh Clinical Pharmacist Pager:  [phone] Please check AMION for all [place] numbers 11/05/2018 9:33 AM

## 2018-11-05 NOTE — Interval H&P Note (Signed)
History and Physical Interval Note:  11/05/2018 1:28 PM  Vanessa Campos  has presented today for surgery, with the diagnosis of elevated trop.  The various methods of treatment have been discussed with the patient and family. After consideration of risks, benefits and other options for treatment, the patient has consented to  Procedure(s): LEFT HEART CATH AND CORONARY ANGIOGRAPHY (N/A) with possible PERCUTANEOUS CORONARY INTERVENTION as a surgical intervention.  The patient's history has been reviewed, patient examined, no change in status, stable for surgery.  I have reviewed the patient's chart and labs.  Questions were answered to the patient's satisfaction.    Cath Lab Visit (complete for each Cath Lab visit)  Clinical Evaluation Leading to the Procedure:   ACS: Yes.    Non-ACS:    Anginal Classification: CCS III  Anti-ischemic medical therapy: Minimal Therapy (1 class of medications)  Non-Invasive Test Results: No non-invasive testing performed  Prior CABG: No previous CABG    Glenetta Hew

## 2018-11-05 NOTE — Plan of Care (Signed)
  Problem: Education: Goal: Knowledge of General Education information will improve Description Including pain rating scale, medication(s)/side effects and non-pharmacologic comfort measures 11/05/2018 0036 by Winona Legato, Amedeo Gory, RN Outcome: Progressing 11/05/2018 0029 by Winona Legato, Amedeo Gory, RN Outcome: Progressing   Problem: Health Behavior/Discharge Planning: Goal: Ability to manage health-related needs will improve 11/05/2018 0036 by Henrine Screws, RN Outcome: Progressing 11/05/2018 0029 by Winona Legato, Amedeo Gory, RN Outcome: Progressing   Problem: Clinical Measurements: Goal: Ability to maintain clinical measurements within normal limits will improve 11/05/2018 0036 by Henrine Screws, RN Outcome: Progressing 11/05/2018 0029 by Winona Legato, Amedeo Gory, RN Outcome: Progressing Goal: Will remain free from infection 11/05/2018 0036 by Henrine Screws, RN Outcome: Progressing 11/05/2018 0029 by Winona Legato, Amedeo Gory, RN Outcome: Progressing Goal: Diagnostic test results will improve 11/05/2018 0036 by Winona Legato, Amedeo Gory, RN Outcome: Progressing 11/05/2018 0029 by Winona Legato, Amedeo Gory, RN Outcome: Progressing Goal: Respiratory complications will improve 11/05/2018 0036 by Henrine Screws, RN Outcome: Progressing 11/05/2018 0029 by Winona Legato, Amedeo Gory, RN Outcome: Progressing Goal: Cardiovascular complication will be avoided 11/05/2018 0036 by Henrine Screws, RN Outcome: Progressing 11/05/2018 0029 by Winona Legato, Amedeo Gory, RN Outcome: Progressing   Problem: Activity: Goal: Risk for activity intolerance will decrease 11/05/2018 0036 by Winona Legato, Amedeo Gory, RN Outcome: Progressing 11/05/2018 0029 by Winona Legato, Amedeo Gory, RN Outcome: Progressing   Problem: Activity: Goal: Risk for activity intolerance will decrease 11/05/2018 0036 by Winona Legato, Amedeo Gory, RN Outcome: Progressing 11/05/2018 0029 by Winona Legato, Amedeo Gory, RN Outcome: Progressing   Problem: Coping: Goal: Level of anxiety will decrease 11/05/2018 0036 by Winona Legato, Amedeo Gory, RN Outcome: Progressing 11/05/2018 0029 by Winona Legato, Amedeo Gory, RN Outcome: Progressing   Problem: Elimination: Goal: Will not experience complications related to bowel motility 11/05/2018 0036 by Henrine Screws, RN Outcome: Progressing 11/05/2018 0029 by Winona Legato, Amedeo Gory, RN Outcome: Progressing Goal: Will not experience complications related to urinary retention 11/05/2018 0036 by Henrine Screws, RN Outcome: Progressing 11/05/2018 0029 by Winona Legato, Amedeo Gory, RN Outcome: Progressing

## 2018-11-06 ENCOUNTER — Encounter (HOSPITAL_COMMUNITY): Payer: Self-pay | Admitting: Cardiology

## 2018-11-06 DIAGNOSIS — I2542 Coronary artery dissection: Secondary | ICD-10-CM

## 2018-11-06 DIAGNOSIS — I440 Atrioventricular block, first degree: Secondary | ICD-10-CM

## 2018-11-06 DIAGNOSIS — I249 Acute ischemic heart disease, unspecified: Secondary | ICD-10-CM

## 2018-11-06 LAB — CBC
HCT: 39.8 % (ref 36.0–46.0)
Hemoglobin: 13.1 g/dL (ref 12.0–15.0)
MCH: 27.3 pg (ref 26.0–34.0)
MCHC: 32.9 g/dL (ref 30.0–36.0)
MCV: 82.9 fL (ref 80.0–100.0)
Platelets: 332 10*3/uL (ref 150–400)
RBC: 4.8 MIL/uL (ref 3.87–5.11)
RDW: 13.5 % (ref 11.5–15.5)
WBC: 8.4 10*3/uL (ref 4.0–10.5)
nRBC: 0 % (ref 0.0–0.2)

## 2018-11-06 MED ORDER — SODIUM CHLORIDE 0.9 % IV SOLN
INTRAVENOUS | Status: AC
  Administered 2018-11-06: 10:00:00 via INTRAVENOUS

## 2018-11-06 MED ORDER — DOCUSATE SODIUM 100 MG PO CAPS
100.0000 mg | ORAL_CAPSULE | Freq: Every day | ORAL | Status: DC | PRN
  Administered 2018-11-06: 17:00:00 100 mg via ORAL
  Filled 2018-11-06: qty 1

## 2018-11-06 MED FILL — Heparin Sod (Porcine)-NaCl IV Soln 1000 Unit/500ML-0.9%: INTRAVENOUS | Qty: 500 | Status: AC

## 2018-11-06 NOTE — Care Management Important Message (Signed)
Important Message  Patient Details  Name: Vanessa Campos MRN: [id number] Date of Birth: [birth year]   Medicare Important Message Given:  Yes Patient is not able to sign due to illness   Orbie Pyo 11/06/2018, 4:06 PM

## 2018-11-06 NOTE — Progress Notes (Signed)
Cardiac Rehab Phase I is not seeing pts face to face at this time in light of Covid 19 protocols. I called pt in room.  Pt sts she has not had CP since yesterday. She got to the recliner briefly. I called pts RN and she or the mobility team will ambulate pt later today after pt receives fluids for BP.  I discussed education with pt. We discussed MI, restrictions, smoking cessation in depth, diet, exercising at home, NTG, and CRPII. Pt receptive although probably overwhelmed by all the information. She is wanting to quit smoking and thinks she will try patches at home. I will give her smoking cessation information and resources. I will also send MI book, diet, ex gl, and CRPII brochure. I will give her MI and smoking cessation videos to watch. I will refer pt to [facility] for when program re-opens. Pt to ask her RN or other healthteam if questions. [id number] Yves Dill CES, ACSM 11:02 AM 11/06/2018

## 2018-11-06 NOTE — Care Management Important Message (Signed)
Important Message  Patient Details  Name: Vanessa Campos MRN: [id number] Date of Birth: August 28, 1950   Medicare Important Message Given:       Orbie Pyo 11/06/2018, 4:06 PM

## 2018-11-06 NOTE — Plan of Care (Signed)
  Problem: Clinical Measurements: Goal: Diagnostic test results will improve Outcome: Progressing Goal: Cardiovascular complication will be avoided Outcome: Progressing   Problem: Clinical Measurements: Goal: Respiratory complications will improve Outcome: Completed/Met Maintains oxygen saturation on room air   Problem: Activity: Goal: Risk for activity intolerance will decrease Outcome: Completed/Met  Pt ambulates independently

## 2018-11-06 NOTE — Progress Notes (Signed)
Progress Note  Patient Name: Vanessa Campos Date of Encounter: 11/06/2018  Primary Cardiologist: Shelva Majestic, MD   Subjective   No recurrent chest pain.  Inpatient Medications    Scheduled Meds: . aspirin EC  81 mg Oral Daily  . atorvastatin  40 mg Oral q1800  . clopidogrel  75 mg Oral Q breakfast  . heparin  5,000 Units Subcutaneous Q8H  . sodium chloride flush  3 mL Intravenous Once  . sodium chloride flush  3 mL Intravenous Q12H   Continuous Infusions: . sodium chloride    . nitroGLYCERIN Stopped (11/04/18 1111)   PRN Meds: sodium chloride, nitroGLYCERIN, ondansetron (ZOFRAN) IV, sodium chloride flush   Vital Signs    Vitals:   11/05/18 2040 11/05/18 2050 11/06/18 0030 11/06/18 0215  BP: (!) 75/53 91/61 (!) 82/56 95/65  Pulse: 91 86 75 76  Resp: 16   17  Temp:      TempSrc:      SpO2: 95% 96% 96% 97%  Weight:    62.6 kg  Height:        Intake/Output Summary (Last 24 hours) at 11/06/2018 0837 Last data filed at 11/06/2018 0209 Gross per 24 hour  Intake 462 ml  Output 800 ml  Net -338 ml   Last 3 Weights 11/06/2018 11/05/2018 11/02/2018  Weight (lbs) 138 lb 134 lb 12.8 oz 149 lb 14.6 oz  Weight (kg) 62.596 kg 61.145 kg 68 kg      Telemetry    Sinus 70-80s- Personally Reviewed  ECG    11/05/2018 ECG (independently read by me):sinus rhythm with marked first-degree AV block with a PR interval at 424 ms  We will repeat ECG today  Physical Exam   GEN: No acute distress.   Neck: No JVD Cardiac: RRR, no murmurs, rubs, or gallops.  Respiratory:  Diffuse rhonchi consistent with her COPD GI: Soft, nontender, non-distended  MS: No edema; No deformity. Neuro:  Nonfocal  Psych: Normal affect   Labs    Chemistry Recent Labs  Lab 10/31/18 0137 11/02/18 2057 11/03/18 0723 11/05/18 1120  NA 134* 129* 131* 133*  K 3.5 3.3* 3.5 3.6  CL 97* 93* 95* 99  CO2 25 26 25  21*  GLUCOSE 131* 154* 116* 110*  BUN 9 13 9 10   CREATININE 0.65 0.89 0.76 0.80   CALCIUM 9.3 9.1 8.7* 8.9  PROT 7.4  --   --   --   ALBUMIN 3.9  --   --   --   AST 19  --   --   --   ALT 15  --   --   --   ALKPHOS 75  --   --   --   BILITOT 0.6  --   --   --   GFRNONAA >60 >60 >60 >60  GFRAA >60 >60 >60 >60  ANIONGAP 12 10 11 13      Hematology Recent Labs  Lab 11/04/18 0434 11/05/18 0426 11/06/18 0414  WBC 13.5* 11.8* 8.4  RBC 5.11 5.35* 4.80  HGB 14.4 14.7 13.1  HCT 42.0 43.9 39.8  MCV 82.2 82.1 82.9  MCH 28.2 27.5 27.3  MCHC 34.3 33.5 32.9  RDW 13.6 13.4 13.5  PLT 335 351 332    Cardiac Enzymes Recent Labs  Lab 11/02/18 2057 11/03/18 0831 11/03/18 1413 11/03/18 2110  TROPONINI 7.06* 7.42* 6.35* 6.66*   No results for input(s): TROPIPOC in the last 168 hours.   BNPNo results for input(s): BNP, PROBNP  in the last 168 hours.   DDimer No results for input(s): DDIMER in the last 168 hours.   Lipid Panel     Component Value Date/Time   CHOL 215 (H) 11/04/2018 0434   TRIG 83 11/04/2018 0434   HDL 56 11/04/2018 0434   CHOLHDL 3.8 11/04/2018 0434   VLDL 17 11/04/2018 0434   LDLCALC 142 (H) 11/04/2018 0434   Radiology    No results found.  Cardiac Studies   Cath: 11/05/18   1) Dist LAD lesion is 95% stenosed.  2) Dist Cx lesion is 100% stenosed with 95% stenosed side branch in Ost 2nd Mrg to 2nd Mrg.  3) Post Atrio lesion is 80% stenosed. (Borderline subtotal occluded)  Mid RCA lesion is 30% stenosed --with mild myocardial bridging  Mid LAD lesion is 10% stenosed--with mild myocardial bridging  LV end diastolic pressure is low.  There is no aortic valve stenosis.   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  Low LVEDP with systemic hypo-tension.  RECOMMENDATIONS  She will return to her  nursing unit for ongoing care  Stop IV heparin as this appears to be more consistent with SCAD -we will simply start Plavix with loading today.  Pending blood pressure stabilization, may want to consider long-acting nitrate  Otherwise risk factor modifications per primary cardiology team   Glenetta Hew, M.D., M.S.  TTE: 11/04/18  IMPRESSIONS    1. The left ventricle has normal systolic function, with an ejection fraction of 55-60%. The cavity size was normal. There is mildly increased left ventricular wall thickness. Left ventricular diastolic Doppler parameters are consistent with impaired  relaxation. There is akinesis of the LV apex. Remainder of the wall segments contract normally.  2. The right ventricle has normal systolic function. The cavity was normal. There is no increase in right ventricular wall thickness.  3. Mild calcification of the mitral valve leaflet. No evidence of mitral valve stenosis. Trivial mitral regurgitation.  4. The aortic valve is tricuspid. Mild calcification of the aortic valve. No stenosis of the aortic valve.  5. The aortic root is normal in size and structure.  6. The IVC was normal in size. PA systolic pressure 31 mmHg.  Patient Profile     68 y.o. female with PMH of asthma, COPD, DVT, GERD, HTN, HL who presented to [facility] with chest pain and found to have a positive troponin. Transferred to [facility] for further work up.    Assessment & Plan    1. NSTEMI:Peak troponin 7.42.Underwent cardiac cath noted above with 3v disease to the dLAD. LCx and RPA-subtotal to totally occluded suggestive of SCAD.Low LVEDP. Heparin was stopped and placed on plavix. Blood pressures have been to low for addition of BB therapy. Given low EDP will start gentle IVFs today. Will order cardiac rehab.   2. HL: - no rx pta.>>high-dose statin - LDL 142, goal LDL < 70  3. HTN: - give IVFs today as above  4. Dyspnea: - known COPD, COVID neg. No fevers, CXR ok  5.  Tobacco [id number] advised  For questions or updates, please contact [place] Please consult [email or web address] for contact info under   Signed, Reino Bellis, NP  11/06/2018, 8:37 AM       Patient seen and examined. Agree with  assessment and plan.  No recurrent chest pain.  I personally reviewed the cath films with Dr. Ellyn Hack yesterday in the lab.  Distal pruning of vessels is suggestive of SCAD.  Agree with initiation of Plavix for antiplatelet therapy.  LVEDP was very low and with depleted volume status undoubtedly contributory to the patient's low blood pressure.  Echo Doppler study shows apical akinesis as result of the distal occlusions.  She is now getting fluids.  Resting pulse is in the upper 80s.  On exam today, no JVD.  Diffuse scattered rhonchi consistent with her COPD.  Rhythm is regular without ectopy.  Faint 1/6 systolic murmur.  Abdomen soft.  No edema.  LDL 145 on atorvastatin, will increase to 80 mg.  ECG yesterday showed significant prolongation of PR interval at 424 ms.  We will repeat ECG this morning and again tomorrow.  Although patient may ultimately benefit from reinstitution of beta-blocker therapy will hold reinitiating this at present in light of the markedly prolonged PR interval.  If blood pressure allows may ultimately benefit from low-dose nitrate therapy.  I also discussed the importance of smoking cessation.  Troy Sine, MD, [facility] 11/06/2018 10:16 AM

## 2018-11-07 DIAGNOSIS — E861 Hypovolemia: Secondary | ICD-10-CM

## 2018-11-07 DIAGNOSIS — I9589 Other hypotension: Secondary | ICD-10-CM

## 2018-11-07 LAB — CBC
HCT: 37.5 % (ref 36.0–46.0)
Hemoglobin: 12.7 g/dL (ref 12.0–15.0)
MCH: 28 pg (ref 26.0–34.0)
MCHC: 33.9 g/dL (ref 30.0–36.0)
MCV: 82.8 fL (ref 80.0–100.0)
Platelets: 350 10*3/uL (ref 150–400)
RBC: 4.53 MIL/uL (ref 3.87–5.11)
RDW: 13.4 % (ref 11.5–15.5)
WBC: 7.1 10*3/uL (ref 4.0–10.5)
nRBC: 0 % (ref 0.0–0.2)

## 2018-11-07 MED ORDER — SODIUM CHLORIDE 0.9 % IV SOLN
INTRAVENOUS | Status: DC
  Administered 2018-11-07: 13:00:00 via INTRAVENOUS

## 2018-11-07 NOTE — Progress Notes (Signed)
Patient watched Educational videos recommended by cardiac rehab and stated they were every helpful.

## 2018-11-07 NOTE — Progress Notes (Signed)
Progress Note  Patient Name: Vanessa Campos Date of Encounter: 11/07/2018  Primary Cardiologist: Shelva Majestic, MD   Subjective   No recurrent chest pain  Inpatient Medications    Scheduled Meds: . aspirin EC  81 mg Oral Daily  . atorvastatin  40 mg Oral q1800  . clopidogrel  75 mg Oral Q breakfast  . heparin  5,000 Units Subcutaneous Q8H  . sodium chloride flush  3 mL Intravenous Once   Continuous Infusions: . sodium chloride    . nitroGLYCERIN Stopped (11/04/18 1111)   PRN Meds: sodium chloride, docusate sodium, nitroGLYCERIN, ondansetron (ZOFRAN) IV   Vital Signs    Vitals:   11/06/18 0215 11/06/18 1219 11/06/18 2128 11/07/18 0616  BP: 95/65 (!) 85/59 91/63 (!) 86/62  Pulse: 76  70 62  Resp: 17     Temp:   98.1 F (36.7 C) 98.8 F (37.1 C)  TempSrc:   Oral Oral  SpO2: 97%  100% 95%  Weight: 62.6 kg   62.6 kg  Height:        Intake/Output Summary (Last 24 hours) at 11/07/2018 0803 Last data filed at 11/06/2018 2130 Gross per 24 hour  Intake 1100.13 ml  Output -  Net 1100.13 ml   Last 3 Weights 11/07/2018 11/06/2018 11/05/2018  Weight (lbs) 138 lb 1.6 oz 138 lb 134 lb 12.8 oz  Weight (kg) 62.642 kg 62.596 kg 61.145 kg      Telemetry    Sinus rhythm- Personally Reviewed  ECG    11/07/2018 ECG (independently read by me): NSR at rate of 63 bpm and PR interval of 221ms    11/06/2018 ECG (independently read by me): Normal sinus rhythm, first-degree block with PR interval improved at 290 ms  11/05/2018 ECG (independently read by me):sinus rhythm with marked first-degree AV block with a PR interval at 424 ms  Physical Exam   GEN: No acute distress.   Neck: No JVD Cardiac: RRR, no murmurs, rubs, or gallops.  Respiratory: Clear to auscultation bilaterally. GI: Soft, nontender, non-distended  MS: No edema; No deformity. Neuro:  Nonfocal  Psych: Normal affect   Labs    Chemistry Recent Labs  Lab 11/02/18 2057 11/03/18 0723 11/05/18 1120  NA 129*  131* 133*  K 3.3* 3.5 3.6  CL 93* 95* 99  CO2 26 25 21*  GLUCOSE 154* 116* 110*  BUN 13 9 10   CREATININE 0.89 0.76 0.80  CALCIUM 9.1 8.7* 8.9  GFRNONAA >60 >60 >60  GFRAA >60 >60 >60  ANIONGAP 10 11 13      Hematology Recent Labs  Lab 11/05/18 0426 11/06/18 0414 11/07/18 0526  WBC 11.8* 8.4 7.1  RBC 5.35* 4.80 4.53  HGB 14.7 13.1 12.7  HCT 43.9 39.8 37.5  MCV 82.1 82.9 82.8  MCH 27.5 27.3 28.0  MCHC 33.5 32.9 33.9  RDW 13.4 13.5 13.4  PLT 351 332 350    Cardiac Enzymes Recent Labs  Lab 11/02/18 2057 11/03/18 0831 11/03/18 1413 11/03/18 2110  TROPONINI 7.06* 7.42* 6.35* 6.66*   No results for input(s): TROPIPOC in the last 168 hours.   BNPNo results for input(s): BNP, PROBNP in the last 168 hours.   DDimer No results for input(s): DDIMER in the last 168 hours.   Radiology    No results found.  Cardiac Studies   Cath: 11/05/18   1) Dist LAD lesion is 95% stenosed.  2) Dist Cx lesion is 100% stenosed with 95% stenosed side branch in [place] 2nd Mrg  to 2nd Mrg.  3) Post Atrio lesion is 80% stenosed. (Borderline subtotal occluded)  Mid RCA lesion is 30% stenosed --with mild myocardial bridging  Mid LAD lesion is 10% stenosed--with mild myocardial bridging  LV end diastolic pressure is low.  There is no aortic valve stenosis.  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  Low LVEDP with systemic hypo-tension.  RECOMMENDATIONS  She will return to her nursing unit for ongoing care  Stop IV heparin as this appears to be more consistent with SCAD -we will simply start Plavix with loading today.  Pending blood pressure stabilization, may want to consider long-acting nitrate  Otherwise risk factor modifications per  primary cardiology team   Glenetta Hew, M.D., M.S.  TTE: 11/04/18  IMPRESSIONS   1. The left ventricle has normal systolic function, with an ejection fraction of 55-60%. The cavity size was normal. There is mildly increased left ventricular wall thickness. Left ventricular diastolic Doppler parameters are consistent with impaired  relaxation. There is akinesis of the LV apex. Remainder of the wall segments contract normally. 2. The right ventricle has normal systolic function. The cavity was normal. There is no increase in right ventricular wall thickness. 3. Mild calcification of the mitral valve leaflet. No evidence of mitral valve stenosis. Trivial mitral regurgitation. 4. The aortic valve is tricuspid. Mild calcification of the aortic valve. No stenosis of the aortic valve. 5. The aortic root is normal in size and structure. 6. The IVC was normal in size. PA systolic pressure 31 mmHg.  Patient Profile     68 y.o. female with PMH of asthma, COPD, DVT, GERD, HTN, HL who presented to [facility] with chest pain and found to have a positive troponin. Transferred to [facility] for further work up.   Assessment & Plan    1. NSTEMI:Peak troponin 7.42.Underwent cardiac cath noted above with 3v disease to the dLAD, LCx and RPA-subtotal to totally occluded suggestive of SCAD. Low LVEDP. Heparin was stopped and placed on plavix. Blood pressures have been to low for addition of BB therapy. Also PR interval of 263ms today. Given low EDP will start gentle IVFs today. Will order cardiac rehab.   2. HL: - no rx pta.>>high-dose statin -LDL 142, goal LDL < 70  3. HTN: -give IVFs today as above  4. Dyspnea: - known COPD, COVIDneg. No fevers, CXR ok  5. Tobacco [id number] advised    For questions or updates, please contact [place] Please consult [email or web address] for contact info under      SignedLeanor Kail, PA  11/07/2018, 8:03 AM     Patient seen and  examined. Agree with assessment and plan.  No recurrent chest pain.  Blood pressure remains low precluding anti-ischemic medications.  PR interval is slowly improving from 424 ms on 4/8 to 290 ms 4/9 and 286 ms today.  No dizziness.  Blood pressure remains low.  Will start low-dose hydration with NS at 50 cc/h.  Continue Plavix and statin.     Troy Sine, MD, [facility] 11/07/2018 12:01 PM

## 2018-11-08 ENCOUNTER — Encounter (HOSPITAL_COMMUNITY): Payer: Self-pay | Admitting: Physician Assistant

## 2018-11-08 DIAGNOSIS — I2510 Atherosclerotic heart disease of native coronary artery without angina pectoris: Secondary | ICD-10-CM

## 2018-11-08 LAB — CBC
HCT: 37 % (ref 36.0–46.0)
Hemoglobin: 12.3 g/dL (ref 12.0–15.0)
MCH: 27.5 pg (ref 26.0–34.0)
MCHC: 33.2 g/dL (ref 30.0–36.0)
MCV: 82.8 fL (ref 80.0–100.0)
Platelets: 334 10*3/uL (ref 150–400)
RBC: 4.47 MIL/uL (ref 3.87–5.11)
RDW: 13.4 % (ref 11.5–15.5)
WBC: 7 10*3/uL (ref 4.0–10.5)
nRBC: 0 % (ref 0.0–0.2)

## 2018-11-08 MED ORDER — SODIUM CHLORIDE 0.9 % IV BOLUS
500.0000 mL | Freq: Once | INTRAVENOUS | Status: AC
  Administered 2018-11-08: 10:00:00 500 mL via INTRAVENOUS

## 2018-11-08 MED ORDER — ATORVASTATIN CALCIUM 40 MG PO TABS: 40.0000 mg | ORAL_TABLET | Freq: Every day | ORAL | 3 refills | Status: AC

## 2018-11-08 MED ORDER — CLOPIDOGREL BISULFATE 75 MG PO TABS: 75.0000 mg | ORAL_TABLET | Freq: Every day | ORAL | 3 refills | Status: AC

## 2018-11-08 MED ORDER — NITROGLYCERIN 0.4 MG SL SUBL: 0.4000 mg | SUBLINGUAL_TABLET | SUBLINGUAL | 12 refills | Status: AC | PRN

## 2018-11-08 MED ORDER — ASPIRIN 81 MG PO TBEC: 81.0000 mg | DELAYED_RELEASE_TABLET | Freq: Every day | ORAL | Status: AC

## 2018-11-08 NOTE — Discharge Summary (Signed)
Discharge Summary    Patient ID: Vanessa Campos MRN: [id number]; DOB: 02-05-1951  Admit date: 11/02/2018 Discharge date: 11/08/2018  Primary Care Provider: Patient, No Pcp Per  Primary Cardiologist: Shelva Majestic, MD  Primary Electrophysiologist:  None   Discharge Diagnoses    Principal Problem:   NSTEMI (non-ST elevated myocardial infarction) [facility]) Active Problems:   Tobacco use disorder   GERD (gastroesophageal reflux disease)   Hypotension, unspecified   COPD, mild (HCC)   CAD (coronary artery disease)   Allergies Allergies  Allergen Reactions   Tramadol     shaking   Vicodin [Hydrocodone-Acetaminophen] Other (See Comments)    Shaking     Diagnostic Studies/Procedures    Echo 11/04/18 IMPRESSIONS  1. The left ventricle has normal systolic function, with an ejection fraction of 55-60%. The cavity size was normal. There is mildly increased left ventricular wall thickness. Left ventricular diastolic Doppler parameters are consistent with impaired  relaxation. There is akinesis of the LV apex. Remainder of the wall segments contract normally.  2. The right ventricle has normal systolic function. The cavity was normal. There is no increase in right ventricular wall thickness.  3. Mild calcification of the mitral valve leaflet. No evidence of mitral valve stenosis. Trivial mitral regurgitation.  4. The aortic valve is tricuspid. Mild calcification of the aortic valve. No stenosis of the aortic valve.  5. The aortic root is normal in size and structure.  6. The IVC was normal in size. PA systolic pressure 31 mmHg.  FINDINGS  Left Ventricle: The left ventricle has normal systolic function, with an ejection fraction of 55-60%. The cavity size was normal. There is mildly increased left ventricular wall thickness. Left ventricular diastolic Doppler parameters are consistent  with impaired relaxation. Right Ventricle: The right ventricle has normal systolic function. The  cavity was normal. There is no increase in right ventricular wall thickness. Left Atrium: Left atrial size was normal in size. Right Atrium: Right atrial size was normal in size. Right atrial pressure is estimated at 10 mmHg. Interatrial Septum: No atrial level shunt detected by color flow Doppler. Pericardium: There is no evidence of pericardial effusion. Mitral Valve: The mitral valve is normal in structure. Mild calcification of the mitral valve leaflet. Mitral valve regurgitation is trivial by color flow Doppler. No evidence of mitral valve stenosis. Tricuspid Valve: The tricuspid valve is normal in structure. Tricuspid valve regurgitation is trivial by color flow Doppler. Aortic Valve: The aortic valve is tricuspid Mild calcification of the aortic valve. Aortic valve regurgitation was not visualized by color flow Doppler. There is No stenosis of the aortic valve. Pulmonic Valve: The pulmonic valve was normal in structure. Pulmonic valve regurgitation is not visualized by color flow Doppler. Aorta: The aortic root is normal in size and structure. Venous: The inferior vena cava is normal in size with greater than 50% respiratory variability.  _____________   Cath 11/05/18 LEFT HEART CATH AND CORONARY ANGIOGRAPHY  Conclusion     1) Dist LAD lesion is 95% stenosed.  2) Dist Cx lesion is 100% stenosed with 95% stenosed side branch in Ost 2nd Mrg to 2nd Mrg.  3) Post Atrio lesion is 80% stenosed. (Borderline subtotal occluded)  Mid RCA lesion is 30% stenosed --with mild myocardial bridging  Mid LAD lesion is 10% stenosed--with mild myocardial bridging  LV end diastolic pressure is low.  There is no aortic valve stenosis.   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  Low LVEDP with systemic hypo-tension.  RECOMMENDATIONS  She will return to her nursing unit for ongoing care  Stop IV heparin as this appears to be more consistent with SCAD -we will simply start Plavix with loading today.  Pending blood pressure stabilization, may want to consider long-acting nitrate  Otherwise risk factor modifications per primary cardiology team     History of Present Illness     Vanessa Campos is a 68 y.o. female with a history of COPD with ongoing tobacco abuse, DVT, GERD, HLD and HTN who was admitted to [facility] on 11/03/18 for chest pain, diarrhea and abdominal pain.   Of note was seen by cardiology back in 2006 and underwent cardiac cath with no obstructive CAD.  She had recently lost her niece about a week ago. She presented to [facility] on 11/02/2018 with abdominal pain, diarrhea and chest pain. Troponin found to be 7.06. She was admitted for NSTEMI and coronavirus rule out.   Hospital Course     Consultants: none  1. NSTEMI - Peak troponin 7.42. When coronavirus resulted negative she underwent cardiac cath noted above with 3v distal disease to the dLAD,LCx and RPA-subtotal to totally occluded suggestive of SCAD. All disease is small vessel and too small for PCI. Low LVEDP. - 10/2018 echo LVEF 30-16%, grade I diastolic dysfunction, apical akinesis - she reports a niece passed just a few days ago, with apical hypokinesis unclear if could be component of Takotsubo CM or if WMA is due to her distal LAD disease.  - medical therapy with ASA, atorva 40, plavix 75. Has not started beta blocker due to low bp's and 1st degree AV block, no ACE due to low bp's. Reconsider at outpatient follow up.   2. Hypovolemia/Hypotension - low LVEDP by cath 2-5. Hyponatremic on admission. She does report some N/V/D prior to admission that has resolved - has had low bp's at times. She reports chornic SBPs in the 90s, however chart review shows SBP 120s-130s.  - she  has been treated with gentle IVFs at 74mL/hr overnight. She was given a bolus 555mL prior to discharge home. Post procedure Hgb normal.   3. 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  _____________  Discharge Vitals Blood pressure (!) 89/59, pulse 63, temperature 98.5 F (36.9 C), temperature source Oral, resp. rate 17, height 5\' 3"  (1.6 m), weight 63 kg, SpO2 99 %.  Filed Weights   11/06/18 0215 11/07/18 0616 11/08/18 0507  Weight: 62.6 kg 62.6 kg 63 kg    Labs & Radiologic Studies    CBC Recent Labs    11/07/18 0526 11/08/18 0307  WBC 7.1 7.0  HGB 12.7 12.3  HCT 37.5 37.0  MCV 82.8 82.8  PLT 350 010   Basic Metabolic Panel No results for input(s): NA, K, CL, CO2, GLUCOSE, BUN, CREATININE, CALCIUM, MG, PHOS in the last 72 hours. Liver Function Tests No results for input(s): AST, ALT, ALKPHOS, BILITOT, PROT, ALBUMIN in the last 72 hours. No results for input(s): LIPASE, AMYLASE in the last 72 hours. Cardiac Enzymes No results for input(s): CKTOTAL, CKMB, CKMBINDEX, TROPONINI in the last 72 hours. BNP Invalid input(s): POCBNP D-Dimer No results for input(s): DDIMER in the  last 72 hours. Hemoglobin A1C No results for input(s): HGBA1C in the last 72 hours. Fasting Lipid Panel No results for input(s): CHOL, HDL, LDLCALC, TRIG, CHOLHDL, LDLDIRECT in the last 72 hours. Thyroid Function Tests No results for input(s): TSH, T4TOTAL, T3FREE, THYROIDAB in the last 72 hours.  Invalid input(s): FREET3 _____________  Dg Chest 2 View  Result Date: 11/02/2018 CLINICAL DATA:  Chest pain 2 days with back spasms. EXAM: CHEST - 2 VIEW COMPARISON:  10/31/2018 FINDINGS: Lungs are adequately inflated without consolidation or effusion. Cardiomediastinal silhouette and remainder of the exam is unchanged. IMPRESSION: No active cardiopulmonary disease. Electronically Signed   By: Marin Olp  M.D.   On: 11/02/2018 20:56   Dg Chest 2 View  Result Date: 10/31/2018 CLINICAL DATA:  Cough. Abdominal pain. History of COPD, hypertension and hyperlipidemia. EXAM: CHEST - 2 VIEW COMPARISON:  Chest radiograph March 03, 2015 FINDINGS: Cardiomediastinal silhouette is normal. No pleural effusions or focal consolidations. Trachea projects midline and there is no pneumothorax. Soft tissue planes and included osseous structures are non-suspicious. IMPRESSION: No active cardiopulmonary process. Electronically Signed   By: Elon Alas M.D.   On: 10/31/2018 03:41   Dg Shoulder Right  Result Date: 10/19/2018 CLINICAL DATA:  Right shoulder pain for 3 weeks.  No known injury. EXAM: RIGHT SHOULDER - 2+ VIEW COMPARISON:  Plain films right shoulder 06/27/2007. FINDINGS: There is no acute bony or joint abnormality. Mild acromioclavicular and glenohumeral degenerative disease noted. Imaged right lung and ribs appear normal. IMPRESSION: No acute abnormality. Mild glenohumeral and acromioclavicular degenerative change. Electronically Signed   By: Inge Rise M.D.   On: 10/19/2018 15:56   Ct Angio Chest Pe W/cm &/or Wo Cm  Result Date: 11/02/2018 CLINICAL DATA:  Chest pain and shortness of breath EXAM: CT ANGIOGRAPHY CHEST WITH CONTRAST TECHNIQUE: Multidetector CT imaging of the chest was performed using the standard protocol during bolus administration of intravenous contrast. Multiplanar CT image reconstructions and MIPs were obtained to evaluate the vascular anatomy. CONTRAST:  29mL OMNIPAQUE IOHEXOL 350 MG/ML SOLN COMPARISON:  CTA chest 01/12/2013 FINDINGS: Cardiovascular: --Pulmonary arteries: Contrast injection is sufficient to demonstrate satisfactory opacification of the pulmonary arteries to the segmental level. There is no pulmonary embolus. The main pulmonary artery is within normal limits for size. --Aorta: Limited opacification of the aorta due to bolus timing optimization for the pulmonary arteries.  Conventional 3 vessel aortic branching pattern. The aortic course and caliber are normal. There is mild aortic atherosclerosis. --Heart: Normal size. No pericardial effusion. Mediastinum/Nodes: No mediastinal, hilar or axillary lymphadenopathy. The visualized thyroid and thoracic esophageal course are unremarkable. Lungs/Pleura: No pulmonary nodules or masses. No pleural effusion or pneumothorax. No focal airspace consolidation. No focal pleural abnormality. Upper Abdomen: Contrast bolus timing is not optimized for evaluation of the abdominal organs. Large left renal cyst is incompletely visualized but measures up to 3.7 cm. There is vicarious excretion of contrast in the gallbladder, likely from the CT of the abdomen and pelvis performed 10/31/2018 Musculoskeletal: No chest wall abnormality. No acute or significant osseous findings. Review of the MIP images confirms the above findings. IMPRESSION: No pulmonary embolus or other acute thoracic abnormality. Electronically Signed   By: Ulyses Jarred M.D.   On: 11/02/2018 21:58   Ct Abdomen Pelvis W Contrast  Result Date: 10/31/2018 CLINICAL DATA:  Mid abdominal pain for 2 days. History of abdominal surgery. EXAM: CT ABDOMEN AND PELVIS WITH CONTRAST TECHNIQUE: Multidetector CT imaging of the abdomen and pelvis was performed using the  standard protocol following bolus administration of intravenous contrast. CONTRAST:  56mL ISOVUE-300 IOPAMIDOL (ISOVUE-300) INJECTION 61%, 136mL OMNIPAQUE IOHEXOL 300 MG/ML SOLN COMPARISON:  CT abdomen and pelvis January 16, 2010 FINDINGS: LOWER CHEST: RIGHT lung base scarring. Included heart size is normal. No pericardial effusion. HEPATOBILIARY: 14 mm homogeneously hypodense benign-appearing cyst LEFT lobe of the liver with a few smaller scattered hepatic cysts. Normal gallbladder. PANCREAS: Normal. SPLEEN: Normal. ADRENALS/URINARY TRACT: Kidneys are orthotopic, demonstrating symmetric enhancement. No nephrolithiasis, hydronephrosis or  solid renal masses. 3.6 cm homogeneously hypodense benign-appearing cyst interpolar LEFT kidney. Seventeen and 19 mm benign-appearing RIGHT interpolar cyst. Additional too small to characterize hypodensities bilateral kidneys. The unopacified ureters are normal in course and caliber. Delayed imaging through the kidneys demonstrates symmetric prompt contrast excretion within the proximal urinary collecting system. Urinary bladder is decompressed and unremarkable. Normal adrenal glands. STOMACH/BOWEL: The stomach, small and large bowel are normal in course and caliber without inflammatory changes. Normal appendix. VASCULAR/LYMPHATIC: Aortoiliac vessels are normal in course and caliber. Moderate calcific atherosclerosis. No lymphadenopathy by CT size criteria. 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 2.2 cm LEFT Bartholin versus Gartner cyst. OTHER: No intraperitoneal free fluid or free air. MUSCULOSKELETAL: Nonacute. Decreased size of small to moderate wide necked residual anterior abdominal wall fat containing ventral hernias. A knuckle of bowel at the hernia wall does not extend into the sac. IMPRESSION: 1. No acute intra-abdominal or pelvic process. 2. Decreased size of small to moderate fat containing ventral hernias. 3.  Aortic Atherosclerosis (ICD10-I70.0). Electronically Signed   By: Elon Alas M.D.   On: 10/31/2018 03:49   US Venous Img Lower Unilateral Right  Result Date: 10/19/2018 CLINICAL DATA:  Right-sided knee pain for several weeks EXAM: RIGHT LOWER EXTREMITY VENOUS DOPPLER ULTRASOUND TECHNIQUE: Gray-scale sonography with graded compression, as well as color Doppler and duplex ultrasound were performed to evaluate the lower extremity deep venous systems from the level of the common femoral vein and including the common femoral, femoral, profunda femoral, popliteal and calf veins including the posterior tibial, peroneal and gastrocnemius veins when visible. The  superficial great saphenous vein was also interrogated. Spectral Doppler was utilized to evaluate flow at rest and with distal augmentation maneuvers in the common femoral, femoral and popliteal veins. COMPARISON:  None. FINDINGS: Contralateral Common Femoral Vein: Respiratory phasicity is normal and symmetric with the symptomatic side. No evidence of thrombus. Normal compressibility. Common Femoral Vein: No evidence of thrombus. Normal compressibility, respiratory phasicity and response to augmentation. Saphenofemoral Junction: No evidence of thrombus. Normal compressibility and flow on color Doppler imaging. Profunda Femoral Vein: No evidence of thrombus. Normal compressibility and flow on color Doppler imaging. Femoral Vein: No evidence of thrombus. Normal compressibility, respiratory phasicity and response to augmentation. Popliteal Vein: Some decreased flow is noted within the popliteal vein although normal compressibility is seen and no thrombus is noted. Calf Veins: No evidence of thrombus. Normal compressibility and flow on color Doppler imaging. Superficial Great Saphenous Vein: No evidence of thrombus. Normal compressibility. Venous Reflux:  None. Other Findings:  None. IMPRESSION: No evidence of deep venous thrombosis. Mild slow flow is noted within the popliteal vein. Electronically Signed   By: Inez Catalina M.D.   On: 10/19/2018 16:28   Disposition   Pt is being discharged home today in good condition.  Follow-up Plans & Appointments    Follow-up [facility] Follow up on 11/10/2018.   Why:  9:15 for Telehealth follow up Contact information:  [address] [place]       Troy Sine, MD Follow up.   Specialty:  Cardiology Why:  The office will call you to set up a telehealth visit with Dr. Claiborne Billings or one of this NP/PAs. please refer to the instructions if you discharge summary on how to set up a telehealth  visit Contact information: [address] [place] [address] [phone]          Discharge Instructions    Amb Referral to Cardiac Rehabilitation   Complete by:  As directed    Diagnosis:  NSTEMI      Discharge Medications   Allergies as of 11/08/2018      Reactions   Tramadol    shaking   Vicodin [hydrocodone-acetaminophen] Other (See Comments)   Shaking       Medication List    STOP taking these medications   ibuprofen 200 MG tablet Commonly known as:  ADVIL,MOTRIN     TAKE these medications   acetaminophen 325 MG tablet Commonly known as:  TYLENOL Take 650 mg by mouth every 6 (six) hours as needed (for pain.).   aspirin 81 MG EC tablet Take 1 tablet (81 mg total) by mouth daily. Start taking on:  November 09, 2018   atorvastatin 40 MG tablet Commonly known as:  LIPITOR Take 1 tablet (40 mg total) by mouth daily at 6 PM.   clopidogrel 75 MG tablet Commonly known as:  PLAVIX Take 1 tablet (75 mg total) by mouth daily with breakfast. Start taking on:  November 09, 2018   diclofenac sodium 1 % Gel Commonly known as:  Voltaren Apply 2 g topically 4 (four) times daily as needed. What changed:  additional instructions   nitroGLYCERIN 0.4 MG SL tablet Commonly known as:  NITROSTAT Place 1 tablet (0.4 mg total) under the tongue every 5 (five) minutes x 3 doses as needed for chest pain.        Acute coronary syndrome (MI, NSTEMI, STEMI, etc) this admission?: Yes.     AHA/ACC Clinical Performance & Quality Measures: 1. Aspirin prescribed? - Yes 2. ADP Receptor Inhibitor (Plavix/Clopidogrel, Brilinta/Ticagrelor or Effient/Prasugrel) prescribed (includes medically managed patients)? - Yes 3. Beta Blocker prescribed? - No - hypotension 4. High Intensity Statin (Lipitor 40-80mg  or Crestor 20-40mg ) prescribed? - Yes 5. EF assessed during THIS hospitalization? - Yes 6. For EF <40%, was ACEI/ARB prescribed? - Not Applicable (EF >/= 97%) 7. For EF  <40%, Aldosterone Antagonist (Spironolactone or Eplerenone) prescribed? - Not Applicable (EF >/= 94%) 8. Cardiac Rehab Phase II ordered (Included Medically managed Patients)? - Yes     Outstanding Labs/Studies   none  Duration of Discharge Encounter   Greater than 30 minutes including physician time.  Signed, Angelena Form, PA-C 11/08/2018, 11:50 AM

## 2018-11-08 NOTE — Progress Notes (Signed)
Progress Note  Patient Name: Vanessa Campos Date of Encounter: 11/08/2018  Primary Cardiologist: Shelva Majestic, MD   Subjective   No complaints  Inpatient Medications    Scheduled Meds: . aspirin EC  81 mg Oral Daily  . atorvastatin  40 mg Oral q1800  . clopidogrel  75 mg Oral Q breakfast  . heparin  5,000 Units Subcutaneous Q8H  . sodium chloride flush  3 mL Intravenous Once   Continuous Infusions: . sodium chloride    . sodium chloride 50 mL/hr at 11/07/18 1239  . nitroGLYCERIN Stopped (11/04/18 1111)   PRN Meds: sodium chloride, docusate sodium, nitroGLYCERIN, ondansetron (ZOFRAN) IV   Vital Signs    Vitals:   11/07/18 1635 11/07/18 1802 11/07/18 2135 11/08/18 0507  BP: (!) 82/73 (!) 84/49 (!) 86/58 (!) 89/59  Pulse:   65 63  Resp:      Temp:   98.3 F (36.8 C) 98.5 F (36.9 C)  TempSrc:   Oral Oral  SpO2:   95% 99%  Weight:    63 kg  Height:        Intake/Output Summary (Last 24 hours) at 11/08/2018 0805 Last data filed at 11/08/2018 0521 Gross per 24 hour  Intake 747.38 ml  Output 1000 ml  Net -252.62 ml   Last 3 Weights 11/08/2018 11/07/2018 11/06/2018  Weight (lbs) 139 lb 138 lb 1.6 oz 138 lb  Weight (kg) 63.05 kg 62.642 kg 62.596 kg      Telemetry    SR, 1st degree AV block, Desiree Hane  - Personally Reviewed  ECG    na - Personally Reviewed  Physical Exam   GEN: No acute distress.   Neck: No JVD Cardiac: RRR, no murmurs, rubs, or gallops.  Respiratory: Clear to auscultation bilaterally. GI: Soft, nontender, non-distended  MS: No edema; No deformity. Neuro:  Nonfocal  Psych: Normal affect   Labs    Chemistry Recent Labs  Lab 11/02/18 2057 11/03/18 0723 11/05/18 1120  NA 129* 131* 133*  K 3.3* 3.5 3.6  CL 93* 95* 99  CO2 26 25 21*  GLUCOSE 154* 116* 110*  BUN 13 9 10   CREATININE 0.89 0.76 0.80  CALCIUM 9.1 8.7* 8.9  GFRNONAA >60 >60 >60  GFRAA >60 >60 >60  ANIONGAP 10 11 13      Hematology Recent Labs  Lab 11/06/18  0414 11/07/18 0526 11/08/18 0307  WBC 8.4 7.1 7.0  RBC 4.80 4.53 4.47  HGB 13.1 12.7 12.3  HCT 39.8 37.5 37.0  MCV 82.9 82.8 82.8  MCH 27.3 28.0 27.5  MCHC 32.9 33.9 33.2  RDW 13.5 13.4 13.4  PLT 332 350 334    Cardiac Enzymes Recent Labs  Lab 11/02/18 2057 11/03/18 0831 11/03/18 1413 11/03/18 2110  TROPONINI 7.06* 7.42* 6.35* 6.66*   No results for input(s): TROPIPOC in the last 168 hours.   BNPNo results for input(s): BNP, PROBNP in the last 168 hours.   DDimer No results for input(s): DDIMER in the last 168 hours.   Radiology    No results found.  Cardiac Studies     Patient Profile     68 y.o.femalewith PMH of asthma, COPD, DVT, GERD, HTN, HL who presented to [facility] with chest pain and found to have a positive troponin. Transferred to [facility] for further work up.  Assessment & Plan    1. NSTEMI - Peak troponin 7.42.Underwent cardiac cath noted above with 3v distal disease to the dLAD, LCx and RPA-subtotal to totally occluded  suggestive of SCAD. All disease is small vessel and too small for PCI. Low LVEDP. - 10/2018 echo LVEF 85-27%, grade I diastolic dysfunction, apical akinesis - she reports a niece passed just a few days ago, with apical hypokinesis unclear if could be component of Takotsubo CM or if WMA is due to her distal LAD disease.   - medical therapy with ASA, atorva 40, plavix 75. Has not started beta blocker due to low bp's and 1st degree AV block, no ACE due to low bp's. Reconsider at outpatient follow up.    2. Hypovolemia/Hypotension - low LVEDP by cath 2-5. Hyponatremic on admission. She does report some N/V/D prior to admission that has resolved - has had low bp's at times. She reports chornic SBPs in the 90s, however chart review shows SBP 120s-130s.  - has been on gentle IVFs conservative rate 57mL/hr overnight. Bolus 561mL today, bp remans low. Post procedure Hgb normal.   3. 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    Plan for discharge later today after additional IVFs  For questions or updates, please contact [place] Please consult [email or web address] for contact info under        Signed, Carlyle Dolly, MD  11/08/2018, 8:05 AM

## 2018-11-08 NOTE — Discharge Instructions (Signed)
Radial Site Care Refer to this sheet in the next few weeks. These instructions provide you with information on caring for yourself after your procedure. Your caregiver may also give you more specific instructions. Your treatment has been planned according to current medical practices, but problems sometimes occur. Call your caregiver if you have any problems or questions after your procedure. HOME CARE INSTRUCTIONS  You may shower the day after the procedure.Remove the bandage (dressing) and gently wash the site with plain soap and water.Gently pat the site dry.   Do not apply powder or lotion to the site.   Do not submerge the affected site in water for 3 to 5 days.   Inspect the site at least twice daily.   Do not flex or bend the affected arm for 24 hours.   No lifting over 5 pounds (2.3 kg) for 5 days after your procedure.   Do not drive home if you are discharged the same day of the procedure. Have someone else drive you.   You may drive 24 hours after the procedure unless otherwise instructed by your caregiver.  What to expect:  Any bruising will usually fade within 1 to 2 weeks.   Blood that collects in the tissue (hematoma) may be painful to the touch. It should usually decrease in size and tenderness within 1 to 2 weeks.  SEEK IMMEDIATE MEDICAL CARE IF:  You have unusual pain at the radial site.   You have redness, warmth, swelling, or pain at the radial site.   You have drainage (other than a small amount of blood on the dressing).   You have chills.   You have a fever or persistent symptoms for more than 72 hours.   You have a fever and your symptoms suddenly get worse.   Your arm becomes pale, cool, tingly, or numb.   You have heavy bleeding from the site. Hold pressure on the site.    YOUR CARDIOLOGY TEAM HAS ARRANGED FOR AN E-VISIT FOR YOUR APPOINTMENT - PLEASE REVIEW IMPORTANT INFORMATION BELOW SEVERAL DAYS PRIOR TO YOUR APPOINTMENT  Due to the recent  COVID-19 pandemic, we are transitioning in-person office visits to tele-medicine visits in an effort to decrease unnecessary exposure to our patients and staff. Medicare and most insurances are covering these visits without a copay needed. We also encourage you to sign up for MyChart if you have not already done so. You will need a smartphone if possible. For patients that do not have this, we can still complete the visit using a regular telephone but do prefer a smartphone to enable video when possible. You may have a close family member that lives with you that can help. If possible, we also ask that you have a blood pressure cuff and scale at home to measure your blood pressure, heart rate and weight prior to your scheduled appointment. Patients with clinical needs that need an in-person evaluation and testing will still be able to come to the office if absolutely necessary. If you have any questions, feel free to call our office.    IF YOU HAVE A SMARTPHONE, PLEASE DOWNLOAD THE WEBEX APP TO YOUR SMARTPHONE  - If Apple, go to [employer] and type in WebEx in the search bar. [employer], the blue/green circle. The app is free but as with any other app download, your phone may require you to verify saved payment information or Apple password. You do NOT have to create a WebEx account.  -  If Android, go to [employer] and type in [employer] in the search bar. [place] [employer], the blue/green circle. The app is free but as with any other app download, your phone may require you to verify saved payment information or Android password. You do NOT have to create a WebEx account.  It is very helpful to have this downloaded before your visit.    2-3 DAYS BEFORE YOUR APPOINTMENT  You will receive a telephone call from one of our [place] team members - your caller ID may say "Unknown caller." If this is a video visit, we will confirm that you have been able to download the  WebEx app. We will remind you check your blood pressure, heart rate and weight prior to your scheduled appointment. If you have an Apple Watch or Kardia, please upload any pertinent ECG strips the day before or morning of your appointment to [place]. Our staff will also make sure you have reviewed the consent and agree to move forward with your scheduled tele-health visit.     THE DAY OF YOUR APPOINTMENT  Approximately 15 minutes prior to your scheduled appointment, you will receive a telephone call from one of [place] team - your caller ID may say "Unknown caller."  Our staff will confirm medications, vital signs for the day and any symptoms you may be experiencing. Please have this information available prior to the time of visit start. It may also be helpful for you to have a pad of paper and pen handy for any instructions given during your visit. They will also walk you through joining the WebEx smartphone meeting if this is a video visit.    CONSENT FOR TELE-HEALTH VISIT - PLEASE REVIEW  I hereby voluntarily request, consent and authorize [facility] and its employed or contracted physicians, physician assistants, nurse practitioners or other licensed health care professionals (the Practitioner), to provide me with telemedicine health care services (the Services") as deemed necessary by the treating Practitioner. I acknowledge and consent to receive the Services by the Practitioner via telemedicine. I understand that the telemedicine visit will involve communicating with the Practitioner through live audiovisual communication technology and the disclosure of certain medical information by electronic transmission. I acknowledge that I have been given the opportunity to request an in-person assessment or other available alternative prior to the telemedicine visit and am voluntarily participating in the telemedicine visit.  I understand that I have the right to withhold or withdraw my consent  to the use of telemedicine in the course of my care at any time, without affecting my right to future care or treatment, and that the Practitioner or I may terminate the telemedicine visit at any time. I understand that I have the right to inspect all information obtained and/or recorded in the course of the telemedicine visit and may receive copies of available information for a reasonable fee.  I understand that some of the potential risks of receiving the Services via telemedicine include:   Delay or interruption in medical evaluation due to technological equipment failure or disruption;  Information transmitted may not be sufficient (e.g. poor resolution of images) to allow for appropriate medical decision making by the Practitioner; and/or   In rare instances, security protocols could fail, causing a breach of personal health information.  Furthermore, I acknowledge that it is my responsibility to provide information about my medical history, conditions and care that is complete and accurate to the best of my ability. I acknowledge that Practitioner's  advice, recommendations, and/or decision may be based on factors not within their control, such as incomplete or inaccurate data provided by me or distortions of diagnostic images or specimens that may result from electronic transmissions. I understand that the practice of medicine is not an exact science and that Practitioner makes no warranties or guarantees regarding treatment outcomes. I acknowledge that I will receive a copy of this consent concurrently upon execution via email to the email address I last provided but may also request a printed copy by calling the office of [facility].    I understand that my insurance will be billed for this visit.   I have read or had this consent read to me.  I understand the contents of this consent, which adequately explains the benefits and risks of the Services being provided via telemedicine.   I  have been provided ample opportunity to ask questions regarding this consent and the Services and have had my questions answered to my satisfaction.  I give my informed consent for the services to be provided through the use of telemedicine in my medical care  By participating in this telemedicine visit I agree to the above.

## 2018-11-10 ENCOUNTER — Other Ambulatory Visit: Payer: Self-pay

## 2018-11-10 ENCOUNTER — Telehealth: Payer: Medicare Other

## 2018-11-11 ENCOUNTER — Telehealth: Payer: Self-pay | Admitting: Cardiovascular Disease

## 2018-11-11 ENCOUNTER — Telehealth: Payer: Self-pay | Admitting: *Deleted

## 2018-11-11 NOTE — Telephone Encounter (Signed)
Duplicate not needed 

## 2018-11-11 NOTE — Telephone Encounter (Signed)
Patient contacted regarding discharge from 11/02/2018 - 11/08/2018 (6 days) [facility]  Patient understands to follow up with providerTOC appt scheduled for 04/22 at 2pm w/TK.   Patient understands discharge instructions? NO Patient understands medications and regiment? NO Patient understands to bring all medications to this (VIDEO)visit? YES  PT/DAUGHTER WILL HAVE VITAL SIGNS AND MEDICATIONS AVAILABLE AT TIME OF VISIT

## 2018-11-11 NOTE — Telephone Encounter (Deleted)
-----   Message from Katrine Coho, RN sent at 11/11/2018 11:58 AM EDT ----- Regarding: RE: TOC and E visit Thank you! Anne ----- Message ----- From: Rivka Barbara Sent: 11/11/2018  10:54 AM EDT To: Katrine Coho, RN Subject: RE: Donella Stade and E visit                            Patient has been scheduled ----- Message ----- From: Katrine Coho, RN Sent: 11/10/2018   7:16 AM EDT To: Katrine Coho, RN, [facility] Scheduling Subject: Melton Alar: TOC and E visit                             ----- Message ----- From: Eileen Stanford, PA-C Sent: 11/08/2018  11:51 AM EDT To: [facility] Toc Subject: TOC and E visit                                Hello! She needs a 1-2 week TOC visit and TOC call. She will be a new pt of Dr. Claiborne Billings. Thank you  KT

## 2018-11-11 NOTE — Telephone Encounter (Signed)
New Message    Patient has a TOC appt scheduled for 04/22 at 2pm.

## 2018-11-17 ENCOUNTER — Telehealth: Payer: Self-pay | Admitting: Cardiovascular Disease

## 2018-11-17 NOTE — Telephone Encounter (Signed)
LMTCB to change visit to virtual. Also sent message to [place].

## 2018-11-18 ENCOUNTER — Telehealth: Payer: Self-pay | Admitting: Cardiovascular Disease

## 2018-11-18 NOTE — Telephone Encounter (Signed)
Mychart, no smartphone, pre reg complete 11/18/18 AF

## 2018-11-19 ENCOUNTER — Encounter: Payer: Self-pay | Admitting: Cardiovascular Disease

## 2018-11-19 ENCOUNTER — Telehealth (INDEPENDENT_AMBULATORY_CARE_PROVIDER_SITE_OTHER): Payer: Medicare Other | Admitting: Cardiovascular Disease

## 2018-11-19 VITALS — BP 113/73 | HR 73 | Ht 63.0 in | Wt 144.6 lb

## 2018-11-19 DIAGNOSIS — I2542 Coronary artery dissection: Secondary | ICD-10-CM | POA: Diagnosis not present

## 2018-11-19 DIAGNOSIS — I214 Non-ST elevation (NSTEMI) myocardial infarction: Secondary | ICD-10-CM

## 2018-11-19 DIAGNOSIS — R0789 Other chest pain: Secondary | ICD-10-CM

## 2018-11-19 DIAGNOSIS — J449 Chronic obstructive pulmonary disease, unspecified: Secondary | ICD-10-CM

## 2018-11-19 DIAGNOSIS — E785 Hyperlipidemia, unspecified: Secondary | ICD-10-CM | POA: Diagnosis not present

## 2018-11-19 DIAGNOSIS — F17210 Nicotine dependence, cigarettes, uncomplicated: Secondary | ICD-10-CM | POA: Diagnosis not present

## 2018-11-19 DIAGNOSIS — Z720 Tobacco use: Secondary | ICD-10-CM

## 2018-11-19 MED ORDER — ISOSORBIDE MONONITRATE ER 30 MG PO TB24: ORAL_TABLET | ORAL | 3 refills | Status: AC

## 2018-11-19 NOTE — Progress Notes (Signed)
Virtual Visit via Telephone Note   This visit type was conducted due to national recommendations for restrictions regarding the COVID-19 Pandemic (e.g. social distancing) in an effort to limit this patient's exposure and mitigate transmission in our community.  Due to her co-morbid illnesses, this patient is at least at moderate risk for complications without adequate follow up.  This format is felt to be most appropriate for this patient at this time.  The patient did not have access to video technology/had technical difficulties with video requiring transitioning to audio format only (telephone).  All issues noted in this document were discussed and addressed.  No physical exam could be performed with this format.  Please refer to the patient's chart for her  consent to telehealth for [facility].   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 female who was recently admitted to [facility] from April 5 through November 08, 2018.  Prior to her admission, a niece had died the week previously.  Presented to [facility] on November 02, 2018 with abdominal pain diarrhea chest pain.  Troponin was elevated at 7.06.  A COVID 19 evaluation was negative but this required several days for the results to be available.  She ultimately was transferred out of 2 W to cardiac telemetry.  Echo Doppler study showed an EF of 55 to 60% with grade 1 diastolic dysfunction, but also showed apical  Akinesis. Due to her persistently elevated troponins with a plateau curve and apical wall motion abnormality, definitive cardiac  catheterization was recommended.  This was done Dr. Ellyn Hack and revealed distal pruning of her LAD circumflex and RPDA highly suggestive of SCAD.  Blood pressure was low and her LVEDP was low at 5 mm Hg.  Her heparin was discontinued.  She was started on aspirin and Plavix, and was hydrated.  Found to have significant first-degree heart block and had episodes of winky block which ultimately responded solved.  He was discharged on April 11 blocker therapy but on aspirin/Plavix/atorvastatin.  Since hospital discharge, she has started to feel much better.  At times there is some mild shortness of breath.  There are no fevers.  She denies any periods of dizziness.  There is no syncope.  At times she notes a vague chest pressure.  Of note, during her hospitalization cholesterol was 215 with LDL cholesterol 142.  She presents for initial post hospital evaluation.  The patient does not have symptoms concerning for COVID-19 infection (fever, chills, cough, or new shortness of breath).    Past Medical History:  Diagnosis Date   Asthma    COPD (chronic obstructive pulmonary disease) ([place])    Coronary artery disease    DVT (deep venous thrombosis) ([place]) 2014   provoked, treated with 6 months warfarin   GERD (gastroesophageal reflux disease)    Hyperlipidemia    Hypertension    Tobacco abuse    Past Surgical History:  Procedure Laterality Date   ABDOMINAL SURGERY     CARPAL TUNNEL RELEASE     LEFT HEART CATH AND CORONARY ANGIOGRAPHY N/A 11/05/2018   Procedure: LEFT HEART CATH AND  CORONARY ANGIOGRAPHY;  Surgeon: Leonie Man, MD;  Location: [place] [facility];  Service: Cardiovascular;  Laterality: N/A;     Current Meds  Medication Sig   acetaminophen (TYLENOL) 325 MG tablet Take 650 mg by mouth every 6 (six) hours as needed (for pain.).   aspirin EC 81 MG EC tablet Take 1 tablet (81 mg total) by mouth daily.   atorvastatin (LIPITOR) 40 MG tablet Take 1 tablet (40 mg total) by mouth  daily at 6 PM.   clopidogrel (PLAVIX) 75 MG tablet Take 1 tablet (75 mg total) by mouth daily with breakfast.   diclofenac sodium (VOLTAREN) 1 % GEL Apply 2 g topically 4 (four) times daily as needed. (Patient taking differently: Apply 2 g topically 4 (four) times daily as needed. Pain.)   nitroGLYCERIN (NITROSTAT) 0.4 MG SL tablet Place 1 tablet (0.4 mg total) under the tongue every 5 (five) minutes x 3 doses as needed for chest pain.     Allergies:   Tramadol and Vicodin [hydrocodone-acetaminophen]   Social History   Tobacco Use   Smoking status: Current Every Day Smoker    Packs/day: 1.00    Years: 40.00    Pack years: 40.00    Types: Cigarettes   Smokeless tobacco: Never Used  Substance Use Topics   Alcohol use: No    Alcohol/week: 0.0 standard drinks   Drug use: No     Family Hx: The patient's family history is not on file.  ROS:   Please see the history of present illness.    History of long term tobacco use with probable COPD Resolution of prior abdominal pain nausea No recent cough No fevers post hospitalization Denies any bleeding following institution of aspirin/Plavix All other systems reviewed and are negative.   Prior CV studies:   The following studies were reviewed today:  Echo 11/04/18 IMPRESSIONS 1. The left ventricle has normal systolic function, with an ejection fraction of 55-60%. The cavity size was normal. There is mildly increased left ventricular wall thickness. Left ventricular diastolic Doppler parameters are consistent with impaired  relaxation. There is akinesis of the LV apex. Remainder of the wall segments contract normally. 2. The right ventricle has normal systolic function. The cavity was normal. There is no increase in right ventricular wall thickness. 3. Mild calcification of the mitral valve leaflet. No evidence of mitral valve stenosis. Trivial mitral regurgitation. 4. The aortic valve is tricuspid. Mild calcification of the  aortic valve. No stenosis of the aortic valve. 5. The aortic root is normal in size and structure. 6. The IVC was normal in size. PA systolic pressure 31 mmHg.  FINDINGS Left Ventricle: The left ventricle has normal systolic function, with an ejection fraction of 55-60%. The cavity size was normal. There is mildly increased left ventricular wall thickness. Left ventricular diastolic Doppler parameters are consistent  with impaired relaxation. Right Ventricle: The right ventricle has normal systolic function. The cavity was normal. There is no increase in right ventricular wall thickness. Left Atrium: Left atrial size was normal in size. Right Atrium: Right atrial size was normal in size. Right atrial pressure is estimated at 10 mmHg. Interatrial Septum: No atrial level shunt detected by color flow Doppler. Pericardium: There is no evidence of pericardial effusion. Mitral Valve: The mitral valve is normal in structure. Mild calcification of the mitral valve leaflet. Mitral valve regurgitation is trivial by color flow Doppler. No evidence of mitral valve stenosis. Tricuspid Valve: The tricuspid valve is normal in structure.  Tricuspid valve regurgitation is trivial by color flow Doppler. Aortic Valve: The aortic valve is tricuspid Mild calcification of the aortic valve. Aortic valve regurgitation was not visualized by color flow Doppler. There is No stenosis of the aortic valve. Pulmonic Valve: The pulmonic valve was normal in structure. Pulmonic valve regurgitation is not visualized by color flow Doppler. Aorta: The aortic root is normal in size and structure. Venous: The inferior vena cava is normal in size with greater than 50% respiratory variability.   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  Low LVEDP with systemic hypo-tension.  RECOMMENDATIONS  She will return to her nursing unit for ongoing care  Stop IV heparin as this appears to be more consistent with SCAD -we will simply start Plavix with loading today.  Pending blood pressure stabilization, may want to consider long-acting nitrate  Otherwise risk factor modifications per primary cardiology team  Labs/Other Tests and Data Reviewed:    EKG: Personally reviewed her ECG the date of discharge from November 08, 2018 which showed normal sinus rhythm at 65.  There was improvement in her first-degree heart block and PR interval at discharge was 240 ms.  There was a mild RV conduction delay.  No ectopy.  Recent Labs: 10/31/2018: ALT 15 11/03/2018: TSH 1.803 11/05/2018: BUN 10; Creatinine, Ser 0.80; Potassium 3.6; Sodium 133 11/08/2018: Hemoglobin 12.3; Platelets 334   Recent Lipid Panel Lab Results  Component Value Date/Time   CHOL 215 (H) 11/04/2018 04:34 AM   TRIG 83 11/04/2018 04:34 AM   HDL 56 11/04/2018 04:34 AM   CHOLHDL 3.8 11/04/2018 04:34 AM   LDLCALC 142 (H) 11/04/2018 04:34 AM    Wt Readings from Last 3 Encounters:  11/19/18 144 lb 9.6 oz (65.6 kg)  11/08/18 139 lb (63 kg)  10/31/18 150 lb (68 kg)     Objective:    Vital Signs:  BP 113/73    Pulse 73    Ht 5\' 3"  (1.6 m)    Wt 144 lb 9.6 oz (65.6 kg)    BMI 25.61 kg/m    Her breathing was normal and not labored. She did not state that there was any change in her physical appearance for when she was seen in the office. There was no audible wheezing. He did not have chest pressure to palpation of her chest.  She denied any abdominal discomfort or tenderness to palpation.  She denied any leg swelling. She had normal affect  ASSESSMENT & PLAN:    1. 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  She appeared to  have vessel pruning of her distal vessels including LAD circumflex collateral vessels which were very small in caliber.  Medical therapy is recommended.  She was started on aspirin and Plavix.  He has experienced some episodes of subtle chest tightness since.  I am electing to add low-dose isosorbide mononitrate 15 mg for the next 4 days.  As long as her blood pressure remains greater than 100 she can titrate this to 30 mg. 2. Apical akinesis on echo Doppler study, consistent with distal vessel spontaneous coronary artery dissection.  Alternatively, there was some consideration for possible Takotsubo cardiomyopathy particularly with the recent death of her  niece 3. Longstanding tobacco history.  We discussed the importance of complete smoking cessation 4. First degree AV block previous [place] block noted during her hospitalization: Probably underlying conduction disease.  Her interval improved to 240 ms at discharge. 5. Hyperlipidemia with target LDL less than 70: Total cholesterol 215, LDL 142, started on atorvastatin.  COVID-19 Education: The signs and symptoms of COVID-19 were discussed with the patient and how to seek care for testing (follow up with PCP or arrange E-visit).  The importance of social distancing was discussed today.  Time:   Today, I have spent 28 minutes with the patient with telehealth technology discussing the above problems.     Medication Adjustments/Labs and Tests Ordered: Current medicines are reviewed at length with the patient today.  Concerns regarding medicines are outlined above.   Tests Ordered: No orders of the defined types were placed in this encounter.   Medication Changes: No orders of the defined types were placed in this encounter.   Disposition:  Follow up 2 months.  Signed, Shelva Majestic, MD  11/19/2018 2:19 PM    [facility]

## 2018-11-19 NOTE — Patient Instructions (Signed)
Medication Instructions:  Start Isosorbide 30 mg (take 0.5 tablet, 15 mg for 4 days, as long as BP is greater than 100 and symptoms stay the same, increase to the entire 30 mg daily)  If you need a refill on your cardiac medications before your next appointment, please call your pharmacy.   Lab work: CMET, LIPID 2 months If you have labs (blood work) drawn today and your tests are completely normal, you will receive your results only by: Marland Kitchen MyChart Message (if you have MyChart) OR . A paper copy in the mail If you have any lab test that is abnormal or we need to change your treatment, we will call you to review the results.  Follow-Up: At [facility], you and your health needs are our priority.  As part of our continuing mission to provide you with exceptional heart care, we have created designated Provider Care Teams.  These Care Teams include your primary Cardiologist (physician) and Advanced Practice Providers (APPs -  Physician Assistants and Nurse Practitioners) who all work together to provide you with the care you need, when you need it. You will need a follow up appointment in 2 months.  You may see Shelva Majestic, MD or one of the following Advanced Practice Providers on your designated Care Team: [place], [place] . Fabian Sharp, PA-C   I will mail you the order for the BP Machine to see if insurance will pay.

## 2018-11-25 ENCOUNTER — Telehealth (HOSPITAL_COMMUNITY): Payer: Self-pay | Admitting: *Deleted

## 2018-11-25 NOTE — Telephone Encounter (Signed)
Called and left message for pt. In regards to referral sent to [facility] for participation.  Inquired for well being and our continued closure due adherence of national recommendation for Covid-19 .  Indicated will send a packet of education materials on heart healthy diet and exercise she can do in the interim. Requested call back for interest. Call back number provided. Cherre Huger, BSN Cardiac and Training and development officer

## 2018-12-15 DIAGNOSIS — M79604 Pain in right leg: Secondary | ICD-10-CM | POA: Diagnosis not present

## 2018-12-15 DIAGNOSIS — M25511 Pain in right shoulder: Secondary | ICD-10-CM | POA: Diagnosis not present

## 2018-12-15 DIAGNOSIS — G8929 Other chronic pain: Secondary | ICD-10-CM | POA: Diagnosis not present

## 2018-12-15 DIAGNOSIS — F419 Anxiety disorder, unspecified: Secondary | ICD-10-CM | POA: Diagnosis not present

## 2018-12-15 DIAGNOSIS — F5101 Primary insomnia: Secondary | ICD-10-CM | POA: Diagnosis not present

## 2018-12-15 DIAGNOSIS — E785 Hyperlipidemia, unspecified: Secondary | ICD-10-CM | POA: Diagnosis not present

## 2018-12-15 DIAGNOSIS — M25512 Pain in left shoulder: Secondary | ICD-10-CM | POA: Diagnosis not present

## 2018-12-15 DIAGNOSIS — I25119 Atherosclerotic heart disease of native coronary artery with unspecified angina pectoris: Secondary | ICD-10-CM | POA: Diagnosis not present

## 2019-01-14 ENCOUNTER — Telehealth (HOSPITAL_COMMUNITY): Payer: Self-pay

## 2019-01-14 NOTE — Telephone Encounter (Signed)

## 2019-01-15 DIAGNOSIS — M79604 Pain in right leg: Secondary | ICD-10-CM | POA: Diagnosis not present

## 2019-01-15 DIAGNOSIS — M79605 Pain in left leg: Secondary | ICD-10-CM | POA: Diagnosis not present

## 2019-01-15 DIAGNOSIS — F419 Anxiety disorder, unspecified: Secondary | ICD-10-CM | POA: Diagnosis not present

## 2019-01-16 ENCOUNTER — Encounter (HOSPITAL_COMMUNITY): Payer: Self-pay

## 2019-01-16 DIAGNOSIS — M79604 Pain in right leg: Secondary | ICD-10-CM | POA: Diagnosis not present

## 2019-01-16 DIAGNOSIS — M79605 Pain in left leg: Secondary | ICD-10-CM | POA: Diagnosis not present

## 2019-01-17 DIAGNOSIS — M79604 Pain in right leg: Secondary | ICD-10-CM | POA: Diagnosis not present

## 2019-01-17 DIAGNOSIS — M79605 Pain in left leg: Secondary | ICD-10-CM | POA: Diagnosis not present

## 2019-01-19 ENCOUNTER — Telehealth (HOSPITAL_COMMUNITY): Payer: Self-pay | Admitting: *Deleted

## 2019-01-19 DIAGNOSIS — H43812 Vitreous degeneration, left eye: Secondary | ICD-10-CM | POA: Diagnosis not present

## 2019-01-19 DIAGNOSIS — H524 Presbyopia: Secondary | ICD-10-CM | POA: Diagnosis not present

## 2019-01-19 DIAGNOSIS — H2589 Other age-related cataract: Secondary | ICD-10-CM | POA: Diagnosis not present

## 2019-01-19 DIAGNOSIS — H538 Other visual disturbances: Secondary | ICD-10-CM | POA: Diagnosis not present

## 2019-01-19 DIAGNOSIS — H5703 Miosis: Secondary | ICD-10-CM | POA: Diagnosis not present

## 2019-01-19 DIAGNOSIS — H25011 Cortical age-related cataract, right eye: Secondary | ICD-10-CM | POA: Diagnosis not present

## 2019-01-19 DIAGNOSIS — H2511 Age-related nuclear cataract, right eye: Secondary | ICD-10-CM | POA: Diagnosis not present

## 2019-01-19 DIAGNOSIS — H4321 Crystalline deposits in vitreous body, right eye: Secondary | ICD-10-CM | POA: Diagnosis not present

## 2019-01-19 DIAGNOSIS — H25041 Posterior subcapsular polar age-related cataract, right eye: Secondary | ICD-10-CM | POA: Diagnosis not present

## 2019-01-19 DIAGNOSIS — H5201 Hypermetropia, right eye: Secondary | ICD-10-CM | POA: Diagnosis not present

## 2019-01-19 NOTE — Telephone Encounter (Signed)
Called to reschedule patient due to a scheduling conflict for Virtual Cardiac and Pulmonary Rehab.  She was rescheduled for Wednesday, January 21, 2019 @ 2pm.

## 2019-01-20 ENCOUNTER — Encounter (HOSPITAL_COMMUNITY): Payer: Self-pay

## 2019-01-21 ENCOUNTER — Telehealth (HOSPITAL_COMMUNITY): Payer: Self-pay

## 2019-01-21 ENCOUNTER — Inpatient Hospital Stay (HOSPITAL_COMMUNITY)
Admission: RE | Admit: 2019-01-21 | Discharge: 2019-01-21 | Disposition: A | Discharge status: Home / Self Care | Payer: Self-pay | Source: Ambulatory Visit

## 2019-01-21 NOTE — Telephone Encounter (Signed)
Phoned pt to set up [employer] app for Virtual Cardiac Rehab. Pt did not answer. Left pt VM.  Will follow up to see if pt is still interested in participating in app.   Carma Lair MS, ACSM CEP 2:25 PM 01/21/2019

## 2019-01-22 ENCOUNTER — Other Ambulatory Visit: Payer: Self-pay

## 2019-01-22 DIAGNOSIS — I214 Non-ST elevation (NSTEMI) myocardial infarction: Secondary | ICD-10-CM

## 2019-01-23 LAB — COMPREHENSIVE METABOLIC PANEL
ALT: 15 IU/L (ref 0–32)
AST: 18 IU/L (ref 0–40)
Albumin/Globulin Ratio: 1.9 (ref 1.2–2.2)
Albumin: 4.1 g/dL (ref 3.8–4.8)
Alkaline Phosphatase: 72 IU/L (ref 39–117)
BUN/Creatinine Ratio: 13 (ref 12–28)
BUN: 10 mg/dL (ref 8–27)
Bilirubin Total: 0.4 mg/dL (ref 0.0–1.2)
CO2: 25 mmol/L (ref 20–29)
Calcium: 8.9 mg/dL (ref 8.7–10.3)
Chloride: 106 mmol/L (ref 96–106)
Creatinine, Ser: 0.78 mg/dL (ref 0.57–1.00)
GFR calc Af Amer: 92 mL/min/{1.73_m2} (ref >59)
GFR calc non Af Amer: 79 mL/min/{1.73_m2} (ref >59)
Globulin, Total: 2.2 g/dL (ref 1.5–4.5)
Glucose: 81 mg/dL (ref 65–99)
Potassium: 4.3 mmol/L (ref 3.5–5.2)
Sodium: 144 mmol/L (ref 134–144)
Total Protein: 6.3 g/dL (ref 6.0–8.5)

## 2019-01-23 LAB — LIPID PANEL
Chol/HDL Ratio: 2.4 ratio (ref 0.0–4.4)
Cholesterol, Total: 176 mg/dL (ref 100–199)
HDL: 72 mg/dL (ref >39)
LDL Calculated: 88 mg/dL (ref 0–99)
Triglycerides: 80 mg/dL (ref 0–149)
VLDL Cholesterol Cal: 16 mg/dL (ref 5–40)

## 2019-01-26 DIAGNOSIS — H2511 Age-related nuclear cataract, right eye: Secondary | ICD-10-CM | POA: Diagnosis not present

## 2019-01-26 DIAGNOSIS — H25011 Cortical age-related cataract, right eye: Secondary | ICD-10-CM | POA: Diagnosis not present

## 2019-01-26 DIAGNOSIS — H268 Other specified cataract: Secondary | ICD-10-CM | POA: Diagnosis not present

## 2019-01-27 DIAGNOSIS — Z01812 Encounter for preprocedural laboratory examination: Secondary | ICD-10-CM | POA: Diagnosis not present

## 2019-01-27 DIAGNOSIS — H268 Other specified cataract: Secondary | ICD-10-CM | POA: Diagnosis not present

## 2019-01-27 DIAGNOSIS — Z1159 Encounter for screening for other viral diseases: Secondary | ICD-10-CM | POA: Diagnosis not present

## 2019-02-02 DIAGNOSIS — H268 Other specified cataract: Secondary | ICD-10-CM | POA: Diagnosis not present

## 2019-02-02 DIAGNOSIS — H5703 Miosis: Secondary | ICD-10-CM | POA: Diagnosis not present

## 2019-02-02 DIAGNOSIS — H2589 Other age-related cataract: Secondary | ICD-10-CM | POA: Diagnosis not present

## 2019-03-16 DIAGNOSIS — F419 Anxiety disorder, unspecified: Secondary | ICD-10-CM | POA: Diagnosis not present

## 2019-03-16 DIAGNOSIS — M79605 Pain in left leg: Secondary | ICD-10-CM | POA: Diagnosis not present

## 2019-03-16 DIAGNOSIS — I25119 Atherosclerotic heart disease of native coronary artery with unspecified angina pectoris: Secondary | ICD-10-CM | POA: Diagnosis not present

## 2019-03-16 DIAGNOSIS — J449 Chronic obstructive pulmonary disease, unspecified: Secondary | ICD-10-CM | POA: Diagnosis not present

## 2019-03-16 DIAGNOSIS — H9193 Unspecified hearing loss, bilateral: Secondary | ICD-10-CM | POA: Diagnosis not present

## 2019-03-16 DIAGNOSIS — M79604 Pain in right leg: Secondary | ICD-10-CM | POA: Diagnosis not present

## 2019-03-25 DIAGNOSIS — H43812 Vitreous degeneration, left eye: Secondary | ICD-10-CM | POA: Diagnosis not present

## 2019-03-25 DIAGNOSIS — H40023 Open angle with borderline findings, high risk, bilateral: Secondary | ICD-10-CM | POA: Diagnosis not present

## 2019-03-25 DIAGNOSIS — H33312 Horseshoe tear of retina without detachment, left eye: Secondary | ICD-10-CM | POA: Diagnosis not present

## 2019-03-31 DIAGNOSIS — H903 Sensorineural hearing loss, bilateral: Secondary | ICD-10-CM | POA: Diagnosis not present

## 2019-03-31 DIAGNOSIS — H9193 Unspecified hearing loss, bilateral: Secondary | ICD-10-CM | POA: Diagnosis not present

## 2019-04-07 DIAGNOSIS — H33312 Horseshoe tear of retina without detachment, left eye: Secondary | ICD-10-CM | POA: Diagnosis not present

## 2019-04-07 DIAGNOSIS — H40023 Open angle with borderline findings, high risk, bilateral: Secondary | ICD-10-CM | POA: Diagnosis not present

## 2019-05-20 DIAGNOSIS — Z961 Presence of intraocular lens: Secondary | ICD-10-CM | POA: Diagnosis not present

## 2019-05-20 DIAGNOSIS — H5712 Ocular pain, left eye: Secondary | ICD-10-CM | POA: Diagnosis not present

## 2019-05-20 DIAGNOSIS — H2511 Age-related nuclear cataract, right eye: Secondary | ICD-10-CM | POA: Diagnosis not present

## 2019-05-20 DIAGNOSIS — H25011 Cortical age-related cataract, right eye: Secondary | ICD-10-CM | POA: Diagnosis not present

## 2019-05-20 DIAGNOSIS — H04123 Dry eye syndrome of bilateral lacrimal glands: Secondary | ICD-10-CM | POA: Diagnosis not present

## 2019-06-15 ENCOUNTER — Ambulatory Visit (INDEPENDENT_AMBULATORY_CARE_PROVIDER_SITE_OTHER): Payer: Medicare Other | Admitting: Cardiovascular Disease

## 2019-06-15 ENCOUNTER — Encounter: Payer: Self-pay | Admitting: Cardiovascular Disease

## 2019-06-15 ENCOUNTER — Other Ambulatory Visit: Payer: Self-pay

## 2019-06-15 DIAGNOSIS — I25119 Atherosclerotic heart disease of native coronary artery with unspecified angina pectoris: Secondary | ICD-10-CM | POA: Diagnosis not present

## 2019-06-15 DIAGNOSIS — Z79899 Other long term (current) drug therapy: Secondary | ICD-10-CM

## 2019-06-15 DIAGNOSIS — Z720 Tobacco use: Secondary | ICD-10-CM | POA: Diagnosis not present

## 2019-06-15 DIAGNOSIS — I214 Non-ST elevation (NSTEMI) myocardial infarction: Secondary | ICD-10-CM | POA: Diagnosis not present

## 2019-06-15 DIAGNOSIS — E785 Hyperlipidemia, unspecified: Secondary | ICD-10-CM | POA: Diagnosis not present

## 2019-06-15 NOTE — Progress Notes (Signed)
Cardiology Office Note    Date:  06/21/2019   ID:  HILARY MILKS, DOB Sep 06, 1950, MRN [id number]  PCP:  Patient, No Pcp Per  Cardiologist:  Shelva Majestic, MD     History of Present Illness:  Vanessa Campos is a 68 y.o. female who presents for a 63-monthfollow-up cardiology evaluation.  [facility]  Prior to her admission, a niece had died the week previously.  [facility] diarrhea chest pain.  Troponin was elevated at 7.06.  A COVID 19 evaluation was negative but this required several days for the results to be available.  She ultimately was transferred out of 2 W to cardiac telemetry.  Echo Doppler study showed an EF of 55 to 60% with grade 1 diastolic dysfunction, but also showed apical  Akinesis. Due to her persistently elevated troponins with a plateau curve and apical wall motion abnormality, definitive cardiac catheterization was recommended.  This was done Dr. HEllyn Hackand revealed distal pruning of her LAD circumflex and RPDA highly suggestive of SCAD.  Blood pressure was low and her LVEDP was low at 5 mm Hg.  Her heparin was discontinued.  She was started on aspirin and Plavix, and was hydrated. She had significant first-degree heart block and had episodes of Wenkebach block which ultimately resolved.  She was discharged on April 11 blocker therapy but on aspirin/Plavix/atorvastatin.  She was evaluated by me initially in a telemedicine visit on November 19, 2018.  Since hospital discharge, she was feeling much better but at times still noted some mild shortness of breath and a vague sensation of chest tightness.  Of note, during her hospitalization cholesterol was elevated at 215 with an LDL of 142 and she was started on atorvastatin 40 mg.  During that evaluation I elected to add low-dose isosorbide 15 mg with titration up to 30 mg as long as her blood  pressure remained greater than 100.  I discussed the importance of complete smoking cessation.    Subsequent blood work in June 2020 revealed a total cholesterol 176, HDL 72, LDL 88, triglycerides 80.  Presently she feels well.  She denies recurrent chest pain symptoms.  Breathing has improved.  She presents for reevaluation.   Past Medical History:  Diagnosis Date   Asthma    [place]    Coronary artery disease    [place]   provoked, treated with 6 months warfarin   GERD (gastroesophageal reflux disease)    Hyperlipidemia    Hypertension    Tobacco abuse     Past Surgical History:  Procedure Laterality Date   ABDOMINAL SURGERY     CARPAL TUNNEL RELEASE     LEFT HEART CATH AND CORONARY ANGIOGRAPHY N/A 11/05/2018   Procedure: LEFT HEART CATH AND CORONARY ANGIOGRAPHY;  Surgeon: HLeonie Man MD;  [facility];  Service: Cardiovascular;  Laterality: N/A;    Current Medications: Outpatient Medications Prior to Visit  Medication Sig Dispense Refill   acetaminophen (TYLENOL) 325 MG tablet Take 650 mg by mouth every 6 (six) hours as needed (for pain.).     aspirin EC 81 MG EC tablet Take 1 tablet (81 mg total) by mouth daily.     atorvastatin (LIPITOR) 40 MG tablet Take 1 tablet (40 mg total) by mouth daily at 6 PM.  90 tablet 3   clopidogrel (PLAVIX) 75 MG tablet Take 1 tablet (75 mg total) by mouth daily with breakfast. 90 tablet 3   diclofenac sodium (VOLTAREN) 1 % GEL Apply 2 g topically 4 (four) times daily as needed. (Patient taking differently: Apply 2 g topically 4 (four) times daily as needed. Pain.) 1 Tube 0   isosorbide mononitrate (IMDUR) 30 MG 24 hr tablet Take 0.5 tablet (15 mg) by mouth for 4 days, if BP okay increase to 1 tablet (30 mg) 90 tablet 3   nitroGLYCERIN (NITROSTAT) 0.4 MG SL tablet Place 1 tablet (0.4 mg total) under the tongue every 5 (five) minutes x 3  doses as needed for chest pain. 12 tablet 12   No facility-administered medications prior to visit.      Allergies:   Hydrocodone-acetaminophen, Tramadol, and Vicodin [hydrocodone-acetaminophen]   Social History   Socioeconomic History   Marital status: Single    Spouse name: Not on file   Number of children: Not on file   Years of education: Not on file   Highest education level: Not on file  Occupational History   Not on file  Social Needs   Financial resource strain: Not hard at all   Food insecurity    Worry: Never true    Inability: Never true   Transportation needs    Medical: No    Non-medical: No  Tobacco Use   Smoking status: Current Every Day Smoker    Packs/day: 1.00    Years: 40.00    Pack years: 40.00    Types: Cigarettes   Smokeless tobacco: Never Used  Substance and Sexual Activity   Alcohol use: No    Alcohol/week: 0.0 standard drinks   Drug use: No   Sexual activity: Not on file  Lifestyle   Physical activity    Days per week: Not on file    Minutes per session: Not on file   Stress: Not on file  Relationships   Social connections    Talks on phone: Not on file    Gets together: Not on file    Attends religious service: Not on file    Active member of club or organization: Not on file    Attends meetings of clubs or organizations: Not on file    Relationship status: Not on file  Other Topics Concern   Not on file  Social History Narrative   Not on file    Positive for tobacco abuse  Family History: Mother with diabetes and hypertension; diabetes and hypertension in her brother; hypertension in her sister  ROS General: Negative; No fevers, chills, or night sweats;  HEENT: Negative; No changes in vision or hearing, sinus congestion, difficulty swallowing Pulmonary: Negative; No cough, wheezing, shortness of breath, hemoptysis Cardiovascular: Negative; No chest pain, presyncope, syncope, palpitations GI: Negative; No  nausea, vomiting, diarrhea, or abdominal pain GU: Negative; No dysuria, hematuria, or difficulty voiding Musculoskeletal: Negative; no myalgias, joint pain, or weakness Hematologic/Oncology: Negative; no easy bruising, bleeding Endocrine: Negative; no heat/cold intolerance; no diabetes Neuro: Negative; no changes in balance, headaches Skin: Negative; No rashes or skin lesions Psychiatric: Negative; No behavioral problems, depression Sleep: Negative; No snoring, daytime sleepiness, hypersomnolence, bruxism, restless legs, hypnogognic hallucinations, no cataplexy Other comprehensive 14 point system review is negative.   PHYSICAL EXAM:   VS:  BP 111/67    Pulse 62    Temp (!) 96.8 F (36 C)    Ht _0  (1.6 m)  Wt 162 lb (73.5 kg)    SpO2 98%    BMI 28.70 kg/m    Wt Readings from Last 3 Encounters:  06/15/19 162 lb (73.5 kg)  11/19/18 144 lb 9.6 oz (65.6 kg)  11/08/18 139 lb (63 kg)    General: Alert, oriented, no distress.  Skin: normal turgor, no rashes, warm and dry HEENT: Normocephalic, atraumatic. Pupils equal round and reactive to light; sclera anicteric; extraocular muscles intact; Fundi ** Nose without nasal septal hypertrophy Mouth/Parynx benign; Mallinpatti scale Neck: No JVD, no carotid bruits; normal carotid upstroke Lungs: clear to ausculatation and percussion; no wheezing or rales Chest wall: without tenderness to palpitation Heart: PMI not displaced, RRR, s1 s2 normal, 1/6 systolic murmur, no diastolic murmur, no rubs, gallops, thrills, or heaves Abdomen: soft, nontender; no hepatosplenomehaly, BS+; abdominal aorta nontender and not dilated by palpation. Back: no CVA tenderness Pulses 2+ Musculoskeletal: full range of motion, normal strength, no joint deformities Extremities: no clubbing cyanosis or edema, Homan's sign negative  Neurologic: grossly nonfocal; Cranial nerves grossly wnl Psychologic: Normal mood and affect   Studies/Labs Reviewed:   EKG:  EKG is  ordered today.  ECG (independently read by me): NSR at 62; normal ectopy. No STT changes    Recent Labs: BMP Latest Ref Rng & Units 01/22/2019 11/05/2018 11/03/2018  Glucose 65 - 99 mg/dL 81 110(H) 116(H)  BUN 8 - 27 mg/dL _0 Creatinine 0.57 - 1.00 mg/dL 0.78 0.80 0.76  BUN/Creat Ratio 12 - 28 13 - -  Sodium 134 - 144 mmol/L 144 133(L) 131(L)  Potassium 3.5 - 5.2 mmol/L 4.3 3.6 3.5  Chloride 96 - 106 mmol/L 106 99 95(L)  CO2 20 - 29 mmol/L 25 21(L) 25  Calcium 8.7 - 10.3 mg/dL 8.9 8.9 8.7(L)     Hepatic Function Latest Ref Rng & Units 01/22/2019 10/31/2018 02/09/2008  Total Protein 6.0 - 8.5 g/dL 6.3 7.4 7.3  Albumin 3.8 - 4.8 g/dL 4.1 3.9 3.7  AST 0 - 40 IU/L _1 ALT 0 - 32 IU/L _2 Alk Phosphatase 39 - 117 IU/L 72 75 71  Total Bilirubin 0.0 - 1.2 mg/dL 0.4 0.6 0.7  Bilirubin, Direct - - - <0.1    CBC Latest Ref Rng & Units 11/08/2018 11/07/2018 11/06/2018  WBC 4.0 - 10.5 K/uL 7.0 7.1 8.4  Hemoglobin 12.0 - 15.0 g/dL 12.3 12.7 13.1  Hematocrit 36.0 - 46.0 % 37.0 37.5 39.8  Platelets 150 - 400 K/uL 334 350 332   Lab Results  Component Value Date   MCV 82.8 11/08/2018   MCV 82.8 11/07/2018   MCV 82.9 11/06/2018   Lab Results  Component Value Date   TSH 1.803 11/03/2018   Lab Results  Component Value Date   HGBA1C 5.7 (H) 11/03/2018     BNP No results found for: BNP  ProBNP No results found for: PROBNP   Lipid Panel     Component Value Date/Time   CHOL 148 06/17/2019 1147   TRIG 64 06/17/2019 1147   HDL 52 06/17/2019 1147   CHOLHDL 2.8 06/17/2019 1147   CHOLHDL 3.8 11/04/2018 0434   VLDL 17 11/04/2018 0434   LDLCALC 83 06/17/2019 1147   LABVLDL 13 06/17/2019 1147     RADIOLOGY: No results found.   Additional studies/ records that were reviewed today include:   Echo 11/04/18 IMPRESSIONS 1. The left ventricle has normal systolic function, with an ejection fraction of 55-60%. The cavity size was  normal. There is mildly increased left  ventricular wall thickness. Left ventricular diastolic Doppler parameters are consistent with impaired  relaxation. There is akinesis of the LV apex. Remainder of the wall segments contract normally. 2. The right ventricle has normal systolic function. The cavity was normal. There is no increase in right ventricular wall thickness. 3. Mild calcification of the mitral valve leaflet. No evidence of mitral valve stenosis. Trivial mitral regurgitation. 4. The aortic valve is tricuspid. Mild calcification of the aortic valve. No stenosis of the aortic valve. 5. The aortic root is normal in size and structure. 6. The IVC was normal in size. PA systolic pressure 31 mmHg.  FINDINGS Left Ventricle: The left ventricle has normal systolic function, with an ejection fraction of 55-60%. The cavity size was normal. There is mildly increased left ventricular wall thickness. Left ventricular diastolic Doppler parameters are consistent  with impaired relaxation. Right Ventricle: The right ventricle has normal systolic function. The cavity was normal. There is no increase in right ventricular wall thickness. Left Atrium: Left atrial size was normal in size. Right Atrium: Right atrial size was normal in size. Right atrial pressure is estimated at 10 mmHg. Interatrial Septum: No atrial level shunt detected by color flow Doppler. Pericardium: There is no evidence of pericardial effusion. Mitral Valve: The mitral valve is normal in structure. Mild calcification of the mitral valve leaflet. Mitral valve regurgitation is trivial by color flow Doppler. No evidence of mitral valve stenosis. Tricuspid Valve: The tricuspid valve is normal in structure. Tricuspid valve regurgitation is trivial by color flow Doppler. Aortic Valve: The aortic valve is tricuspid Mild calcification of the aortic valve. Aortic valve regurgitation was not visualized by color flow Doppler. There is No stenosis of the aortic valve. Pulmonic  Valve: The pulmonic valve was normal in structure. Pulmonic valve regurgitation is not visualized by color flow Doppler. Aorta: The aortic root is normal in size and structure. Venous: The inferior vena cava is normal in size with greater than 50% respiratory variability.   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  Low LVEDP with systemic hypo-tension.  RECOMMENDATIONS  She will return to her nursing unit for ongoing care  Stop IV heparin as this appears to be more consistent with SCAD -we will simply start Plavix with loading today.  Pending blood pressure stabilization, may want to consider long-acting nitrate  Otherwise risk factor modifications per primary cardiology team    ASSESSMENT:    1. NSTEMI (non-ST elevated myocardial infarction) ([place]); 11/05/2018   2. Coronary artery disease involving native coronary artery of native heart with angina pectoris ([place])   3. Hyperlipidemia LDL goal <70   4. Medication management   5. Tobacco abuse     PLAN:  Vanessa Campos is a 68 year old African-American female who has a history of tobacco use, who suffered a non-ST segment elevation myocardial infarction leading to catheterization on November 05, 2018.  She was found to have three-vessel distal coronary artery disease involving her distal LAD, circumflex and distal RCA with subtotal to total occlusion with vessel pruning suggesting of possible SCAD.  Presently she has been without recurrent anginal symptomatology on her current regimen of isosorbide 30 mg, in addition to her dual antiplatelet therapy with aspirin and Plavix.  Her  blood pressure today is stable at 110/64 when rechecked by me.  Initial laboratory did reveal significant hyperlipidemia  with an LDL cholesterol of 142 which improved to 88 on her most recent laboratory test in June.  I have recommended follow-up laboratory be obtained and if her LDL cholesterol is greater than 70 atorvastatin dose will either be increased or Zetia added to her current regiment.  Tobacco cessation is essential.  She will monitor her blood pressure.   Medication Adjustments/Labs and Tests Ordered: Current medicines are reviewed at length with the patient today.  Concerns regarding medicines are outlined above.  Medication changes, Labs and Tests ordered today are listed in the Patient Instructions below. Patient Instructions  Labwork: FASTING LIPID PANEL HERE IN OUR OFFICE AT [facility]    You will need to fast. DO NOT EAT OR DRINK PAST MIDNIGHT.       If you have labs (blood work) drawn today and your tests are completely normal, you will receive your results only by:  [place] (if you have MyChart) OR  A paper copy in the mail If you have any lab test that is abnormal or we need to change your treatment, we will call you to review the results.  Follow-Up: IN 6 months Please call our office 2 months in advance, FEB 2021 to schedule this MAY 2021 appointment. In Person Shelva Majestic, MD.    At [facility], you and your health needs are our priority.  As part of our continuing mission to provide you with exceptional heart care, we have created designated Provider Care Teams.  These Care Teams include your primary Cardiologist (physician) and Advanced Practice Providers (APPs -  Physician Assistants and Nurse Practitioners) who all work together to provide you with the care you need, when you need it.  Thank you for choosing [facility] at [facility]!!          Signed, Shelva Majestic, MD  06/21/2019 10:02 AM    [facility] [address] [place], [place], [place]  [address] Phone: [phone]

## 2019-06-15 NOTE — Patient Instructions (Signed)
Labwork: FASTING LIPID PANEL HERE IN OUR OFFICE AT [facility]    You will need to fast. DO NOT EAT OR DRINK PAST MIDNIGHT.       If you have labs (blood work) drawn today and your tests are completely normal, you will receive your results only by: [facility] MyChart Message (if you have MyChart) OR . A paper copy in the mail If you have any lab test that is abnormal or we need to change your treatment, we will call you to review the results.  Follow-Up: IN 6 months Please call our office 2 months in advance, FEB 2021 to schedule this MAY 2021 appointment. In Person Shelva Majestic, MD.    At [facility], you and your health needs are our priority.  As part of our continuing mission to provide you with exceptional heart care, we have created designated Provider Care Teams.  These Care Teams include your primary Cardiologist (physician) and Advanced Practice Providers (APPs -  Physician Assistants and Nurse Practitioners) who all work together to provide you with the care you need, when you need it.  Thank you for choosing [facility] at [facility]!!

## 2019-06-17 DIAGNOSIS — Z79899 Other long term (current) drug therapy: Secondary | ICD-10-CM | POA: Diagnosis not present

## 2019-06-17 DIAGNOSIS — I25119 Atherosclerotic heart disease of native coronary artery with unspecified angina pectoris: Secondary | ICD-10-CM | POA: Diagnosis not present

## 2019-06-17 DIAGNOSIS — E785 Hyperlipidemia, unspecified: Secondary | ICD-10-CM | POA: Diagnosis not present

## 2019-06-17 LAB — LIPID PANEL
Chol/HDL Ratio: 2.8 ratio (ref 0.0–4.4)
Cholesterol, Total: 148 mg/dL (ref 100–199)
HDL: 52 mg/dL (ref >39)
LDL Chol Calc (NIH): 83 mg/dL (ref 0–99)
Triglycerides: 64 mg/dL (ref 0–149)
VLDL Cholesterol Cal: 13 mg/dL (ref 5–40)

## 2019-06-21 ENCOUNTER — Encounter: Payer: Self-pay | Admitting: Cardiovascular Disease

## 2019-06-22 DIAGNOSIS — Z0000 Encounter for general adult medical examination without abnormal findings: Secondary | ICD-10-CM | POA: Diagnosis not present

## 2019-06-22 DIAGNOSIS — F17200 Nicotine dependence, unspecified, uncomplicated: Secondary | ICD-10-CM | POA: Diagnosis not present

## 2019-06-22 DIAGNOSIS — I214 Non-ST elevation (NSTEMI) myocardial infarction: Secondary | ICD-10-CM | POA: Diagnosis not present

## 2019-06-22 DIAGNOSIS — E785 Hyperlipidemia, unspecified: Secondary | ICD-10-CM | POA: Diagnosis not present

## 2019-06-22 DIAGNOSIS — Z23 Encounter for immunization: Secondary | ICD-10-CM | POA: Diagnosis not present

## 2019-06-22 DIAGNOSIS — Z716 Tobacco abuse counseling: Secondary | ICD-10-CM | POA: Diagnosis not present

## 2019-06-29 ENCOUNTER — Other Ambulatory Visit: Payer: Self-pay

## 2019-06-29 DIAGNOSIS — Z20822 Contact with and (suspected) exposure to covid-19: Secondary | ICD-10-CM

## 2019-06-30 LAB — NOVEL CORONAVIRUS, NAA: SARS-CoV-2, NAA: NOT DETECTED

## 2019-07-03 ENCOUNTER — Telehealth: Payer: Self-pay | Admitting: *Deleted

## 2019-07-03 NOTE — Telephone Encounter (Signed)
   [facility] Pre-operative Risk Assessment    Request for surgical clearance:  1. What type of surgery is being performed? colonoscopy   2. When is this surgery scheduled? TBD  3. What type of clearance is required (medical clearance vs. Pharmacy clearance to hold med vs. Both)? both  4. Are there any medications that need to be held prior to surgery and how long?plavix hold for 5-7 days   5. Practice name and name of physician performing surgery? [facility]   6. What is your office phone number [phone]    7.   What is your office fax number [phone]  8.   Anesthesia type (None, local, MAC, general) ? Not listed   Fredia Beets 07/03/2019, 3:24 PM  _________________________________________________________________   (provider comments below)

## 2019-07-03 NOTE — Telephone Encounter (Signed)
Dr. Claiborne Billings  Can you please address holding this patient's Plavix therapy for 5 to 7 days prior to colonoscopy?  He last saw her 06/15/2019 in which she was doing well from a cardiac standpoint.  He has a prior history of non-STEMI leading to a cardiac cath 11/05/2018 found to have three-vessel distal coronary artery disease involving her distal LAD, circumflex and distal RCA with subtotal to total occlusion and possible scad with no conclusive PCI targets.  Please send your holding recommendations to the preoperative pool  Thank you Sharee Pimple

## 2019-07-06 NOTE — Telephone Encounter (Signed)
Almyra Free, or whoever is covering, can somebody ask Dr Claiborne Billings about holding Plavix for this patient pre op...thanks.

## 2019-07-08 NOTE — Telephone Encounter (Signed)
Okay to hold Plavix for 5 days prior to colonoscopy

## 2019-07-08 NOTE — Telephone Encounter (Signed)
   Primary Cardiologist: Shelva Majestic, MD  Chart reviewed as part of pre-operative protocol coverage. Given past medical history and time since last visit, based on ACC/AHA guidelines, ROZALIND GALLOWAY would be at acceptable risk for the planned procedure without further cardiovascular testing.   OK to hold Plavix 5 day pre op if needed.  I will route this recommendation to the requesting party via Epic fax function and remove from pre-op pool.  Please call with questions.  Kerin Ransom, PA-C 07/08/2019, 1:02 PM

## 2019-08-04 DIAGNOSIS — D124 Benign neoplasm of descending colon: Secondary | ICD-10-CM | POA: Diagnosis not present

## 2019-08-04 DIAGNOSIS — D125 Benign neoplasm of sigmoid colon: Secondary | ICD-10-CM | POA: Diagnosis not present

## 2019-08-04 DIAGNOSIS — K635 Polyp of colon: Secondary | ICD-10-CM | POA: Diagnosis not present

## 2019-08-04 DIAGNOSIS — K621 Rectal polyp: Secondary | ICD-10-CM | POA: Diagnosis not present

## 2019-08-04 DIAGNOSIS — Z1211 Encounter for screening for malignant neoplasm of colon: Secondary | ICD-10-CM | POA: Diagnosis not present

## 2019-08-04 DIAGNOSIS — K648 Other hemorrhoids: Secondary | ICD-10-CM | POA: Diagnosis not present

## 2019-08-31 DIAGNOSIS — I214 Non-ST elevation (NSTEMI) myocardial infarction: Secondary | ICD-10-CM | POA: Diagnosis not present

## 2019-08-31 DIAGNOSIS — H903 Sensorineural hearing loss, bilateral: Secondary | ICD-10-CM | POA: Diagnosis not present

## 2019-08-31 DIAGNOSIS — I25119 Atherosclerotic heart disease of native coronary artery with unspecified angina pectoris: Secondary | ICD-10-CM | POA: Diagnosis not present

## 2019-08-31 DIAGNOSIS — Z716 Tobacco abuse counseling: Secondary | ICD-10-CM | POA: Diagnosis not present

## 2019-08-31 DIAGNOSIS — R69 Illness, unspecified: Secondary | ICD-10-CM | POA: Diagnosis not present

## 2019-08-31 DIAGNOSIS — J449 Chronic obstructive pulmonary disease, unspecified: Secondary | ICD-10-CM | POA: Diagnosis not present

## 2019-08-31 DIAGNOSIS — Z87891 Personal history of nicotine dependence: Secondary | ICD-10-CM | POA: Diagnosis not present

## 2019-09-04 DIAGNOSIS — I25119 Atherosclerotic heart disease of native coronary artery with unspecified angina pectoris: Secondary | ICD-10-CM | POA: Diagnosis not present

## 2019-09-04 DIAGNOSIS — J449 Chronic obstructive pulmonary disease, unspecified: Secondary | ICD-10-CM | POA: Diagnosis not present

## 2019-09-04 DIAGNOSIS — E782 Mixed hyperlipidemia: Secondary | ICD-10-CM | POA: Diagnosis not present

## 2019-09-04 DIAGNOSIS — Z87891 Personal history of nicotine dependence: Secondary | ICD-10-CM | POA: Diagnosis not present

## 2019-09-04 DIAGNOSIS — E785 Hyperlipidemia, unspecified: Secondary | ICD-10-CM | POA: Diagnosis not present

## 2019-09-04 DIAGNOSIS — I214 Non-ST elevation (NSTEMI) myocardial infarction: Secondary | ICD-10-CM | POA: Diagnosis not present

## 2019-09-12 DIAGNOSIS — M1990 Unspecified osteoarthritis, unspecified site: Secondary | ICD-10-CM | POA: Diagnosis not present

## 2019-09-12 DIAGNOSIS — K08109 Complete loss of teeth, unspecified cause, unspecified class: Secondary | ICD-10-CM | POA: Diagnosis not present

## 2019-09-12 DIAGNOSIS — I252 Old myocardial infarction: Secondary | ICD-10-CM | POA: Diagnosis not present

## 2019-09-12 DIAGNOSIS — R69 Illness, unspecified: Secondary | ICD-10-CM | POA: Diagnosis not present

## 2019-09-12 DIAGNOSIS — I2510 Atherosclerotic heart disease of native coronary artery without angina pectoris: Secondary | ICD-10-CM | POA: Diagnosis not present

## 2019-09-12 DIAGNOSIS — Z7902 Long term (current) use of antithrombotics/antiplatelets: Secondary | ICD-10-CM | POA: Diagnosis not present

## 2019-09-12 DIAGNOSIS — G8929 Other chronic pain: Secondary | ICD-10-CM | POA: Diagnosis not present

## 2019-09-12 DIAGNOSIS — H269 Unspecified cataract: Secondary | ICD-10-CM | POA: Diagnosis not present

## 2019-09-12 DIAGNOSIS — I10 Essential (primary) hypertension: Secondary | ICD-10-CM | POA: Diagnosis not present

## 2019-09-12 DIAGNOSIS — E785 Hyperlipidemia, unspecified: Secondary | ICD-10-CM | POA: Diagnosis not present

## 2019-09-15 DIAGNOSIS — H903 Sensorineural hearing loss, bilateral: Secondary | ICD-10-CM | POA: Diagnosis not present

## 2019-09-15 DIAGNOSIS — H9113 Presbycusis, bilateral: Secondary | ICD-10-CM | POA: Diagnosis not present

## 2019-10-05 DIAGNOSIS — H903 Sensorineural hearing loss, bilateral: Secondary | ICD-10-CM | POA: Diagnosis not present

## 2019-10-14 DIAGNOSIS — H903 Sensorineural hearing loss, bilateral: Secondary | ICD-10-CM | POA: Diagnosis not present

## 2019-12-11 DIAGNOSIS — I2510 Atherosclerotic heart disease of native coronary artery without angina pectoris: Secondary | ICD-10-CM | POA: Diagnosis not present

## 2019-12-11 DIAGNOSIS — E785 Hyperlipidemia, unspecified: Secondary | ICD-10-CM | POA: Diagnosis not present

## 2019-12-11 DIAGNOSIS — Z87891 Personal history of nicotine dependence: Secondary | ICD-10-CM | POA: Diagnosis not present

## 2019-12-11 DIAGNOSIS — J449 Chronic obstructive pulmonary disease, unspecified: Secondary | ICD-10-CM | POA: Diagnosis not present

## 2019-12-11 DIAGNOSIS — Z86718 Personal history of other venous thrombosis and embolism: Secondary | ICD-10-CM | POA: Diagnosis not present

## 2019-12-11 DIAGNOSIS — I252 Old myocardial infarction: Secondary | ICD-10-CM | POA: Diagnosis not present

## 2019-12-14 DIAGNOSIS — R69 Illness, unspecified: Secondary | ICD-10-CM | POA: Diagnosis not present

## 2019-12-14 DIAGNOSIS — Z0000 Encounter for general adult medical examination without abnormal findings: Secondary | ICD-10-CM | POA: Diagnosis not present

## 2019-12-14 DIAGNOSIS — J449 Chronic obstructive pulmonary disease, unspecified: Secondary | ICD-10-CM | POA: Diagnosis not present

## 2019-12-14 DIAGNOSIS — I25119 Atherosclerotic heart disease of native coronary artery with unspecified angina pectoris: Secondary | ICD-10-CM | POA: Diagnosis not present

## 2019-12-14 DIAGNOSIS — M25561 Pain in right knee: Secondary | ICD-10-CM | POA: Diagnosis not present

## 2020-01-12 DIAGNOSIS — Z1231 Encounter for screening mammogram for malignant neoplasm of breast: Secondary | ICD-10-CM | POA: Diagnosis not present

## 2020-01-12 DIAGNOSIS — R69 Illness, unspecified: Secondary | ICD-10-CM | POA: Diagnosis not present

## 2020-01-12 DIAGNOSIS — F17210 Nicotine dependence, cigarettes, uncomplicated: Secondary | ICD-10-CM | POA: Diagnosis not present

## 2020-01-12 DIAGNOSIS — J449 Chronic obstructive pulmonary disease, unspecified: Secondary | ICD-10-CM | POA: Diagnosis not present

## 2020-01-19 DIAGNOSIS — J439 Emphysema, unspecified: Secondary | ICD-10-CM | POA: Diagnosis not present

## 2020-01-19 DIAGNOSIS — R69 Illness, unspecified: Secondary | ICD-10-CM | POA: Diagnosis not present

## 2020-01-19 DIAGNOSIS — I700 Atherosclerosis of aorta: Secondary | ICD-10-CM | POA: Diagnosis not present

## 2020-01-19 DIAGNOSIS — D3502 Benign neoplasm of left adrenal gland: Secondary | ICD-10-CM | POA: Diagnosis not present

## 2020-01-26 DIAGNOSIS — R928 Other abnormal and inconclusive findings on diagnostic imaging of breast: Secondary | ICD-10-CM | POA: Diagnosis not present

## 2020-01-26 DIAGNOSIS — J449 Chronic obstructive pulmonary disease, unspecified: Secondary | ICD-10-CM | POA: Diagnosis not present

## 2020-01-26 DIAGNOSIS — N6325 Unspecified lump in the left breast, overlapping quadrants: Secondary | ICD-10-CM | POA: Diagnosis not present

## 2020-02-04 DIAGNOSIS — J432 Centrilobular emphysema: Secondary | ICD-10-CM | POA: Diagnosis not present

## 2020-02-04 DIAGNOSIS — F17210 Nicotine dependence, cigarettes, uncomplicated: Secondary | ICD-10-CM | POA: Diagnosis not present

## 2020-02-04 DIAGNOSIS — R69 Illness, unspecified: Secondary | ICD-10-CM | POA: Diagnosis not present

## 2020-02-09 DIAGNOSIS — M1712 Unilateral primary osteoarthritis, left knee: Secondary | ICD-10-CM | POA: Diagnosis not present

## 2020-02-12 DIAGNOSIS — M25562 Pain in left knee: Secondary | ICD-10-CM | POA: Diagnosis not present

## 2020-02-22 DIAGNOSIS — M25562 Pain in left knee: Secondary | ICD-10-CM | POA: Diagnosis not present

## 2020-02-22 DIAGNOSIS — M1612 Unilateral primary osteoarthritis, left hip: Secondary | ICD-10-CM | POA: Diagnosis not present

## 2020-03-13 ENCOUNTER — Other Ambulatory Visit: Payer: Self-pay

## 2020-03-13 ENCOUNTER — Encounter (HOSPITAL_BASED_OUTPATIENT_CLINIC_OR_DEPARTMENT_OTHER): Payer: Self-pay | Admitting: Emergency Medicine

## 2020-03-13 ENCOUNTER — Emergency Department (HOSPITAL_BASED_OUTPATIENT_CLINIC_OR_DEPARTMENT_OTHER): Payer: Medicare HMO

## 2020-03-13 ENCOUNTER — Emergency Department (HOSPITAL_BASED_OUTPATIENT_CLINIC_OR_DEPARTMENT_OTHER)
Admission: EM | Admit: 2020-03-13 | Discharge: 2020-03-13 | Disposition: A | Discharge status: Home / Self Care | Payer: Medicare HMO | Attending: Emergency Medicine | Admitting: Emergency Medicine

## 2020-03-13 DIAGNOSIS — D259 Leiomyoma of uterus, unspecified: Secondary | ICD-10-CM | POA: Diagnosis not present

## 2020-03-13 DIAGNOSIS — J45909 Unspecified asthma, uncomplicated: Secondary | ICD-10-CM | POA: Diagnosis not present

## 2020-03-13 DIAGNOSIS — Z79899 Other long term (current) drug therapy: Secondary | ICD-10-CM | POA: Diagnosis not present

## 2020-03-13 DIAGNOSIS — R69 Illness, unspecified: Secondary | ICD-10-CM | POA: Diagnosis not present

## 2020-03-13 DIAGNOSIS — Z955 Presence of coronary angioplasty implant and graft: Secondary | ICD-10-CM | POA: Insufficient documentation

## 2020-03-13 DIAGNOSIS — R112 Nausea with vomiting, unspecified: Secondary | ICD-10-CM | POA: Insufficient documentation

## 2020-03-13 DIAGNOSIS — K439 Ventral hernia without obstruction or gangrene: Secondary | ICD-10-CM | POA: Diagnosis not present

## 2020-03-13 DIAGNOSIS — R1033 Periumbilical pain: Secondary | ICD-10-CM

## 2020-03-13 DIAGNOSIS — J449 Chronic obstructive pulmonary disease, unspecified: Secondary | ICD-10-CM | POA: Diagnosis not present

## 2020-03-13 DIAGNOSIS — Z86718 Personal history of other venous thrombosis and embolism: Secondary | ICD-10-CM | POA: Insufficient documentation

## 2020-03-13 DIAGNOSIS — Z7982 Long term (current) use of aspirin: Secondary | ICD-10-CM | POA: Insufficient documentation

## 2020-03-13 DIAGNOSIS — F17210 Nicotine dependence, cigarettes, uncomplicated: Secondary | ICD-10-CM | POA: Diagnosis not present

## 2020-03-13 DIAGNOSIS — Z86711 Personal history of pulmonary embolism: Secondary | ICD-10-CM | POA: Diagnosis not present

## 2020-03-13 DIAGNOSIS — I700 Atherosclerosis of aorta: Secondary | ICD-10-CM | POA: Diagnosis not present

## 2020-03-13 DIAGNOSIS — I2510 Atherosclerotic heart disease of native coronary artery without angina pectoris: Secondary | ICD-10-CM | POA: Insufficient documentation

## 2020-03-13 DIAGNOSIS — I10 Essential (primary) hypertension: Secondary | ICD-10-CM | POA: Diagnosis not present

## 2020-03-13 DIAGNOSIS — R109 Unspecified abdominal pain: Secondary | ICD-10-CM | POA: Diagnosis not present

## 2020-03-13 DIAGNOSIS — K7689 Other specified diseases of liver: Secondary | ICD-10-CM | POA: Diagnosis not present

## 2020-03-13 LAB — CBC WITH DIFFERENTIAL/PLATELET
Abs Immature Granulocytes: 0.02 10*3/uL (ref 0.00–0.07)
Basophils Absolute: 0 10*3/uL (ref 0.0–0.1)
Basophils Relative: 0 %
Eosinophils Absolute: 0 10*3/uL (ref 0.0–0.5)
Eosinophils Relative: 0 %
HCT: 45.6 % (ref 36.0–46.0)
Hemoglobin: 15.6 g/dL — ABNORMAL HIGH (ref 12.0–15.0)
Immature Granulocytes: 0 %
Lymphocytes Relative: 31 %
Lymphs Abs: 3 10*3/uL (ref 0.7–4.0)
MCH: 28.5 pg (ref 26.0–34.0)
MCHC: 34.2 g/dL (ref 30.0–36.0)
MCV: 83.2 fL (ref 80.0–100.0)
Monocytes Absolute: 0.8 10*3/uL (ref 0.1–1.0)
Monocytes Relative: 8 %
Neutro Abs: 5.8 10*3/uL (ref 1.7–7.7)
Neutrophils Relative %: 61 %
Platelets: 353 10*3/uL (ref 150–400)
RBC: 5.48 MIL/uL — ABNORMAL HIGH (ref 3.87–5.11)
RDW: 13.7 % (ref 11.5–15.5)
WBC: 9.5 10*3/uL (ref 4.0–10.5)
nRBC: 0 % (ref 0.0–0.2)

## 2020-03-13 LAB — URINALYSIS, ROUTINE W REFLEX MICROSCOPIC
Bilirubin Urine: NEGATIVE
Glucose, UA: NEGATIVE mg/dL
Hgb urine dipstick: NEGATIVE
Ketones, ur: NEGATIVE mg/dL
Leukocytes,Ua: NEGATIVE
Nitrite: NEGATIVE
Protein, ur: NEGATIVE mg/dL
Specific Gravity, Urine: 1.01 (ref 1.005–1.030)
pH: 5.5 (ref 5.0–8.0)

## 2020-03-13 LAB — COMPREHENSIVE METABOLIC PANEL
ALT: 18 U/L (ref 0–44)
AST: 21 U/L (ref 15–41)
Albumin: 3.9 g/dL (ref 3.5–5.0)
Alkaline Phosphatase: 85 U/L (ref 38–126)
Anion gap: 13 (ref 5–15)
BUN: 14 mg/dL (ref 8–23)
CO2: 24 mmol/L (ref 22–32)
Calcium: 9.1 mg/dL (ref 8.9–10.3)
Chloride: 98 mmol/L (ref 98–111)
Creatinine, Ser: 0.92 mg/dL (ref 0.44–1.00)
GFR calc Af Amer: >60 mL/min (ref >60)
GFR calc non Af Amer: >60 mL/min (ref >60)
Glucose, Bld: 116 mg/dL — ABNORMAL HIGH (ref 70–99)
Potassium: 3.9 mmol/L (ref 3.5–5.1)
Sodium: 135 mmol/L (ref 135–145)
Total Bilirubin: 0.6 mg/dL (ref 0.3–1.2)
Total Protein: 7.5 g/dL (ref 6.5–8.1)

## 2020-03-13 LAB — RAPID URINE DRUG SCREEN, HOSP PERFORMED
Amphetamines: NOT DETECTED
Barbiturates: NOT DETECTED
Benzodiazepines: NOT DETECTED
Cocaine: NOT DETECTED
Opiates: POSITIVE — AB
Tetrahydrocannabinol: NOT DETECTED

## 2020-03-13 LAB — LIPASE, BLOOD: Lipase: 34 U/L (ref 11–51)

## 2020-03-13 MED ORDER — IOHEXOL 300 MG/ML  SOLN
100.0000 mL | Freq: Once | INTRAMUSCULAR | Status: AC | PRN
  Administered 2020-03-13: 100 mL via INTRAVENOUS

## 2020-03-13 MED ORDER — SODIUM CHLORIDE 0.9 % IV BOLUS
1000.0000 mL | Freq: Once | INTRAVENOUS | Status: AC
  Administered 2020-03-13: 1000 mL via INTRAVENOUS

## 2020-03-13 MED ORDER — ONDANSETRON HCL 4 MG/2ML IJ SOLN
4.0000 mg | Freq: Once | INTRAMUSCULAR | Status: AC
  Administered 2020-03-13: 4 mg via INTRAVENOUS
  Filled 2020-03-13: qty 2

## 2020-03-13 MED ORDER — FENTANYL CITRATE (PF) 100 MCG/2ML IJ SOLN
50.0000 ug | Freq: Once | INTRAMUSCULAR | Status: AC
  Administered 2020-03-13: 50 ug via INTRAVENOUS
  Filled 2020-03-13: qty 2

## 2020-03-13 MED ORDER — MORPHINE SULFATE (PF) 4 MG/ML IV SOLN
4.0000 mg | Freq: Once | INTRAVENOUS | Status: AC
  Administered 2020-03-13: 4 mg via INTRAVENOUS
  Filled 2020-03-13: qty 1

## 2020-03-13 NOTE — ED Triage Notes (Signed)
Pt here with umbilical abdominal pain since Thursday with emesis. No other sx present.

## 2020-03-13 NOTE — Discharge Instructions (Addendum)
As we discussed today, your work-up looked reassuring.  Your CT scan did show evidence of uterine fibroids.  This could be contributing to your pain.  Follow-up with the referred OB/GYN for further evaluation and management of her uterine fibroids.  Return the emergency department for any worsening pain, vomiting, chest pain, difficulty breathing or any other worsening or concerning symptoms.

## 2020-03-13 NOTE — ED Notes (Signed)
ED Provider at bedside. 

## 2020-03-13 NOTE — Medical Student Note (Signed)
[facility] Provider Student Note For educational purposes for Medical, PA and NP students only and not part of the legal medical record.   CSN: [id number] Arrival date & time: 03/13/20  0931      History   Chief Complaint Chief Complaint  Patient presents with  . Abdominal Pain  . Emesis    HPI Vanessa Campos is a 69 y.o. female with PMH of asthma, COPD, CAD, hx MI, GERD presenting to the ED today for abdominal pain and emesis. The pt reports periumbilical abdominal pain that began on Thursday night 03/10/2020  after she went to the movies around 8 PM. Thinks that her sx are due to the popcorn she ate at the movies on Thursday night. Describes the pain as "crmapy" and "tightening". Reports that pain comes and goes but says that her only relief of pain is when she sleeps at night, and she wakes up again with the pain. She says the pain has worsened since then and she reports that she has had associated vomiting that began on Friday, with about 10 episodes per day. She says that she has mucus in her vomit and it is nonbloody. She says that the pain does not radiate to other abdominal quadrants, and says that she has some lower back pain in the lumbosacral area. She reports that she has not had a BM since Thursday. Denies rectal bleed, vaginal sx, difficulty urinating or hematuria. Has not tried anything for the pain at home. Nothing makes the pain better and pt denies relief of pain with vomiting or onset of pain with vomiting.  Reports hx of hernia surgery on abdomen.   HPI  Past Medical History:  Diagnosis Date  . Asthma   . COPD (chronic obstructive pulmonary disease) ([place])   . Coronary artery disease   . DVT (deep venous thrombosis) ([place]) 2014   provoked, treated with 6 months warfarin  . GERD (gastroesophageal reflux disease)   . Hyperlipidemia   . Hypertension   . Tobacco abuse     Patient Active Problem List   Diagnosis Date Noted  . CAD (coronary artery  disease) 11/08/2018  . NSTEMI (non-ST elevated myocardial infarction) ([place]) 11/02/2018  . COPD, mild ([place]) 02/02/2013  . Pulmonary embolism on right ([place]) 10/14/2012  . Hypotension, unspecified 10/11/2012  . Thyroid nodule 10/08/2012  . Unspecified constipation 10/08/2012  . Tobacco use disorder 10/05/2012  . GERD (gastroesophageal reflux disease) 10/05/2012    Past Surgical History:  Procedure Laterality Date  . ABDOMINAL SURGERY    . CARPAL TUNNEL RELEASE    . LEFT HEART CATH AND CORONARY ANGIOGRAPHY N/A 11/05/2018   Procedure: LEFT HEART CATH AND CORONARY ANGIOGRAPHY;  Surgeon: Leonie Man, MD;  Location: [facility];  Service: Cardiovascular;  Laterality: N/A;    OB History   No obstetric history on file.      Home Medications    Prior to Admission medications   Medication Sig Start Date End Date Taking? Authorizing Provider  acetaminophen (TYLENOL) 325 MG tablet Take 650 mg by mouth every 6 (six) hours as needed (for pain.).    [provider]  aspirin EC 81 MG EC tablet Take 1 tablet (81 mg total) by mouth daily. 11/09/18   Eileen Stanford, PA-C  atorvastatin (LIPITOR) 40 MG tablet Take 1 tablet (40 mg total) by mouth daily at 6 PM. 11/08/18   Eileen Stanford, PA-C  clopidogrel (PLAVIX) 75 MG tablet Take 1 tablet (  75 mg total) by mouth daily with breakfast. 11/09/18   Eileen Stanford, PA-C  diclofenac sodium (VOLTAREN) 1 % GEL Apply 2 g topically 4 (four) times daily as needed. Patient taking differently: Apply 2 g topically 4 (four) times daily as needed. Pain. 10/19/18   Ward, Ozella Almond, PA-C  isosorbide mononitrate (IMDUR) 30 MG 24 hr tablet Take 0.5 tablet (15 mg) by mouth for 4 days, if BP okay increase to 1 tablet (30 mg) 11/19/18   Troy Sine, MD  nitroGLYCERIN (NITROSTAT) 0.4 MG SL tablet Place 1 tablet (0.4 mg total) under the tongue every 5 (five) minutes x 3 doses as needed for chest pain. 11/08/18   Eileen Stanford, PA-C     Family History History reviewed. No pertinent family history.  Social History Social History   Tobacco Use  . Smoking status: Current Every Day Smoker    Packs/day: 1.00    Years: 40.00    Pack years: 40.00    Types: Cigarettes  . Smokeless tobacco: Never Used  Vaping Use  . Vaping Use: Never used  Substance Use Topics  . Alcohol use: No    Alcohol/week: 0.0 standard drinks  . Drug use: No     Allergies   Hydrocodone-acetaminophen, Tramadol, and Vicodin [hydrocodone-acetaminophen]   Review of Systems Review of Systems  Constitutional: Positive for appetite change (decreased secondary to pain ). Negative for fever.  HENT: Negative for rhinorrhea and sore throat.   Eyes: Negative for visual disturbance.  Respiratory: Negative for cough, chest tightness and shortness of breath.   Cardiovascular: Negative for chest pain and palpitations.  Gastrointestinal: Positive for abdominal pain, nausea and vomiting. Negative for abdominal distention, anal bleeding, blood in stool and diarrhea. Constipation: no BM since Thursday.  Genitourinary: Negative for difficulty urinating and hematuria.  Musculoskeletal: Positive for back pain (lumbosacral pain ).     Physical Exam Updated Vital Signs BP 115/68 (BP Location: Left Arm)   Pulse (!) 56   Temp 97.6 F (36.4 C) (Oral)   Resp 16   SpO2 100%   Physical Exam Constitutional:      Appearance: She is well-developed. She is ill-appearing (restless in pain).  HENT:     Head: Normocephalic.     Mouth/Throat:     Pharynx: Oropharynx is clear.  Eyes:     General: No scleral icterus.    Extraocular Movements: Extraocular movements intact.  Cardiovascular:     Rate and Rhythm: Normal rate and regular rhythm.     Heart sounds: Normal heart sounds.  Pulmonary:     Effort: Pulmonary effort is normal.     Breath sounds: Normal breath sounds.  Abdominal:     General: There is no distension.     Palpations: Abdomen is soft.  There is mass.     Tenderness: There is abdominal tenderness in the periumbilical area. There is no right CVA tenderness or left CVA tenderness.     Hernia: No hernia is present.  Skin:    General: Skin is warm and dry.  Neurological:     Mental Status: She is alert and oriented to person, place, and time.      ED Treatments / Results  Labs (all labs ordered are listed, but only abnormal results are displayed) Labs Reviewed  COMPREHENSIVE METABOLIC PANEL - Abnormal; Notable for the following components:      Result Value   Glucose, Bld 116 (*)    All other components within normal limits  CBC WITH DIFFERENTIAL/PLATELET - Abnormal; Notable for the following components:   RBC 5.48 (*)    Hemoglobin 15.6 (*)    All other components within normal limits  RAPID URINE DRUG SCREEN, HOSP PERFORMED - Abnormal; Notable for the following components:   Opiates POSITIVE (*)    All other components within normal limits  LIPASE, BLOOD  URINALYSIS, ROUTINE W REFLEX MICROSCOPIC    EKG  Radiology CT ABDOMEN PELVIS W CONTRAST  Result Date: 03/13/2020 CLINICAL DATA:  Diffuse abdominal pain EXAM: CT ABDOMEN AND PELVIS WITH CONTRAST TECHNIQUE: Multidetector CT imaging of the abdomen and pelvis was performed using the standard protocol following bolus administration of intravenous contrast. CONTRAST:  165mL OMNIPAQUE IOHEXOL 300 MG/ML  SOLN COMPARISON:  2020 FINDINGS: Lower chest: No acute abnormality. Hepatobiliary: Unchanged small hepatic cysts cyst. Gallbladder is unremarkable. No biliary dilatation. Pancreas: Unremarkable. Spleen: Unremarkable. Adrenals/Urinary Tract: Adrenals are unremarkable. Bilateral renal cysts. Bladder is unremarkable. Stomach/Bowel: Stomach is within normal limits. Bowel is normal in caliber. Normal appendix. Vascular/Lymphatic: Aortic atherosclerosis. There are no enlarged lymph nodes identified. Reproductive: Calcified fibroids.  No adnexal mass. Other: No ascites. Fat  containing supraumbilical ventral hernia. Bowel extends up to but does not enter the hernia. Musculoskeletal: No acute osseous abnormality. IMPRESSION: No acute abnormality. Unchanged fat containing supraumbilical ventral hernia. Fibroid uterus. Aortic atherosclerosis. Electronically Signed   By: Macy Mis M.D.   On: 03/13/2020 12:04    Procedures Procedures (including critical care time)  Medications Ordered in ED Medications  sodium chloride 0.9 % bolus 1,000 mL (0 mLs Intravenous Stopped 03/13/20 1233)  ondansetron (ZOFRAN) injection 4 mg (4 mg Intravenous Given 03/13/20 1103)  morphine 4 MG/ML injection 4 mg (4 mg Intravenous Given 03/13/20 1102)  iohexol (OMNIPAQUE) 300 MG/ML solution 100 mL (100 mLs Intravenous Contrast Given 03/13/20 1136)  fentaNYL (SUBLIMAZE) injection 50 mcg (50 mcg Intravenous Given 03/13/20 1241)  ondansetron (ZOFRAN) injection 4 mg (4 mg Intravenous Given 03/13/20 1239)     Initial Impression / Assessment and Plan / ED Course  I have reviewed the triage vital signs and the nursing notes.  Pertinent labs & imaging results that were available during my care of the patient were reviewed by me and considered in my medical decision making (see chart for details).   Pt is a 69 year old female presenting for abdominal pain and emesis. PMH includes asthma, COPD, GERD, hx MI, hx hernia surgery. Pain began on Thursday 03/16/2992 and is periumbilical and does not radiate elsewhere, and describes as cramping and a tightening. She went to the movies and ate popcorn prior to sx onset on Thursday night. Has associated vomiting that is nonbloody and has about 10 episodes per day.   Concern for bowel obstruction vs hernia vs diverticulitis vs colitis vs pancreatitis. Will order CT ab pelvis w contrast to assess abdomen along with basic labs including lipase, and urine studies.  Will control pain with IV morphine and give zofran along with fluid bolus for hydration.   CMP  electrolytes WNL and LFTs WNL- less concern for gallbladder or liver etiology. Lipase WNL. CBC shows no white count elevation.  CT ab pelvis w con shows uterine fibroid- could possibly be having mass effect that is causing her abdominal pain and sx. Will need outpatient GYN referral and follow up. UA WNL.   Pt reassessed after PO challenge and reports less discomfort and pain. Says that she is ready to go home. Was able to have some crackers and soda without  vomiting, so will discharge home with outpt GYN fu. Pt is agreeable with plan and understands plan. Granddaughter at bedside at time of reeval and was updated and also understood and agreed to plan.    Final Clinical Impressions(s) / ED Diagnoses   Final diagnoses:  Periumbilical abdominal pain  Uterine leiomyoma, unspecified location    New Prescriptions New Prescriptions   No medications on file

## 2020-03-13 NOTE — ED Notes (Signed)
Pt given soda and graham crackers for PO challenge

## 2020-03-13 NOTE — ED Notes (Signed)
Pt aware of need for urine collection, unable to provide at this time.

## 2020-03-13 NOTE — ED Provider Notes (Signed)
[facility] EMERGENCY DEPARTMENT Provider Note   CSN: [id number] Arrival date & time: 03/13/20  0931     History Chief Complaint  Patient presents with  . Abdominal Pain  . Emesis    Vanessa Campos is a 69 y.o. female who presents for evaluation of periumbilical abdominal pain that began about 4 days ago.  She states initially it happened after she was at the movies.  She ate popcorn so she attributed to her pain to that but states that it has persisted.  She reports associated nausea/vomiting.  She reports that for the first 2 days, she was having multiple episodes of nonbloody, nonbilious emesis.  She does report that it has slowed down but she still has not been able to tolerate any p.o.  She has not had any diarrhea.  Her last bowel movement was about 4 days ago prior to onset of symptoms.  She does not think she has been passing flatus.  She has not tried anything at home.  She describes the pain as a cramping and tightening sensation.  She reports she has had a history of hernia surgery several years ago and tubal ligation 30 years ago.  No other prior abdominal surgery.  She denies any fevers, chest pain, difficulty breathing, dysuria, hematuria, cough, congestion, blood in stools.   The history is provided by the patient.       Past Medical History:  Diagnosis Date  . Asthma   . COPD (chronic obstructive pulmonary disease) ([place])   . Coronary artery disease   . DVT (deep venous thrombosis) ([place]) 2014   provoked, treated with 6 months warfarin  . GERD (gastroesophageal reflux disease)   . Hyperlipidemia   . Hypertension   . Tobacco abuse     Patient Active Problem List   Diagnosis Date Noted  . CAD (coronary artery disease) 11/08/2018  . NSTEMI (non-ST elevated myocardial infarction) ([place]) 11/02/2018  . COPD, mild ([place]) 02/02/2013  . Pulmonary embolism on right ([place]) 10/14/2012  . Hypotension, unspecified 10/11/2012  . Thyroid nodule 10/08/2012  .  Unspecified constipation 10/08/2012  . Tobacco use disorder 10/05/2012  . GERD (gastroesophageal reflux disease) 10/05/2012    Past Surgical History:  Procedure Laterality Date  . ABDOMINAL SURGERY    . CARPAL TUNNEL RELEASE    . LEFT HEART CATH AND CORONARY ANGIOGRAPHY N/A 11/05/2018   Procedure: LEFT HEART CATH AND CORONARY ANGIOGRAPHY;  Surgeon: Leonie Man, MD;  Location: [facility];  Service: Cardiovascular;  Laterality: N/A;     OB History   No obstetric history on file.     History reviewed. No pertinent family history.  Social History   Tobacco Use  . Smoking status: Current Every Day Smoker    Packs/day: 1.00    Years: 40.00    Pack years: 40.00    Types: Cigarettes  . Smokeless tobacco: Never Used  Vaping Use  . Vaping Use: Never used  Substance Use Topics  . Alcohol use: No    Alcohol/week: 0.0 standard drinks  . Drug use: No    Home Medications Prior to Admission medications   Medication Sig Start Date End Date Taking? Authorizing Provider  acetaminophen (TYLENOL) 325 MG tablet Take 650 mg by mouth every 6 (six) hours as needed (for pain.).    [provider]  aspirin EC 81 MG EC tablet Take 1 tablet (81 mg total) by mouth daily. 11/09/18   Eileen Stanford, PA-C  atorvastatin (LIPITOR)  40 MG tablet Take 1 tablet (40 mg total) by mouth daily at 6 PM. 11/08/18   Eileen Stanford, PA-C  clopidogrel (PLAVIX) 75 MG tablet Take 1 tablet (75 mg total) by mouth daily with breakfast. 11/09/18   Eileen Stanford, PA-C  diclofenac sodium (VOLTAREN) 1 % GEL Apply 2 g topically 4 (four) times daily as needed. Patient taking differently: Apply 2 g topically 4 (four) times daily as needed. Pain. 10/19/18   Ward, Ozella Almond, PA-C  isosorbide mononitrate (IMDUR) 30 MG 24 hr tablet Take 0.5 tablet (15 mg) by mouth for 4 days, if BP okay increase to 1 tablet (30 mg) 11/19/18   Troy Sine, MD  nitroGLYCERIN (NITROSTAT) 0.4 MG SL tablet Place 1  tablet (0.4 mg total) under the tongue every 5 (five) minutes x 3 doses as needed for chest pain. 11/08/18   Eileen Stanford, PA-C    Allergies    Hydrocodone-acetaminophen, Tramadol, and Vicodin [hydrocodone-acetaminophen]  Review of Systems   Review of Systems  Constitutional: Negative for fever.  Respiratory: Negative for cough and shortness of breath.   Cardiovascular: Negative for chest pain.  Gastrointestinal: Positive for abdominal pain, constipation, nausea and vomiting.  Genitourinary: Negative for dysuria and hematuria.  Neurological: Negative for headaches.  All other systems reviewed and are negative.   Physical Exam Updated Vital Signs BP 115/68 (BP Location: Left Arm)   Pulse (!) 56   Temp 97.6 F (36.4 C) (Oral)   Resp 16   SpO2 100%   Physical Exam Vitals and nursing note reviewed.  Constitutional:      Appearance: Normal appearance. She is well-developed.     Comments: Appears uncomfortable  HENT:     Head: Normocephalic and atraumatic.  Eyes:     General: Lids are normal.     Conjunctiva/sclera: Conjunctivae normal.     Pupils: Pupils are equal, round, and reactive to light.  Cardiovascular:     Rate and Rhythm: Normal rate and regular rhythm.     Pulses: Normal pulses.     Heart sounds: Normal heart sounds. No murmur heard.  No friction rub. No gallop.   Pulmonary:     Effort: Pulmonary effort is normal.     Breath sounds: Wheezing present.     Comments: Faint wheezing noted.  No evidence of respiratory distress. No rales.  Abdominal:     General: Bowel sounds are decreased.     Palpations: Abdomen is soft. Abdomen is not rigid.     Tenderness: There is abdominal tenderness in the periumbilical area. There is guarding.     Hernia: No hernia is present.     Comments: Hypoactive bowel sounds noted.  Abdomen is soft, nondistended.  Tenderness to palpation noted to the periumbilical region.  She does have some voluntary guarding.  No rigidity.  No  CVA tenderness noted bilaterally. No palpable hernia.   Musculoskeletal:        General: Normal range of motion.     Cervical back: Full passive range of motion without pain.  Skin:    General: Skin is warm and dry.     Capillary Refill: Capillary refill takes less than 2 seconds.  Neurological:     Mental Status: She is alert and oriented to person, place, and time.  Psychiatric:        Speech: Speech normal.     ED Results / Procedures / Treatments   Labs (all labs ordered are listed, but only abnormal results  are displayed) Labs Reviewed  COMPREHENSIVE METABOLIC PANEL - Abnormal; Notable for the following components:      Result Value   Glucose, Bld 116 (*)    All other components within normal limits  CBC WITH DIFFERENTIAL/PLATELET - Abnormal; Notable for the following components:   RBC 5.48 (*)    Hemoglobin 15.6 (*)    All other components within normal limits  RAPID URINE DRUG SCREEN, HOSP PERFORMED - Abnormal; Notable for the following components:   Opiates POSITIVE (*)    All other components within normal limits  LIPASE, BLOOD  URINALYSIS, ROUTINE W REFLEX MICROSCOPIC    EKG None  Radiology CT ABDOMEN PELVIS W CONTRAST  Result Date: 03/13/2020 CLINICAL DATA:  Diffuse abdominal pain EXAM: CT ABDOMEN AND PELVIS WITH CONTRAST TECHNIQUE: Multidetector CT imaging of the abdomen and pelvis was performed using the standard protocol following bolus administration of intravenous contrast. CONTRAST:  175mL OMNIPAQUE IOHEXOL 300 MG/ML  SOLN COMPARISON:  2020 FINDINGS: Lower chest: No acute abnormality. Hepatobiliary: Unchanged small hepatic cysts cyst. Gallbladder is unremarkable. No biliary dilatation. Pancreas: Unremarkable. Spleen: Unremarkable. Adrenals/Urinary Tract: Adrenals are unremarkable. Bilateral renal cysts. Bladder is unremarkable. Stomach/Bowel: Stomach is within normal limits. Bowel is normal in caliber. Normal appendix. Vascular/Lymphatic: Aortic  atherosclerosis. There are no enlarged lymph nodes identified. Reproductive: Calcified fibroids.  No adnexal mass. Other: No ascites. Fat containing supraumbilical ventral hernia. Bowel extends up to but does not enter the hernia. Musculoskeletal: No acute osseous abnormality. IMPRESSION: No acute abnormality. Unchanged fat containing supraumbilical ventral hernia. Fibroid uterus. Aortic atherosclerosis. Electronically Signed   By: Macy Mis M.D.   On: 03/13/2020 12:04    Procedures Procedures (including critical care time)  Medications Ordered in ED Medications  sodium chloride 0.9 % bolus 1,000 mL (0 mLs Intravenous Stopped 03/13/20 1233)  ondansetron (ZOFRAN) injection 4 mg (4 mg Intravenous Given 03/13/20 1103)  morphine 4 MG/ML injection 4 mg (4 mg Intravenous Given 03/13/20 1102)  iohexol (OMNIPAQUE) 300 MG/ML solution 100 mL (100 mLs Intravenous Contrast Given 03/13/20 1136)  fentaNYL (SUBLIMAZE) injection 50 mcg (50 mcg Intravenous Given 03/13/20 1241)  ondansetron (ZOFRAN) injection 4 mg (4 mg Intravenous Given 03/13/20 1239)    ED Course  I have reviewed the triage vital signs and the nursing notes.  Pertinent labs & imaging results that were available during my care of the patient were reviewed by me and considered in my medical decision making (see chart for details).    MDM Rules/Calculators/A&P                          69 year old female who presents for evaluation of abdominal pain x4 days.  Associated with nausea/vomiting.  No fevers.  Also reports that she has not had bowel movement since symptoms onset.  Does not recall she is passing flatus.  History of tubal ligation and hernia surgery.  On initial ED arrival, she appears uncomfortable.  She is afebrile but is slightly tachycardic.  Likely secondary to pain.  On exam, no palpable hernia noted.  She does have some decreased bowel sounds and tenderness in the peripelvic region.  Consider infectious etiology versus  diverticulitis versus small bowel obstruction versus hepatobiliary etiology though lower suspicion.  We will plan to check labs, imaging.  CBC shows no leukocytosis.  Hemoglobin is 15.6.  CMP shows no abnormalities with BUN or creatinine.  LFTs within normal limit.  Lipase is unremarkable.  UA negative for any infectious etiology.  UDS is positive for opiates.  CT on pelvis negative for any acute evidence of infection.  There is a fat-containing supraumbilical ventral hernia.  No evidence of bowel including the hernia.  There is mention of a uterine fibroid.  Reevaluation after pain medications.  Patient does appear more comfortable but still has some pain.  She has not been vomiting.  Will give additional analgesics and p.o. challenge.  Patient will tolerate p.o.  Repeat abdominal exam is improved.  She appears much more comfortable.  She is hemodynamically stable.  I discussed with her that her uterine fibroids could be contributing to pain I do not suspect that is the full etiology of her symptoms.  She does not have a OB/GYN follow-up.  We will give her outpatient OB/GYN.  At this time, her abdomen exam is improved and she is well-appearing. At this time, patient exhibits no emergent life-threatening condition that require further evaluation in ED or admission. Patient had ample opportunity for questions and discussion. All patient's questions were answered with full understanding. Strict return precautions discussed. Patient expresses understanding and agreement to plan.   Portions of this note were generated with [employer]. Dictation errors may occur despite best attempts at proofreading.   Final Clinical Impression(s) / ED Diagnoses Final diagnoses:  Periumbilical abdominal pain  Uterine leiomyoma, unspecified location    Rx / DC Orders ED Discharge Orders    None       Desma Mcgregor 03/13/20 1525    Breck Coons, MD 03/14/20 2113

## 2020-03-16 DIAGNOSIS — I10 Essential (primary) hypertension: Secondary | ICD-10-CM | POA: Diagnosis not present

## 2020-03-16 DIAGNOSIS — Z23 Encounter for immunization: Secondary | ICD-10-CM | POA: Diagnosis not present

## 2020-03-16 DIAGNOSIS — Z0000 Encounter for general adult medical examination without abnormal findings: Secondary | ICD-10-CM | POA: Diagnosis not present

## 2020-03-16 DIAGNOSIS — K429 Umbilical hernia without obstruction or gangrene: Secondary | ICD-10-CM | POA: Diagnosis not present

## 2020-03-16 DIAGNOSIS — D259 Leiomyoma of uterus, unspecified: Secondary | ICD-10-CM | POA: Diagnosis not present

## 2020-04-07 DIAGNOSIS — D251 Intramural leiomyoma of uterus: Secondary | ICD-10-CM | POA: Diagnosis not present

## 2020-04-07 DIAGNOSIS — R1033 Periumbilical pain: Secondary | ICD-10-CM | POA: Diagnosis not present

## 2020-04-07 DIAGNOSIS — D259 Leiomyoma of uterus, unspecified: Secondary | ICD-10-CM | POA: Diagnosis not present

## 2020-04-20 DIAGNOSIS — I25119 Atherosclerotic heart disease of native coronary artery with unspecified angina pectoris: Secondary | ICD-10-CM | POA: Diagnosis not present

## 2020-04-20 DIAGNOSIS — Z87891 Personal history of nicotine dependence: Secondary | ICD-10-CM | POA: Diagnosis not present

## 2020-04-20 DIAGNOSIS — R1033 Periumbilical pain: Secondary | ICD-10-CM | POA: Diagnosis not present

## 2020-04-20 DIAGNOSIS — I214 Non-ST elevation (NSTEMI) myocardial infarction: Secondary | ICD-10-CM | POA: Diagnosis not present

## 2020-04-20 DIAGNOSIS — R69 Illness, unspecified: Secondary | ICD-10-CM | POA: Diagnosis not present

## 2020-04-20 DIAGNOSIS — I2699 Other pulmonary embolism without acute cor pulmonale: Secondary | ICD-10-CM | POA: Diagnosis not present

## 2020-04-20 DIAGNOSIS — K429 Umbilical hernia without obstruction or gangrene: Secondary | ICD-10-CM | POA: Diagnosis not present

## 2020-05-09 DIAGNOSIS — K430 Incisional hernia with obstruction, without gangrene: Secondary | ICD-10-CM | POA: Diagnosis not present

## 2020-05-09 DIAGNOSIS — D175 Benign lipomatous neoplasm of intra-abdominal organs: Secondary | ICD-10-CM | POA: Diagnosis not present

## 2020-05-09 DIAGNOSIS — Z86718 Personal history of other venous thrombosis and embolism: Secondary | ICD-10-CM | POA: Diagnosis not present

## 2020-05-09 DIAGNOSIS — K660 Peritoneal adhesions (postprocedural) (postinfection): Secondary | ICD-10-CM | POA: Diagnosis not present

## 2020-05-09 DIAGNOSIS — Z6830 Body mass index (BMI) 30.0-30.9, adult: Secondary | ICD-10-CM | POA: Diagnosis not present

## 2020-05-09 DIAGNOSIS — E669 Obesity, unspecified: Secondary | ICD-10-CM | POA: Diagnosis not present

## 2020-05-09 DIAGNOSIS — Z7902 Long term (current) use of antithrombotics/antiplatelets: Secondary | ICD-10-CM | POA: Diagnosis not present

## 2020-05-09 DIAGNOSIS — I252 Old myocardial infarction: Secondary | ICD-10-CM | POA: Diagnosis not present

## 2020-05-09 DIAGNOSIS — K429 Umbilical hernia without obstruction or gangrene: Secondary | ICD-10-CM | POA: Diagnosis not present

## 2020-05-09 DIAGNOSIS — D259 Leiomyoma of uterus, unspecified: Secondary | ICD-10-CM | POA: Diagnosis not present

## 2020-05-09 DIAGNOSIS — Z780 Asymptomatic menopausal state: Secondary | ICD-10-CM | POA: Diagnosis not present

## 2020-05-09 DIAGNOSIS — Z23 Encounter for immunization: Secondary | ICD-10-CM | POA: Diagnosis not present

## 2020-05-10 DIAGNOSIS — K660 Peritoneal adhesions (postprocedural) (postinfection): Secondary | ICD-10-CM | POA: Diagnosis not present

## 2020-05-10 DIAGNOSIS — Z6830 Body mass index (BMI) 30.0-30.9, adult: Secondary | ICD-10-CM | POA: Diagnosis not present

## 2020-05-10 DIAGNOSIS — D259 Leiomyoma of uterus, unspecified: Secondary | ICD-10-CM | POA: Diagnosis not present

## 2020-05-10 DIAGNOSIS — E669 Obesity, unspecified: Secondary | ICD-10-CM | POA: Diagnosis not present

## 2020-05-10 DIAGNOSIS — I252 Old myocardial infarction: Secondary | ICD-10-CM | POA: Diagnosis not present

## 2020-05-10 DIAGNOSIS — Z23 Encounter for immunization: Secondary | ICD-10-CM | POA: Diagnosis not present

## 2020-05-10 DIAGNOSIS — K429 Umbilical hernia without obstruction or gangrene: Secondary | ICD-10-CM | POA: Diagnosis not present

## 2020-05-10 DIAGNOSIS — Z7902 Long term (current) use of antithrombotics/antiplatelets: Secondary | ICD-10-CM | POA: Diagnosis not present

## 2020-05-10 DIAGNOSIS — Z780 Asymptomatic menopausal state: Secondary | ICD-10-CM | POA: Diagnosis not present

## 2020-05-10 DIAGNOSIS — Z86718 Personal history of other venous thrombosis and embolism: Secondary | ICD-10-CM | POA: Diagnosis not present

## 2020-05-11 DIAGNOSIS — Z86718 Personal history of other venous thrombosis and embolism: Secondary | ICD-10-CM | POA: Diagnosis not present

## 2020-05-11 DIAGNOSIS — K429 Umbilical hernia without obstruction or gangrene: Secondary | ICD-10-CM | POA: Diagnosis not present

## 2020-05-11 DIAGNOSIS — D259 Leiomyoma of uterus, unspecified: Secondary | ICD-10-CM | POA: Diagnosis not present

## 2020-05-11 DIAGNOSIS — Z6830 Body mass index (BMI) 30.0-30.9, adult: Secondary | ICD-10-CM | POA: Diagnosis not present

## 2020-05-11 DIAGNOSIS — Z7902 Long term (current) use of antithrombotics/antiplatelets: Secondary | ICD-10-CM | POA: Diagnosis not present

## 2020-05-11 DIAGNOSIS — K660 Peritoneal adhesions (postprocedural) (postinfection): Secondary | ICD-10-CM | POA: Diagnosis not present

## 2020-05-11 DIAGNOSIS — Z780 Asymptomatic menopausal state: Secondary | ICD-10-CM | POA: Diagnosis not present

## 2020-05-11 DIAGNOSIS — E669 Obesity, unspecified: Secondary | ICD-10-CM | POA: Diagnosis not present

## 2020-05-11 DIAGNOSIS — I252 Old myocardial infarction: Secondary | ICD-10-CM | POA: Diagnosis not present

## 2020-05-11 DIAGNOSIS — Z23 Encounter for immunization: Secondary | ICD-10-CM | POA: Diagnosis not present

## 2020-05-12 DIAGNOSIS — Z23 Encounter for immunization: Secondary | ICD-10-CM | POA: Diagnosis not present

## 2020-05-12 DIAGNOSIS — K429 Umbilical hernia without obstruction or gangrene: Secondary | ICD-10-CM | POA: Diagnosis not present

## 2020-05-12 DIAGNOSIS — Z7902 Long term (current) use of antithrombotics/antiplatelets: Secondary | ICD-10-CM | POA: Diagnosis not present

## 2020-05-12 DIAGNOSIS — D259 Leiomyoma of uterus, unspecified: Secondary | ICD-10-CM | POA: Diagnosis not present

## 2020-05-12 DIAGNOSIS — K660 Peritoneal adhesions (postprocedural) (postinfection): Secondary | ICD-10-CM | POA: Diagnosis not present

## 2020-05-12 DIAGNOSIS — Z780 Asymptomatic menopausal state: Secondary | ICD-10-CM | POA: Diagnosis not present

## 2020-05-12 DIAGNOSIS — Z6830 Body mass index (BMI) 30.0-30.9, adult: Secondary | ICD-10-CM | POA: Diagnosis not present

## 2020-05-12 DIAGNOSIS — E669 Obesity, unspecified: Secondary | ICD-10-CM | POA: Diagnosis not present

## 2020-05-12 DIAGNOSIS — Z86718 Personal history of other venous thrombosis and embolism: Secondary | ICD-10-CM | POA: Diagnosis not present

## 2020-05-12 DIAGNOSIS — I252 Old myocardial infarction: Secondary | ICD-10-CM | POA: Diagnosis not present

## 2020-05-17 DIAGNOSIS — I10 Essential (primary) hypertension: Secondary | ICD-10-CM | POA: Diagnosis not present

## 2020-05-17 DIAGNOSIS — E785 Hyperlipidemia, unspecified: Secondary | ICD-10-CM | POA: Diagnosis not present

## 2020-05-17 DIAGNOSIS — Z9889 Other specified postprocedural states: Secondary | ICD-10-CM | POA: Diagnosis not present

## 2020-05-17 DIAGNOSIS — R079 Chest pain, unspecified: Secondary | ICD-10-CM | POA: Diagnosis not present

## 2020-05-17 DIAGNOSIS — Z7901 Long term (current) use of anticoagulants: Secondary | ICD-10-CM | POA: Diagnosis not present

## 2020-05-17 DIAGNOSIS — I998 Other disorder of circulatory system: Secondary | ICD-10-CM | POA: Diagnosis not present

## 2020-05-17 DIAGNOSIS — Z7902 Long term (current) use of antithrombotics/antiplatelets: Secondary | ICD-10-CM | POA: Diagnosis not present

## 2020-05-17 DIAGNOSIS — R0789 Other chest pain: Secondary | ICD-10-CM | POA: Diagnosis not present

## 2020-05-17 DIAGNOSIS — K219 Gastro-esophageal reflux disease without esophagitis: Secondary | ICD-10-CM | POA: Diagnosis not present

## 2020-05-17 DIAGNOSIS — Z7982 Long term (current) use of aspirin: Secondary | ICD-10-CM | POA: Diagnosis not present

## 2020-05-17 DIAGNOSIS — N281 Cyst of kidney, acquired: Secondary | ICD-10-CM | POA: Diagnosis not present

## 2020-05-17 DIAGNOSIS — I2699 Other pulmonary embolism without acute cor pulmonale: Secondary | ICD-10-CM | POA: Diagnosis not present

## 2020-05-17 DIAGNOSIS — R0602 Shortness of breath: Secondary | ICD-10-CM | POA: Diagnosis not present

## 2020-05-17 DIAGNOSIS — I252 Old myocardial infarction: Secondary | ICD-10-CM | POA: Diagnosis not present

## 2020-05-17 DIAGNOSIS — J9811 Atelectasis: Secondary | ICD-10-CM | POA: Diagnosis not present

## 2020-05-17 DIAGNOSIS — R059 Cough, unspecified: Secondary | ICD-10-CM | POA: Diagnosis not present

## 2020-05-17 DIAGNOSIS — I2510 Atherosclerotic heart disease of native coronary artery without angina pectoris: Secondary | ICD-10-CM | POA: Diagnosis not present

## 2020-05-17 DIAGNOSIS — I2694 Multiple subsegmental pulmonary emboli without acute cor pulmonale: Secondary | ICD-10-CM | POA: Diagnosis not present

## 2020-05-19 DIAGNOSIS — J9601 Acute respiratory failure with hypoxia: Secondary | ICD-10-CM | POA: Diagnosis not present

## 2020-05-19 DIAGNOSIS — I2694 Multiple subsegmental pulmonary emboli without acute cor pulmonale: Secondary | ICD-10-CM | POA: Diagnosis not present

## 2020-05-19 DIAGNOSIS — I2699 Other pulmonary embolism without acute cor pulmonale: Secondary | ICD-10-CM | POA: Diagnosis not present

## 2020-05-30 DIAGNOSIS — Z7901 Long term (current) use of anticoagulants: Secondary | ICD-10-CM | POA: Diagnosis not present

## 2020-05-30 DIAGNOSIS — I2694 Multiple subsegmental pulmonary emboli without acute cor pulmonale: Secondary | ICD-10-CM | POA: Diagnosis not present

## 2020-05-30 DIAGNOSIS — R69 Illness, unspecified: Secondary | ICD-10-CM | POA: Diagnosis not present

## 2020-05-30 DIAGNOSIS — Z7902 Long term (current) use of antithrombotics/antiplatelets: Secondary | ICD-10-CM | POA: Diagnosis not present

## 2020-05-30 DIAGNOSIS — I959 Hypotension, unspecified: Secondary | ICD-10-CM | POA: Diagnosis not present

## 2020-06-03 DIAGNOSIS — E782 Mixed hyperlipidemia: Secondary | ICD-10-CM | POA: Diagnosis not present

## 2020-06-03 DIAGNOSIS — I25119 Atherosclerotic heart disease of native coronary artery with unspecified angina pectoris: Secondary | ICD-10-CM | POA: Diagnosis not present

## 2020-06-03 DIAGNOSIS — I214 Non-ST elevation (NSTEMI) myocardial infarction: Secondary | ICD-10-CM | POA: Diagnosis not present

## 2020-06-03 DIAGNOSIS — Z87891 Personal history of nicotine dependence: Secondary | ICD-10-CM | POA: Diagnosis not present

## 2020-06-03 DIAGNOSIS — J449 Chronic obstructive pulmonary disease, unspecified: Secondary | ICD-10-CM | POA: Diagnosis not present

## 2022-09-23 ENCOUNTER — Emergency Department (HOSPITAL_BASED_OUTPATIENT_CLINIC_OR_DEPARTMENT_OTHER): Payer: Medicare HMO

## 2022-09-23 ENCOUNTER — Emergency Department (HOSPITAL_BASED_OUTPATIENT_CLINIC_OR_DEPARTMENT_OTHER)
Admission: EM | Admit: 2022-09-23 | Discharge: 2022-09-23 | Disposition: A | Discharge status: Home / Self Care | Payer: Medicare HMO | Attending: Emergency Medicine | Admitting: Emergency Medicine

## 2022-09-23 ENCOUNTER — Other Ambulatory Visit: Payer: Self-pay

## 2022-09-23 DIAGNOSIS — F17200 Nicotine dependence, unspecified, uncomplicated: Secondary | ICD-10-CM | POA: Diagnosis not present

## 2022-09-23 DIAGNOSIS — J449 Chronic obstructive pulmonary disease, unspecified: Secondary | ICD-10-CM | POA: Insufficient documentation

## 2022-09-23 DIAGNOSIS — M5431 Sciatica, right side: Secondary | ICD-10-CM | POA: Insufficient documentation

## 2022-09-23 DIAGNOSIS — Z7982 Long term (current) use of aspirin: Secondary | ICD-10-CM | POA: Insufficient documentation

## 2022-09-23 DIAGNOSIS — Z7902 Long term (current) use of antithrombotics/antiplatelets: Secondary | ICD-10-CM | POA: Diagnosis not present

## 2022-09-23 DIAGNOSIS — M79661 Pain in right lower leg: Secondary | ICD-10-CM

## 2022-09-23 MED ORDER — ACETAMINOPHEN 325 MG PO TABS
650.0000 mg | ORAL_TABLET | Freq: Once | ORAL | Status: DC
  Filled 2022-09-23: qty 2

## 2022-09-23 MED ORDER — METHOCARBAMOL 500 MG PO TABS
500.0000 mg | ORAL_TABLET | Freq: Once | ORAL | Status: AC
  Administered 2022-09-23: 500 mg via ORAL
  Filled 2022-09-23: qty 1

## 2022-09-23 MED ORDER — METHOCARBAMOL 500 MG PO TABS: 500.0000 mg | ORAL_TABLET | Freq: Two times a day (BID) | ORAL | 0 refills | Status: AC

## 2022-09-23 MED ORDER — OXYCODONE-ACETAMINOPHEN 5-325 MG PO TABS
1.0000 | ORAL_TABLET | Freq: Once | ORAL | Status: AC
  Administered 2022-09-23: 1 via ORAL
  Filled 2022-09-23: qty 1
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
## 2022-09-23 NOTE — Discharge Instructions (Addendum)

## 2022-09-23 NOTE — ED Provider Notes (Signed)
[facility] Provider Note   CSN: [id number] Arrival date & time: 09/23/22  1632     History  Chief Complaint  Patient presents with   Leg Pain    Vanessa Campos is a 72 y.o. female with past medical history significant for tobacco abuse, acid reflux, previous DVT, PE, COPD, previous NSTEMI who presents with concern for right lower leg pain that started last night.  Patient reports that she is post to be anticoagulated, but has not been taking her blood thinner because she was supposed to have an upcoming procedure, they ended up discontinuing this or postponing it but she has not taken her blood thinner in several days.  She denies any known injury.  She denies any history of back pain or sciatica that felt similar to this in the past.  Any numbness, tingling.  She took some ibuprofen earlier today without significant relief of her pain.   Leg Pain      Home Medications Prior to Admission medications   Medication Sig Start Date End Date Taking? Authorizing Provider  methocarbamol (ROBAXIN) 500 MG tablet Take 1 tablet (500 mg total) by mouth 2 (two) times daily. 09/23/22  Yes Randee Upchurch H, PA-C  acetaminophen (TYLENOL) 325 MG tablet Take 650 mg by mouth every 6 (six) hours as needed (for pain.).    [provider]  aspirin EC 81 MG EC tablet Take 1 tablet (81 mg total) by mouth daily. 11/09/18   Eileen Stanford, PA-C  atorvastatin (LIPITOR) 40 MG tablet Take 1 tablet (40 mg total) by mouth daily at 6 PM. 11/08/18   Eileen Stanford, PA-C  clopidogrel (PLAVIX) 75 MG tablet Take 1 tablet (75 mg total) by mouth daily with breakfast. 11/09/18   Eileen Stanford, PA-C  diclofenac sodium (VOLTAREN) 1 % GEL Apply 2 g topically 4 (four) times daily as needed. Patient taking differently: Apply 2 g topically 4 (four) times daily as needed. Pain. 10/19/18   Ward, Ozella Almond, PA-C  isosorbide mononitrate (IMDUR) 30 MG 24  hr tablet Take 0.5 tablet (15 mg) by mouth for 4 days, if BP okay increase to 1 tablet (30 mg) 11/19/18   Troy Sine, MD  nitroGLYCERIN (NITROSTAT) 0.4 MG SL tablet Place 1 tablet (0.4 mg total) under the tongue every 5 (five) minutes x 3 doses as needed for chest pain. 11/08/18   Eileen Stanford, PA-C      Allergies    Tramadol and Vicodin [hydrocodone-acetaminophen]    Review of Systems   Review of Systems  All other systems reviewed and are negative.   Physical Exam Updated Vital Signs BP 134/75   Pulse 81   Temp 98.2 F (36.8 C) (Oral)   Resp 19   Wt 73.5 kg   SpO2 100%   BMI 28.70 kg/m  Physical Exam Vitals and nursing note reviewed.  Constitutional:      General: She is not in acute distress.    Appearance: Normal appearance.  HENT:     Head: Normocephalic and atraumatic.  Eyes:     General:        Right eye: No discharge.        Left eye: No discharge.  Cardiovascular:     Rate and Rhythm: Normal rate and regular rhythm.     Pulses: Normal pulses.  Pulmonary:     Effort: Pulmonary effort is normal. No respiratory distress.  Musculoskeletal:  General: No deformity.     Comments: Positive straight leg raise on right, no noticeable swelling behind the calf, but some tenderness to palpation of the calf, posterior knee.  Normal range of motion to flexion, extension at the knee on the right, flexion, extension at the right hip.  Skin:    General: Skin is warm and dry.     Capillary Refill: Capillary refill takes less than 2 seconds.  Neurological:     Mental Status: She is alert and oriented to person, place, and time.  Psychiatric:        Mood and Affect: Mood normal.        Behavior: Behavior normal.     ED Results / Procedures / Treatments   Labs (all labs ordered are listed, but only abnormal results are displayed) Labs Reviewed - No data to display  EKG None  Radiology US Venous Img Lower Right (DVT Study)  Result Date:  09/23/2022 CLINICAL DATA:  Right calf pain, right leg swelling EXAM: RIGHT LOWER EXTREMITY VENOUS DOPPLER ULTRASOUND TECHNIQUE: Gray-scale sonography with compression, as well as color and duplex ultrasound, were performed to evaluate the deep venous system(s) from the level of the common femoral vein through the popliteal and proximal calf veins. COMPARISON:  None Available. FINDINGS: VENOUS Normal compressibility of the common femoral, superficial femoral, and popliteal veins, as well as the visualized calf veins. The popliteal vein appears aneurysmal but demonstrates no intraluminal thrombus or mural thickening. Visualized portions of profunda femoral vein and great saphenous vein unremarkable. No filling defects to suggest DVT on grayscale or color Doppler imaging. Doppler waveforms show normal direction of venous flow, normal respiratory plasticity and response to augmentation. Limited views of the contralateral common femoral vein are unremarkable. OTHER None. Limitations: none IMPRESSION: No femoropopliteal DVT within the right lower extremity. 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 Electronically Signed   By: Fidela Salisbury M.D.   On: 09/23/2022 17:59    Procedures Procedures    Medications Ordered in ED Medications  methocarbamol (ROBAXIN) tablet 500 mg (has no administration in time range)  oxyCODONE-acetaminophen (PERCOCET/ROXICET) 5-325 MG per tablet 1 tablet (1 tablet Oral Given 09/23/22 1741)    ED Course/ Medical Decision Making/ A&P                             Medical Decision Making Risk Prescription drug management.   This patient is a 72 y.o. female who presents to the ED for concern of right calf pain, with recently not taking her blood thinner medication.   Differential diagnoses prior to evaluation: DVT, sciatica, other muscular or soft tissue injury of the right calf, low back related injury, or other vascular  abnormality  Past Medical History / Social History / Additional history: Chart reviewed. Pertinent results include: History of tobacco use, COPD, previous PE, DVT  Physical Exam: Physical exam performed. The pertinent findings include: No significant swelling of the right calf, she has positive straight leg raise on the right consistent with possible early developing sciatica  Medications / Treatment: Treated in the emergency department with Percocet, and Robaxin, will discharge with Robaxin, encouraged   Disposition: After consideration of the diagnostic results and the patients response to treatment, I feel that patient is stable for discharge at this time, with suspected right leg pain related to sciatica, no evidence of acute DVT, independently interpreted ultrasound of the right lower extremity  shows no evidence of DVT, some aneurysmal dilation of popliteal vein noted.   emergency department workup does not suggest an emergent condition requiring admission or immediate intervention beyond what has been performed at this time. The plan is: as above. The patient is safe for discharge and has been instructed to return immediately for worsening symptoms, change in symptoms or any other concerns.  Final Clinical Impression(s) / ED Diagnoses Final diagnoses:  Right calf pain  Sciatica of right side    Rx / DC Orders ED Discharge Orders          Ordered    methocarbamol (ROBAXIN) 500 MG tablet  2 times daily        09/23/22 1841              Dorien Chihuahua 09/23/22 1849    Gareth Morgan, MD 09/24/22 1139

## 2023-01-29 ENCOUNTER — Ambulatory Visit: Payer: Medicare Other | Attending: Cardiovascular Disease | Admitting: Cardiovascular Disease

## 2023-01-29 ENCOUNTER — Encounter: Payer: Self-pay | Admitting: Cardiovascular Disease

## 2023-01-29 VITALS — BP 100/64 | HR 62 | Ht 63.0 in | Wt 145.8 lb

## 2023-01-29 DIAGNOSIS — I2699 Other pulmonary embolism without acute cor pulmonale: Secondary | ICD-10-CM | POA: Diagnosis not present

## 2023-01-29 DIAGNOSIS — F17200 Nicotine dependence, unspecified, uncomplicated: Secondary | ICD-10-CM | POA: Diagnosis not present

## 2023-01-29 DIAGNOSIS — I739 Peripheral vascular disease, unspecified: Secondary | ICD-10-CM | POA: Diagnosis not present

## 2023-01-29 DIAGNOSIS — I214 Non-ST elevation (NSTEMI) myocardial infarction: Secondary | ICD-10-CM | POA: Diagnosis not present

## 2023-01-29 MED ORDER — CILOSTAZOL 50 MG PO TABS: 50.0000 mg | ORAL_TABLET | Freq: Two times a day (BID) | ORAL | 3 refills | Status: DC

## 2023-01-29 NOTE — Assessment & Plan Note (Signed)
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

## 2023-01-29 NOTE — Progress Notes (Signed)
01/29/2023 Vanessa Campos   Feb 29, 1951  [id number]  Primary Physician Virgilio Belling, PA-C Primary Cardiologist: Runell Gess MD Nicholes Calamity, [place]  HPI:  Vanessa Campos is a 72 y.o.  single African-American female mother of 2 children, grandmother of 5 grandchildren who is accompanied by one of her granddaughters, Vanessa Campos.  She is retired in 2015 from working for the city of [employer] doing [employer]or and [employer], fairly labor-intensive work.  She was referred by her podiatrist, Dr. Fanny Dance, for peripheral vascular evaluation and clearance before anticipated foot surgery.  Her risk factors include 25 pack years tobacco abuse having smoked 1/2 pack a day for last 50 years, and recalcitrant to resector modification.  She has treated hyperlipidemia, and possibly hypertension.  She is not diabetic.  She does have COPD.  Several siblings have had heart attacks and stents.  She is never had a stroke.  She has had DVTs and pulmonary emboli on Eliquis oral anticoagulation.  She is complain of symmetric Claudication for last 4 years which does not necessarily affect her ability to perform her activities of daily living.   Current Meds  Medication Sig   acetaminophen (TYLENOL) 325 MG tablet Take 650 mg by mouth every 6 (six) hours as needed (for pain.).   apixaban (ELIQUIS) 2.5 MG TABS tablet Take 2.5 mg by mouth 2 (two) times daily.   atorvastatin (LIPITOR) 40 MG tablet Take 1 tablet (40 mg total) by mouth daily at 6 PM.   buPROPion (WELLBUTRIN SR) 150 MG 12 hr tablet Take 150 mg by mouth 2 (two) times daily.   cilostazol (PLETAL) 50 MG tablet Take 1 tablet (50 mg total) by mouth 2 (two) times daily.   diclofenac sodium (VOLTAREN) 1 % GEL Apply 2 g topically 4 (four) times daily as needed. (Patient taking differently: Apply 2 g topically 4 (four) times daily as needed. Pain.)   ezetimibe (ZETIA) 10 MG tablet Take 10 mg by mouth daily.   gabapentin (NEURONTIN) 100 MG capsule Take 100 mg  by mouth at bedtime.   ibuprofen (ADVIL) 200 MG tablet Take 200 mg by mouth as needed for mild pain.   isosorbide mononitrate (IMDUR) 30 MG 24 hr tablet Take 0.5 tablet (15 mg) by mouth for 4 days, if BP okay increase to 1 tablet (30 mg)   Lidocaine 0.5 % GEL 4 %.   methocarbamol (ROBAXIN) 500 MG tablet Take 1 tablet (500 mg total) by mouth 2 (two) times daily.   metoprolol succinate (TOPROL-XL) 25 MG 24 hr tablet Take 25 mg by mouth daily.   nitroGLYCERIN (NITROSTAT) 0.4 MG SL tablet Place 1 tablet (0.4 mg total) under the tongue every 5 (five) minutes x 3 doses as needed for chest pain.   polyethylene glycol powder (GLYCOLAX/MIRALAX) 17 GM/SCOOP powder Take 0.5 Containers by mouth as needed for mild constipation, moderate constipation or severe constipation.   Vitamin E 45 MG (100 UNIT) CAPS Take 200 Units by mouth daily.     Allergies  Allergen Reactions   Tramadol     shaking   Vicodin [Hydrocodone-Acetaminophen] Other (See Comments)    Shaking     Social History   Socioeconomic History   Marital status: Single    Spouse name: Not on file   Number of children: Not on file   Years of education: Not on file   Highest education level: Not on file  Occupational History   Not on file  Tobacco Use  Smoking status: Every Day    Packs/day: 1.00    Years: 40.00    Additional pack years: 0.00    Total pack years: 40.00    Types: Cigarettes   Smokeless tobacco: Never  Vaping Use   Vaping Use: Never used  Substance and Sexual Activity   Alcohol use: No    Alcohol/week: 0.0 standard drinks of alcohol   Drug use: No   Sexual activity: Not on file  Other Topics Concern   Not on file  Social History Narrative   Not on file   Social Determinants of Health   Financial Resource Strain: Low Risk  (11/03/2018)   Overall Financial Resource Strain (CARDIA)    Difficulty of Paying Living Expenses: Not hard at all  Food Insecurity: No Food Insecurity (11/03/2018)   Hunger Vital Sign     Worried About Running Out of Food in the Last Year: Never true    Ran Out of Food in the Last Year: Never true  Transportation Needs: No Transportation Needs (11/03/2018)   PRAPARE - Administrator, Civil Service (Medical): No    Lack of Transportation (Non-Medical): No  Physical Activity: Not on file  Stress: Not on file  Social Connections: Not on file  Intimate Partner Violence: Not on file     Review of Systems: General: negative for chills, fever, night sweats or weight changes.  Cardiovascular: negative for chest pain, dyspnea on exertion, edema, orthopnea, palpitations, paroxysmal nocturnal dyspnea or shortness of breath Dermatological: negative for rash Respiratory: negative for cough or wheezing Urologic: negative for hematuria Abdominal: negative for nausea, vomiting, diarrhea, bright red blood per rectum, melena, or hematemesis Neurologic: negative for visual changes, syncope, or dizziness All other systems reviewed and are otherwise negative except as noted above.    Blood pressure 100/64, pulse 62, height 5\' 3"  (1.6 m), weight 145 lb 12.8 oz (66.1 kg), SpO2 91 %.  General appearance: alert and no distress Neck: no adenopathy, no carotid bruit, no JVD, supple, symmetrical, trachea midline, and thyroid not enlarged, symmetric, no tenderness/mass/nodules Lungs: clear to auscultation bilaterally Heart: regular rate and rhythm, S1, S2 normal, no murmur, click, rub or gallop Extremities: extremities normal, atraumatic, no cyanosis or edema Pulses: Absent pedal pulses Skin: Skin color, texture, turgor normal. No rashes or lesions Neurologic: Grossly normal  EKG EKG Interpretation Date/Time:  Tuesday January 29 2023 09:43:26 EDT Ventricular Rate:  62 PR Interval:  206 QRS Duration:  78 QT Interval:  428 QTC Calculation: 434 R Axis:   -11  Text Interpretation: Normal sinus rhythm Normal ECG When compared with ECG of 08-Nov-2018 05:11, PR interval has decreased  Confirmed by Nanetta Batty [phone]) on 01/29/2023 10:19:41 AM    ASSESSMENT AND PLAN:   Tobacco use disorder Ongoing tobacco abuse of 1/2 pack/day for the last 50 years recalcitrant to resector modification.  Pulmonary embolism on right [facility]) History of pulmonary embolism and DVT on Eliquis oral anticoagulation indefinitely  NSTEMI (non-ST elevated myocardial infarction) (HCC) History of non-STEMI 12/08/2018 where she presented with abdominal pain and chest pain.  Troponins were elevated at 6.  She underwent cardiac catheterization by Dr. 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  She was treated conservatively.  She has had no recurrent symptoms.  Peripheral arterial disease (HCC) Patient was referred by Dr.Jah for peripheral vascular evaluation and clearance prior to podiatry/surgery on her foot.  She does give a history of pain at night in her legs when  she sleeps in bed but also with ambulation.  This began 4 years ago at the time of her cardiac event.  It is symmetric but does not necessarily affect her quality of life.  She has no evidence of critical limb ischemia.  She has diminished pedal pulses on exam.  I will start her on Pletal and obtain lower extremity arterial Dopplers to evaluate.     Runell Gess MD FACP,FACC,FAHA, [facility] 01/29/2023 10:20 AM

## 2023-01-29 NOTE — Assessment & Plan Note (Signed)
Ongoing tobacco abuse of 1/2 pack/day for the last 50 years recalcitrant to resector modification.

## 2023-01-29 NOTE — Patient Instructions (Signed)
Medication Instructions:  Your physician has recommended you make the following change in your medication:   -Start cilostazol (pletal) 50mg  twice daily.  *If you need a refill on your cardiac medications before your next appointment, please call your pharmacy*    Testing/Procedures: Your physician has requested that you have a lower extremity arterial duplex. During this test, ultrasound is used to evaluate arterial blood flow in the legs. Allow one hour for this exam. There are no restrictions or special instructions. This will take place at [address] [facility], [address].  Your physician has requested that you have an ankle brachial index (ABI). During this test an ultrasound and blood pressure cuff are used to evaluate the arteries that supply the arms and legs with blood. Allow thirty minutes for this exam. There are no restrictions or special instructions. This will take place at [address] [facility], [address].      Follow-Up: At [facility], you and your health needs are our priority.  As part of our continuing mission to provide you with exceptional heart care, we have created designated Provider Care Teams.  These Care Teams include your primary Cardiologist (physician) and Advanced Practice Providers (APPs -  Physician Assistants and Nurse Practitioners) who all work together to provide you with the care you need, when you need it.  We recommend signing up for the patient portal called "MyChart".  Sign up information is provided on this After Visit Summary.  MyChart is used to connect with patients for Virtual Visits (Telemedicine).  Patients are able to view lab/test results, encounter notes, upcoming appointments, etc.  Non-urgent messages can be sent to your provider as well.   To learn more about what you can do with MyChart, go to [email or web address].    Your next appointment:   3 month(s)  Provider:   Nanetta Batty, MD

## 2023-01-29 NOTE — Assessment & Plan Note (Signed)
Patient was referred by Dr.Jah for peripheral vascular evaluation and clearance prior to podiatry/surgery on her foot.  She does give a history of pain at night in her legs when she sleeps in bed but also with ambulation.  This began 4 years ago at the time of her cardiac event.  It is symmetric but does not necessarily affect her quality of life.  She has no evidence of critical limb ischemia.  She has diminished pedal pulses on exam.  I will start her on Pletal and obtain lower extremity arterial Dopplers to evaluate.

## 2023-01-29 NOTE — Assessment & Plan Note (Signed)
History of non-STEMI 12/08/2018 where she presented with abdominal pain and chest pain.  Troponins were elevated at 6.  She underwent cardiac catheterization by Dr. 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  She was treated conservatively.  She has had no recurrent symptoms.

## 2023-02-20 ENCOUNTER — Ambulatory Visit (HOSPITAL_COMMUNITY): Admission: RE | Admit: 2023-02-20 | Payer: Medicare Other | Source: Ambulatory Visit

## 2023-02-25 ENCOUNTER — Ambulatory Visit (HOSPITAL_COMMUNITY)
Admission: RE | Admit: 2023-02-25 | Payer: Medicare Other | Source: Ambulatory Visit | Attending: Cardiovascular Disease | Admitting: Cardiovascular Disease

## 2023-03-07 ENCOUNTER — Telehealth (HOSPITAL_BASED_OUTPATIENT_CLINIC_OR_DEPARTMENT_OTHER): Payer: Self-pay | Admitting: Cardiovascular Disease

## 2023-03-07 ENCOUNTER — Ambulatory Visit (HOSPITAL_COMMUNITY): Admission: RE | Admit: 2023-03-07 | Payer: Medicare Other | Source: Ambulatory Visit

## 2023-03-07 NOTE — Telephone Encounter (Signed)
Left message for patient to call and discuss earlier appointment time for Friday 03/08/23 fir the lower arterial doppler study at [facility]----requests patient come in at 2:00 pm instead of 3 (appointment length to be changed to 120 minutes as well)

## 2023-03-07 NOTE — Telephone Encounter (Signed)
Spoke with [name] (granddaughter) regarding the Lower arterial doppler scheduled 03/08/23 at 3:00 pm---patient is aware the appointment time has been changed to 2:00 pm--arrival time is 1:45 pm for check in.

## 2023-03-08 ENCOUNTER — Ambulatory Visit (HOSPITAL_COMMUNITY)
Admission: RE | Admit: 2023-03-08 | Discharge: 2023-03-08 | Disposition: A | Discharge status: Home / Self Care | Payer: Medicare Other | Source: Ambulatory Visit | Attending: Cardiovascular Disease | Admitting: Cardiovascular Disease

## 2023-03-08 DIAGNOSIS — I2699 Other pulmonary embolism without acute cor pulmonale: Secondary | ICD-10-CM

## 2023-03-08 DIAGNOSIS — I214 Non-ST elevation (NSTEMI) myocardial infarction: Secondary | ICD-10-CM | POA: Diagnosis not present

## 2023-03-08 DIAGNOSIS — F17200 Nicotine dependence, unspecified, uncomplicated: Secondary | ICD-10-CM | POA: Diagnosis not present

## 2023-03-08 DIAGNOSIS — I739 Peripheral vascular disease, unspecified: Secondary | ICD-10-CM | POA: Diagnosis not present

## 2023-05-06 ENCOUNTER — Encounter: Payer: Self-pay | Admitting: Cardiovascular Disease

## 2023-05-06 ENCOUNTER — Ambulatory Visit: Payer: Medicare Other | Attending: Cardiovascular Disease | Admitting: Cardiovascular Disease

## 2023-05-06 VITALS — BP 124/72 | HR 62 | Ht 63.0 in | Wt 145.4 lb

## 2023-05-06 DIAGNOSIS — F17200 Nicotine dependence, unspecified, uncomplicated: Secondary | ICD-10-CM | POA: Diagnosis not present

## 2023-05-06 DIAGNOSIS — I739 Peripheral vascular disease, unspecified: Secondary | ICD-10-CM | POA: Diagnosis not present

## 2023-05-06 DIAGNOSIS — I2699 Other pulmonary embolism without acute cor pulmonale: Secondary | ICD-10-CM | POA: Diagnosis not present

## 2023-05-06 NOTE — Assessment & Plan Note (Signed)
Chronic abuse of 1/2 pack/day recalcitrant to risk factor modification.

## 2023-05-06 NOTE — Assessment & Plan Note (Signed)
History of peripheral arterial disease with Doppler studies performed 03/08/2023 revealing right ABI of 0.84 with what appears to be potential thrombus in her right popliteal artery and TP trunk.  Left lower extremity is without disease.  She does have some claudication right greater than left.  This is somewhat improved on Pletal.  She does not think this is lifestyle limiting of to warrant an intervention at this time.

## 2023-05-06 NOTE — Assessment & Plan Note (Signed)
History of pulmonary embolism in the past currently on Eliquis oral anticoagulation.

## 2023-05-06 NOTE — Progress Notes (Signed)
05/06/2023 Vanessa Campos   [birth year]  [id number]  Primary Physician Vanessa Belling, PA-C Primary Cardiologist: Vanessa Gess MD Vanessa Campos, [place]  HPI:  Vanessa Campos is a 72 y.o.   single African-American female mother of 2 children, grandmother of 5 grandchildren who is accompanied by one of her granddaughters, Vanessa Campos.  She is retired in 2015 from working for the city of [employer] doing [employer]or and [employer], fairly labor-intensive work.  She was referred by her podiatrist, Dr. Fanny Campos, for peripheral vascular evaluation and clearance before anticipated foot surgery.  I last saw her in the office 01/29/2023.  Her risk factors include 25 pack years tobacco abuse having smoked 1/2 pack a day for last 50 years, and recalcitrant to resector modification.  She has treated hyperlipidemia, and possibly hypertension.  She is not diabetic.  She does have COPD.  Several siblings have had heart attacks and stents.  She is never had a stroke.  She did have a non-STEMI/8/20 and underwent cardiac catheterization by Dr. Herbie Campos demonstrating distal LAD and circumflex disease suggestive of "SCAD ".  Medical therapy was recommended at that time without intervention.  She has had DVTs and pulmonary emboli on Eliquis oral anticoagulation.  She is complain of symmetric Claudication for last 4 years which does not necessarily affect her ability to perform her activities of daily living.  Since I saw her 3 months ago I did begin her on which afforded her some mild relief from her lower extremity discomfort right greater than left.  Lower extremity arterial Doppler studies performed 03/08/2023 revealed a right ABI of 0.84 with what appears to be an occluded right tibioperoneal trunk and a left ABI of 1.07 without obstructive disease.  She denies chest pain or shortness of breath.   Current Meds  Medication Sig   acetaminophen (TYLENOL) 325 MG tablet Take 650 mg by mouth every 6 (six) hours as needed  (for pain.).   apixaban (ELIQUIS) 2.5 MG TABS tablet Take 2.5 mg by mouth 2 (two) times daily.   aspirin EC 81 MG EC tablet Take 1 tablet (81 mg total) by mouth daily.   atorvastatin (LIPITOR) 40 MG tablet Take 1 tablet (40 mg total) by mouth daily at 6 PM.   buPROPion (WELLBUTRIN SR) 150 MG 12 hr tablet Take 150 mg by mouth 2 (two) times daily.   cilostazol (PLETAL) 50 MG tablet Take 1 tablet (50 mg total) by mouth 2 (two) times daily.   clopidogrel (PLAVIX) 75 MG tablet Take 1 tablet (75 mg total) by mouth daily with breakfast.   diclofenac sodium (VOLTAREN) 1 % GEL Apply 2 g topically 4 (four) times daily as needed. (Patient taking differently: Apply 2 g topically 4 (four) times daily as needed. Pain.)   ezetimibe (ZETIA) 10 MG tablet Take 10 mg by mouth daily.   gabapentin (NEURONTIN) 100 MG capsule Take 100 mg by mouth at bedtime.   ibuprofen (ADVIL) 200 MG tablet Take 200 mg by mouth as needed for mild pain.   isosorbide mononitrate (IMDUR) 30 MG 24 hr tablet Take 0.5 tablet (15 mg) by mouth for 4 days, if BP okay increase to 1 tablet (30 mg)   Lidocaine 0.5 % GEL 4 %.   methocarbamol (ROBAXIN) 500 MG tablet Take 1 tablet (500 mg total) by mouth 2 (two) times daily.   metoprolol succinate (TOPROL-XL) 25 MG 24 hr tablet Take 25 mg by mouth daily.   nitroGLYCERIN (NITROSTAT) 0.4 MG SL  tablet Place 1 tablet (0.4 mg total) under the tongue every 5 (five) minutes x 3 doses as needed for chest pain.   polyethylene glycol powder (GLYCOLAX/MIRALAX) 17 GM/SCOOP powder Take 0.5 Containers by mouth as needed for mild constipation, moderate constipation or severe constipation.   Vitamin E 45 MG (100 UNIT) CAPS Take 200 Units by mouth daily.     Allergies  Allergen Reactions   Tramadol     shaking   Vicodin [Hydrocodone-Acetaminophen] Other (See Comments)    Shaking     Social History   Socioeconomic History   Marital status: Single    Spouse name: Not on file   Number of children: Not on  file   Years of education: Not on file   Highest education level: Not on file  Occupational History   Not on file  Tobacco Use   Smoking status: Every Day    Current packs/day: 1.00    Average packs/day: 1 pack/day for 40.0 years (40.0 ttl pk-yrs)    Types: Cigarettes   Smokeless tobacco: Never  Vaping Use   Vaping status: Never Used  Substance and Sexual Activity   Alcohol use: No    Alcohol/week: 0.0 standard drinks of alcohol   Drug use: No   Sexual activity: Not on file  Other Topics Concern   Not on file  Social History Narrative   Not on file   Social Determinants of Health   Financial Resource Strain: Low Risk  (11/03/2018)   Overall Financial Resource Strain (CARDIA)    Difficulty of Paying Living Expenses: Not hard at all  Food Insecurity: Low Risk  (10/11/2022)   [facility]   Hunger Vital Sign    Worried About Running Out of Food in the Last Year: Never true    Ran Out of Food in the Last Year: Never true  Transportation Needs: Not on file (10/11/2022)  Physical Activity: Not on file  Stress: Not on file  Social Connections: Not on file  Intimate Partner Violence: Not on file     Review of Systems: General: negative for chills, fever, night sweats or weight changes.  Cardiovascular: negative for chest pain, dyspnea on exertion, edema, orthopnea, palpitations, paroxysmal nocturnal dyspnea or shortness of breath Dermatological: negative for rash Respiratory: negative for cough or wheezing Urologic: negative for hematuria Abdominal: negative for nausea, vomiting, diarrhea, bright red blood per rectum, melena, or hematemesis Neurologic: negative for visual changes, syncope, or dizziness All other systems reviewed and are otherwise negative except as noted above.    Blood pressure 124/72, pulse 62, height 5\' 3"  (1.6 m), weight 145 lb 6.4 oz (66 kg), SpO2 99%.  General appearance: alert and no distress Neck: no adenopathy, no  carotid bruit, no JVD, supple, symmetrical, trachea midline, and thyroid not enlarged, symmetric, no tenderness/mass/nodules Lungs: clear to auscultation bilaterally Heart: regular rate and rhythm, S1, S2 normal, no murmur, click, rub or gallop Extremities: extremities normal, atraumatic, no cyanosis or edema Pulses: 2+ and symmetric Skin: Skin color, texture, turgor normal. No rashes or lesions Neurologic: Grossly normal  EKG not performed today      ASSESSMENT AND PLAN:   Tobacco use disorder Chronic abuse of 1/2 pack/day recalcitrant to risk factor modification.  Pulmonary embolism on right [facility]) History of pulmonary embolism in the past currently on Eliquis oral anticoagulation.  Peripheral arterial disease (HCC) History of peripheral arterial disease with Doppler studies performed 03/08/2023 revealing right ABI of 0.84 with what appears to  be potential thrombus in her right popliteal artery and TP trunk.  Left lower extremity is without disease.  She does have some claudication right greater than left.  This is somewhat improved on Pletal.  She does not think this is lifestyle limiting of to warrant an intervention at this time.     Vanessa Gess MD FACP,FACC,FAHA, [facility] 05/06/2023 12:16 PM

## 2023-05-06 NOTE — Patient Instructions (Signed)

## 2023-07-03 ENCOUNTER — Other Ambulatory Visit: Payer: Self-pay

## 2023-07-03 MED ORDER — CILOSTAZOL 50 MG PO TABS: 50.0000 mg | ORAL_TABLET | Freq: Two times a day (BID) | ORAL | 3 refills | Status: AC
